# Patient Record
Sex: Female | Born: 1970 | Race: Black or African American | Hispanic: No | Marital: Married | Smoking: Former smoker
Health system: Southern US, Community
[De-identification: ages and names within clinical notes are randomized; demographics above are authoritative.]

## PROBLEM LIST (undated history)

## (undated) DIAGNOSIS — E785 Hyperlipidemia, unspecified: Secondary | ICD-10-CM

## (undated) DIAGNOSIS — I10 Essential (primary) hypertension: Secondary | ICD-10-CM

## (undated) DIAGNOSIS — T7840XA Allergy, unspecified, initial encounter: Secondary | ICD-10-CM

## (undated) DIAGNOSIS — E1140 Type 2 diabetes mellitus with diabetic neuropathy, unspecified: Secondary | ICD-10-CM

## (undated) DIAGNOSIS — N183 Chronic kidney disease, stage 3 unspecified: Secondary | ICD-10-CM

## (undated) DIAGNOSIS — R519 Headache, unspecified: Secondary | ICD-10-CM

## (undated) DIAGNOSIS — J189 Pneumonia, unspecified organism: Secondary | ICD-10-CM

## (undated) DIAGNOSIS — R51 Headache: Secondary | ICD-10-CM

## (undated) DIAGNOSIS — J45909 Unspecified asthma, uncomplicated: Secondary | ICD-10-CM

## (undated) DIAGNOSIS — M1990 Unspecified osteoarthritis, unspecified site: Secondary | ICD-10-CM

## (undated) DIAGNOSIS — F32A Depression, unspecified: Secondary | ICD-10-CM

## (undated) DIAGNOSIS — N289 Disorder of kidney and ureter, unspecified: Secondary | ICD-10-CM

## (undated) DIAGNOSIS — E119 Type 2 diabetes mellitus without complications: Secondary | ICD-10-CM

## (undated) DIAGNOSIS — G510 Bell's palsy: Secondary | ICD-10-CM

## (undated) DIAGNOSIS — F419 Anxiety disorder, unspecified: Secondary | ICD-10-CM

## (undated) DIAGNOSIS — F329 Major depressive disorder, single episode, unspecified: Secondary | ICD-10-CM

## (undated) HISTORY — PX: ECTOPIC PREGNANCY SURGERY: SHX613

## (undated) HISTORY — PX: TUBAL LIGATION: SHX77

## (undated) HISTORY — DX: Depression, unspecified: F32.A

## (undated) HISTORY — DX: Disorder of kidney and ureter, unspecified: N28.9

## (undated) HISTORY — DX: Major depressive disorder, single episode, unspecified: F32.9

## (undated) HISTORY — DX: Unspecified osteoarthritis, unspecified site: M19.90

## (undated) HISTORY — DX: Allergy, unspecified, initial encounter: T78.40XA

## (undated) HISTORY — DX: Unspecified asthma, uncomplicated: J45.909

## (undated) HISTORY — PX: OVARIAN CYST REMOVAL: SHX89

## (undated) HISTORY — DX: Hyperlipidemia, unspecified: E78.5

## (undated) HISTORY — DX: Bell's palsy: G51.0

## (undated) HISTORY — DX: Anxiety disorder, unspecified: F41.9

---

## 2010-01-03 DIAGNOSIS — G510 Bell's palsy: Secondary | ICD-10-CM

## 2010-01-03 HISTORY — DX: Bell's palsy: G51.0

## 2012-01-09 ENCOUNTER — Emergency Department (HOSPITAL_COMMUNITY)
Admission: EM | Admit: 2012-01-09 | Discharge: 2012-01-09 | Disposition: A | Discharge status: Home / Self Care | Payer: 59 | Attending: Emergency Medicine | Admitting: Emergency Medicine

## 2012-01-09 ENCOUNTER — Encounter (HOSPITAL_COMMUNITY): Payer: Self-pay | Admitting: Cardiology

## 2012-01-09 ENCOUNTER — Emergency Department (HOSPITAL_COMMUNITY): Payer: 59

## 2012-01-09 DIAGNOSIS — R0789 Other chest pain: Secondary | ICD-10-CM | POA: Insufficient documentation

## 2012-01-09 DIAGNOSIS — Z79899 Other long term (current) drug therapy: Secondary | ICD-10-CM | POA: Insufficient documentation

## 2012-01-09 DIAGNOSIS — K5909 Other constipation: Secondary | ICD-10-CM | POA: Insufficient documentation

## 2012-01-09 DIAGNOSIS — F17200 Nicotine dependence, unspecified, uncomplicated: Secondary | ICD-10-CM | POA: Insufficient documentation

## 2012-01-09 DIAGNOSIS — I10 Essential (primary) hypertension: Secondary | ICD-10-CM | POA: Insufficient documentation

## 2012-01-09 DIAGNOSIS — Z3202 Encounter for pregnancy test, result negative: Secondary | ICD-10-CM | POA: Insufficient documentation

## 2012-01-09 DIAGNOSIS — R110 Nausea: Secondary | ICD-10-CM | POA: Insufficient documentation

## 2012-01-09 DIAGNOSIS — E119 Type 2 diabetes mellitus without complications: Secondary | ICD-10-CM | POA: Insufficient documentation

## 2012-01-09 DIAGNOSIS — R109 Unspecified abdominal pain: Secondary | ICD-10-CM

## 2012-01-09 HISTORY — DX: Type 2 diabetes mellitus without complications: E11.9

## 2012-01-09 HISTORY — DX: Essential (primary) hypertension: I10

## 2012-01-09 LAB — URINALYSIS, ROUTINE W REFLEX MICROSCOPIC
Bilirubin Urine: NEGATIVE
Ketones, ur: NEGATIVE mg/dL
Nitrite: NEGATIVE
Protein, ur: NEGATIVE mg/dL
Urobilinogen, UA: 0.2 mg/dL (ref 0.0–1.0)

## 2012-01-09 LAB — CBC
MCH: 28.1 pg (ref 26.0–34.0)
MCV: 83.4 fL (ref 78.0–100.0)
Platelets: 332 10*3/uL (ref 150–400)
RBC: 4.41 MIL/uL (ref 3.87–5.11)

## 2012-01-09 LAB — HEPATIC FUNCTION PANEL
ALT: 10 U/L (ref 0–35)
Alkaline Phosphatase: 72 U/L (ref 39–117)
Bilirubin, Direct: <0.1 mg/dL (ref 0.0–0.3)

## 2012-01-09 LAB — BASIC METABOLIC PANEL
CO2: 27 mEq/L (ref 19–32)
Calcium: 9.8 mg/dL (ref 8.4–10.5)
Creatinine, Ser: 1.2 mg/dL — ABNORMAL HIGH (ref 0.50–1.10)
Glucose, Bld: 145 mg/dL — ABNORMAL HIGH (ref 70–99)

## 2012-01-09 LAB — LIPASE, BLOOD: Lipase: 36 U/L (ref 11–59)

## 2012-01-09 MED ORDER — OMEPRAZOLE 20 MG PO CPDR: 20.0000 mg | DELAYED_RELEASE_CAPSULE | Freq: Every day | ORAL | Status: DC

## 2012-01-09 MED ORDER — GI COCKTAIL ~~LOC~~
30.0000 mL | Freq: Once | ORAL | Status: AC
  Administered 2012-01-09: 30 mL via ORAL

## 2012-01-09 MED ORDER — SODIUM CHLORIDE 0.9 % IV BOLUS (SEPSIS)
1000.0000 mL | Freq: Once | INTRAVENOUS | Status: AC
  Administered 2012-01-09: 1000 mL via INTRAVENOUS

## 2012-01-09 MED ORDER — PANTOPRAZOLE SODIUM 40 MG IV SOLR
40.0000 mg | Freq: Once | INTRAVENOUS | Status: AC
  Administered 2012-01-09: 40 mg via INTRAVENOUS
  Filled 2012-01-09: qty 40

## 2012-01-09 NOTE — ED Provider Notes (Signed)
History     CSN: [id number]  Arrival date & time 01/09/12  1318   First MD Initiated Contact with Patient 01/09/12 1738      Chief Complaint  Patient presents with  . Abdominal Pain    (Consider location/radiation/quality/duration/timing/severity/associated sxs/prior treatment) HPI  Renee James is a 42 y.o. female complaining of burning sensation in lower left chest onset yesterday, happening intermittently, with nausea. Denies fever, patient has chronic constipation she took Dulcolax 2 days ago has not had a bowel movement since that time. She denies fever, emesis, shortness of breath, palpitations, change in bladder habits.   Past Medical History  Diagnosis Date  . Hypertension   . Diabetes mellitus without complication     History reviewed. No pertinent past surgical history.  History reviewed. No pertinent family history.  History  Substance Use Topics  . Smoking status: Current Every Day Smoker  . Smokeless tobacco: Not on file  . Alcohol Use: Yes    OB History    Grav Para Term Preterm Abortions TAB SAB Ect Mult Living                  Review of Systems  Constitutional: Negative for fever.  Respiratory: Negative for shortness of breath.   Cardiovascular: Negative for chest pain.  Gastrointestinal: Negative for nausea, vomiting, abdominal pain and diarrhea.  All other systems reviewed and are negative.    Allergies  Latex; Ace inhibitors; and Tylox  Home Medications   Current Outpatient Rx  Name  Route  Sig  Dispense  Refill  . LIPITOR PO   Oral   Take 1 tablet by mouth daily.         Marland Kitchen LAXATIVE PO   Oral   Take 4 capsules by mouth daily as needed. For stomach and intestine discomfort.         Marland Kitchen CITALOPRAM HYDROBROMIDE 10 MG PO TABS   Oral   Take 10 mg by mouth daily.         Marland Kitchen GLIMEPIRIDE 1 MG PO TABS   Oral   Take 1 mg by mouth daily.         Marland Kitchen HYDROCHLOROTHIAZIDE 25 MG PO TABS   Oral   Take 25 mg by mouth daily.         .  IBUPROFEN 800 MG PO TABS   Oral   Take 800 mg by mouth every 8 (eight) hours as needed. For headache         . LOSARTAN POTASSIUM 100 MG PO TABS   Oral   Take 100 mg by mouth daily.         Marland Kitchen METFORMIN HCL 500 MG PO TABS   Oral   Take 1,000 mg by mouth daily.           BP 106/50  Pulse 61  Temp 98.9 F (37.2 C) (Oral)  Resp 18  SpO2 99%  Physical Exam  Nursing note and vitals reviewed. Constitutional: She is oriented to person, place, and time. She appears well-developed and well-nourished. No distress.  HENT:  Head: Normocephalic and atraumatic.  Mouth/Throat: Oropharynx is clear and moist.  Eyes: Conjunctivae normal and EOM are normal. Pupils are equal, round, and reactive to light.  Neck: Normal range of motion.  Cardiovascular: Normal rate, regular rhythm and intact distal pulses.   Pulmonary/Chest: Effort normal and breath sounds normal. No stridor. No respiratory distress. She has no wheezes. She has no rales. She exhibits tenderness.  Mild tenderness to palpation of left lower anterior chest.  Abdominal: Soft. Bowel sounds are normal. She exhibits no distension and no mass. There is no tenderness. There is no rebound and no guarding.  Musculoskeletal: Normal range of motion.  Neurological: She is alert and oriented to person, place, and time.  Skin: Skin is warm.  Psychiatric: She has a normal mood and affect.    ED Course  Procedures (including critical care time)  Labs Reviewed  CBC - Abnormal; Notable for the following:    WBC 11.6 (*)     All other components within normal limits  BASIC METABOLIC PANEL - Abnormal; Notable for the following:    Glucose, Bld 145 (*)     Creatinine, Ser 1.20 (*)     GFR calc non Af Amer 55 (*)     GFR calc Af Amer 63 (*)     All other components within normal limits   Dg Abd Acute W/chest  01/09/2012  *RADIOLOGY REPORT*  Clinical Data: Mid chest pain, left upper quadrant discomfort.  ACUTE ABDOMEN SERIES  (ABDOMEN 2 VIEW & CHEST 1 VIEW)  Comparison:  None.  Findings:  There is no evidence of dilated bowel loops or free intraperitoneal air.  No radiopaque calculi or other significant radiographic abnormality is seen. Heart size and mediastinal contours are within normal limits.  Both lungs are clear.  IMPRESSION: Negative abdominal radiographs.  No acute cardiopulmonary disease.   Original Report Authenticated By: Davonna Belling, M.D.     Date: 01/09/2012  Rate: 65  Rhythm: normal sinus rhythm  QRS Axis: normal  Intervals: normal  ST/T Wave abnormalities: normal  Conduction Disutrbances:none  Narrative Interpretation:   Old EKG Reviewed: unchanged  1. Abdominal pain       MDM  Doubt ACS based on history of present illness and length of symptoms. EKG is nonischemic and troponin is negative. D-dimer is negative and blood work and urinalysis are also normal. Patient has complete resolution of her symptoms after Protonix and GI cocktail. VSS.    Pt verbalized understanding and agrees with care plan. Outpatient follow-up and return precautions given.    New Prescriptions   OMEPRAZOLE (PRILOSEC) 20 MG CAPSULE    Take 1 capsule (20 mg total) by mouth daily.         Wynetta Emery, PA-C 01/10/12 1214

## 2012-01-09 NOTE — ED Notes (Signed)
Pt reports pain under the left breast that started yesterday that is non-radiating. States she has been taking tums for the pain, without much relief. Denies any SOB, or n/v.

## 2012-01-11 NOTE — ED Provider Notes (Signed)
Medical screening examination/treatment/procedure(s) were performed by non-physician practitioner and as supervising physician I was immediately available for consultation/collaboration.   Laray Anger, DO 01/11/12 1051

## 2012-03-05 ENCOUNTER — Other Ambulatory Visit: Payer: Self-pay | Admitting: Family Medicine

## 2012-03-05 DIAGNOSIS — Z1231 Encounter for screening mammogram for malignant neoplasm of breast: Secondary | ICD-10-CM

## 2012-03-20 ENCOUNTER — Ambulatory Visit: Payer: 59

## 2012-03-22 ENCOUNTER — Ambulatory Visit
Admission: RE | Admit: 2012-03-22 | Discharge: 2012-03-22 | Disposition: A | Discharge status: Home / Self Care | Payer: 59 | Source: Ambulatory Visit | Attending: Family Medicine | Admitting: Family Medicine

## 2012-03-22 DIAGNOSIS — Z1231 Encounter for screening mammogram for malignant neoplasm of breast: Secondary | ICD-10-CM

## 2012-07-23 ENCOUNTER — Encounter (HOSPITAL_BASED_OUTPATIENT_CLINIC_OR_DEPARTMENT_OTHER): Payer: Self-pay | Admitting: Family Medicine

## 2012-07-23 ENCOUNTER — Emergency Department (HOSPITAL_BASED_OUTPATIENT_CLINIC_OR_DEPARTMENT_OTHER)
Admission: EM | Admit: 2012-07-23 | Discharge: 2012-07-23 | Disposition: A | Discharge status: Home / Self Care | Payer: 59 | Attending: Emergency Medicine | Admitting: Emergency Medicine

## 2012-07-23 DIAGNOSIS — Z91040 Latex allergy status: Secondary | ICD-10-CM | POA: Insufficient documentation

## 2012-07-23 DIAGNOSIS — Z79899 Other long term (current) drug therapy: Secondary | ICD-10-CM | POA: Insufficient documentation

## 2012-07-23 DIAGNOSIS — I10 Essential (primary) hypertension: Secondary | ICD-10-CM | POA: Insufficient documentation

## 2012-07-23 DIAGNOSIS — E119 Type 2 diabetes mellitus without complications: Secondary | ICD-10-CM | POA: Insufficient documentation

## 2012-07-23 DIAGNOSIS — F17200 Nicotine dependence, unspecified, uncomplicated: Secondary | ICD-10-CM | POA: Insufficient documentation

## 2012-07-23 DIAGNOSIS — B86 Scabies: Secondary | ICD-10-CM | POA: Insufficient documentation

## 2012-07-23 DIAGNOSIS — L509 Urticaria, unspecified: Secondary | ICD-10-CM | POA: Insufficient documentation

## 2012-07-23 MED ORDER — PREDNISONE 20 MG PO TABS: ORAL_TABLET | ORAL | Status: DC

## 2012-07-23 MED ORDER — PREDNISONE 50 MG PO TABS
60.0000 mg | ORAL_TABLET | Freq: Once | ORAL | Status: AC
  Administered 2012-07-23: 60 mg via ORAL
  Filled 2012-07-23: qty 1

## 2012-07-23 MED ORDER — PERMETHRIN 5 % EX CREA: TOPICAL_CREAM | CUTANEOUS | Status: DC

## 2012-07-23 MED ORDER — HYDROXYZINE HCL 25 MG PO TABS: 25.0000 mg | ORAL_TABLET | Freq: Four times a day (QID) | ORAL | Status: DC

## 2012-07-23 NOTE — ED Provider Notes (Signed)
History    This chart was scribed for a non-physician practitioner working with Rolan Bucco, MD by Jiles Prows, ED scribe. This patient was seen in room MH07/MH07 and the patient's care was started at 3:24 PM.  CSN: [id number] Arrival date & time 07/23/12  1502   Chief Complaint  Patient presents with  . Urticaria   Patient is a 42 y.o. female presenting with urticaria. The history is provided by the patient, medical records and the spouse. No language interpreter was used.  Urticaria This is a new problem. The current episode started 6 to 12 hours ago. The problem occurs constantly. The problem has been gradually worsening. Pertinent negatives include no chest pain, no abdominal pain, no headaches and no shortness of breath. The treatment provided no relief.   HPI Comments: Renee James is a 42 y.o. female who presents to the Emergency Department complaining of sudden, numerous, small insect bites to arms, hands, and ear lobes onset Thursday.  She reports hives appeared today over torso, feet, and arms today.  Pt reports benadryl has offered no relief.  Pt states that she recently travelled to [place], [place], and [place].  She claims the bumps appeared while she was in [place].  Pt denies headache, diaphoresis, fever, chills, nausea, vomiting, diarrhea, weakness, cough, SOB and any other pain.  Husband denies symptoms.  She claims allergy to latex and states she has had reactions in the past due to this irritant.  Past Medical History  Diagnosis Date  . Hypertension   . Diabetes mellitus without complication    History reviewed. No pertinent past surgical history. No family history on file. History  Substance Use Topics  . Smoking status: Current Every Day Smoker  . Smokeless tobacco: Not on file  . Alcohol Use: Yes   OB History   Grav Para Term Preterm Abortions TAB SAB Ect Mult Living                 Review of Systems  Constitutional: Negative for fever, chills, diaphoresis  and fatigue.  HENT: Negative for congestion, rhinorrhea and sneezing.   Eyes: Negative.   Respiratory: Negative for cough, chest tightness and shortness of breath.   Cardiovascular: Negative for chest pain and leg swelling.  Gastrointestinal: Negative for nausea, vomiting, abdominal pain, diarrhea and blood in stool.  Genitourinary: Negative for frequency, hematuria, flank pain and difficulty urinating.  Musculoskeletal: Negative for back pain and arthralgias.  Skin: Positive for rash.  Neurological: Negative for dizziness, speech difficulty, weakness, numbness and headaches.  All other systems reviewed and are negative.    Allergies  Latex; Ace inhibitors; and Tylox  Home Medications   Current Outpatient Rx  Name  Route  Sig  Dispense  Refill  . Atorvastatin Calcium (LIPITOR PO)   Oral   Take 1 tablet by mouth daily.         . Bisacodyl (LAXATIVE PO)   Oral   Take 4 capsules by mouth daily as needed. For stomach and intestine discomfort.         . citalopram (CELEXA) 10 MG tablet   Oral   Take 10 mg by mouth daily.         Marland Kitchen glimepiride (AMARYL) 1 MG tablet   Oral   Take 1 mg by mouth daily.         . hydrochlorothiazide (HYDRODIURIL) 25 MG tablet   Oral   Take 25 mg by mouth daily.         Marland Kitchen  hydrOXYzine (ATARAX/VISTARIL) 25 MG tablet   Oral   Take 1 tablet (25 mg total) by mouth every 6 (six) hours.   12 tablet   0   . ibuprofen (ADVIL,MOTRIN) 800 MG tablet   Oral   Take 800 mg by mouth every 8 (eight) hours as needed. For headache         . losartan (COZAAR) 100 MG tablet   Oral   Take 100 mg by mouth daily.         . metFORMIN (GLUCOPHAGE) 500 MG tablet   Oral   Take 1,000 mg by mouth daily.         Marland Kitchen omeprazole (PRILOSEC) 20 MG capsule   Oral   Take 1 capsule (20 mg total) by mouth daily.   20 capsule   0   . permethrin (ELIMITE) 5 % cream      Apply to affected area once   60 g   0   . predniSONE (DELTASONE) 20 MG tablet       3 tabs po day one, then 2 po daily x 4 days   11 tablet   0    BP 116/92  Pulse 94  Temp(Src) 98.8 F (37.1 C) (Oral)  Resp 16  Ht 5\' 3"  (1.6 m)  Wt 195 lb (88.451 kg)  BMI 34.55 kg/m2  SpO2 100%  LMP 07/09/2012 Physical Exam  Constitutional: She is oriented to person, place, and time. She appears well-developed and well-nourished.  HENT:  Head: Normocephalic and atraumatic.  Mouth/Throat: Oropharynx is clear and moist.  Eyes: Pupils are equal, round, and reactive to light.  Neck: Normal range of motion. Neck supple.  Cardiovascular: Normal rate, regular rhythm and normal heart sounds.   Pulmonary/Chest: Effort normal and breath sounds normal. No respiratory distress. She has no wheezes. She has no rales. She exhibits no tenderness.  Abdominal: Soft. Bowel sounds are normal. There is no tenderness. There is no rebound and no guarding.  Musculoskeletal: Normal range of motion. She exhibits no edema.  Lymphadenopathy:    She has no cervical adenopathy.  Neurological: She is alert and oriented to person, place, and time.  Skin: Skin is warm and dry. Rash noted.  Small papules to dorsum of hands, forearms.  +blanching hives to arms/trunck.  No petechiae or purpura, no vesicles.  Psychiatric: She has a normal mood and affect.    ED Course  Procedures (including critical care time) DIAGNOSTIC STUDIES: Oxygen Saturation is 100% on RA, normal by my interpretation.    COORDINATION OF CARE: 3:27 PM - Discussed ED treatment with pt at bedside including prednisone treatment, scabies medication, and Atarax and pt agrees.     Labs Reviewed - No data to display No results found. 1. Urticaria   2. Scabies     MDM  Pt with small papules that look concerning for scabies.   Will treat with elimite. Advised to wash clothes/bedsheets.  Also with urticaria.  No angioedema or respiratory difficulty.  No fevers or other systemic dz.  No new meds.  Will tx with prednisone, atarax.   Advised to monitor her blood sugars while on prednisone  I personally performed the services described in this documentation, which was scribed in my presence.  The recorded information has been reviewed and considered.    Rolan Bucco, MD 07/23/12 [phone]

## 2012-07-23 NOTE — ED Notes (Signed)
Pt c/o hives all over x 2 days.

## 2013-01-04 ENCOUNTER — Encounter (HOSPITAL_BASED_OUTPATIENT_CLINIC_OR_DEPARTMENT_OTHER): Payer: Self-pay | Admitting: Emergency Medicine

## 2013-01-04 DIAGNOSIS — I10 Essential (primary) hypertension: Secondary | ICD-10-CM | POA: Insufficient documentation

## 2013-01-04 DIAGNOSIS — L299 Pruritus, unspecified: Secondary | ICD-10-CM | POA: Insufficient documentation

## 2013-01-04 DIAGNOSIS — E119 Type 2 diabetes mellitus without complications: Secondary | ICD-10-CM | POA: Insufficient documentation

## 2013-01-04 DIAGNOSIS — Z79899 Other long term (current) drug therapy: Secondary | ICD-10-CM | POA: Insufficient documentation

## 2013-01-04 DIAGNOSIS — Z91040 Latex allergy status: Secondary | ICD-10-CM | POA: Insufficient documentation

## 2013-01-04 DIAGNOSIS — F17200 Nicotine dependence, unspecified, uncomplicated: Secondary | ICD-10-CM | POA: Insufficient documentation

## 2013-01-04 MED ORDER — DIPHENHYDRAMINE HCL 25 MG PO CAPS
ORAL_CAPSULE | ORAL | Status: AC
  Administered 2013-01-04: 50 mg via ORAL
  Filled 2013-01-04: qty 2

## 2013-01-04 MED ORDER — DIPHENHYDRAMINE HCL 25 MG PO CAPS
50.0000 mg | ORAL_CAPSULE | Freq: Once | ORAL | Status: AC
  Administered 2013-01-04: 50 mg via ORAL

## 2013-01-04 NOTE — ED Notes (Signed)
Hives since yesterday. Last dose of Benadryl was at noon. Denies difficulty breathing or sore throat.

## 2013-01-05 ENCOUNTER — Emergency Department (HOSPITAL_BASED_OUTPATIENT_CLINIC_OR_DEPARTMENT_OTHER)
Admission: EM | Admit: 2013-01-05 | Discharge: 2013-01-05 | Disposition: A | Discharge status: Home / Self Care | Payer: 59 | Attending: Emergency Medicine | Admitting: Emergency Medicine

## 2013-01-05 DIAGNOSIS — T7840XA Allergy, unspecified, initial encounter: Secondary | ICD-10-CM

## 2013-01-05 MED ORDER — PREDNISONE 20 MG PO TABS: ORAL_TABLET | ORAL | Status: DC

## 2013-01-05 MED ORDER — PREDNISONE 50 MG PO TABS
60.0000 mg | ORAL_TABLET | Freq: Once | ORAL | Status: AC
  Administered 2013-01-05: 60 mg via ORAL
  Filled 2013-01-05 (×2): qty 1

## 2013-01-05 MED ORDER — FAMOTIDINE 20 MG PO TABS
20.0000 mg | ORAL_TABLET | Freq: Once | ORAL | Status: AC
  Administered 2013-01-05: 20 mg via ORAL
  Filled 2013-01-05: qty 1

## 2013-01-05 NOTE — Discharge Instructions (Signed)
Allergies °Allergies may happen from anything your body is sensitive to. This may be food, medicines, pollens, chemicals, and nearly anything around you in everyday life that produces allergens. An allergen is anything that causes an allergy producing substance. Heredity is often a factor in causing these problems. This means you may have some of the same allergies as your parents. °Food allergies happen in all age groups. Food allergies are some of the most severe and life threatening. Some common food allergies are cow's milk, seafood, eggs, nuts, wheat, and soybeans. °SYMPTOMS  °· Swelling around the mouth. °· An itchy red rash or hives. °· Vomiting or diarrhea. °· Difficulty breathing. °SEVERE ALLERGIC REACTIONS ARE LIFE-THREATENING. °This reaction is called anaphylaxis. It can cause the mouth and throat to swell and cause difficulty with breathing and swallowing. In severe reactions only a trace amount of food (for example, peanut oil in a salad) may cause death within seconds. °Seasonal allergies occur in all age groups. These are seasonal because they usually occur during the same season every year. They may be a reaction to molds, grass pollens, or tree pollens. Other causes of problems are house dust mite allergens, pet dander, and mold spores. The symptoms often consist of nasal congestion, a runny itchy nose associated with sneezing, and tearing itchy eyes. There is often an associated itching of the mouth and ears. The problems happen when you come in contact with pollens and other allergens. Allergens are the particles in the air that the body reacts to with an allergic reaction. This causes you to release allergic antibodies. Through a chain of events, these eventually cause you to release histamine into the blood stream. Although it is meant to be protective to the body, it is this release that causes your discomfort. This is why you were given anti-histamines to feel better.  If you are unable to  pinpoint the offending allergen, it may be determined by skin or blood testing. Allergies cannot be cured but can be controlled with medicine. °Hay fever is a collection of all or some of the seasonal allergy problems. It may often be treated with simple over-the-counter medicine such as diphenhydramine. Take medicine as directed. Do not drink alcohol or drive while taking this medicine. Check with your caregiver or package insert for child dosages. °If these medicines are not effective, there are many new medicines your caregiver can prescribe. Stronger medicine such as nasal spray, eye drops, and corticosteroids may be used if the first things you try do not work well. Other treatments such as immunotherapy or desensitizing injections can be used if all else fails. Follow up with your caregiver if problems continue. These seasonal allergies are usually not life threatening. They are generally more of a nuisance that can often be handled using medicine. °HOME CARE INSTRUCTIONS  °· If unsure what causes a reaction, keep a diary of foods eaten and symptoms that follow. Avoid foods that cause reactions. °· If hives or rash are present: °· Take medicine as directed. °· You may use an over-the-counter antihistamine (diphenhydramine) for hives and itching as needed. °· Apply cold compresses (cloths) to the skin or take baths in cool water. Avoid hot baths or showers. Heat will make a rash and itching worse. °· If you are severely allergic: °· Following a treatment for a severe reaction, hospitalization is often required for closer follow-up. °· Wear a medic-alert bracelet or necklace stating the allergy. °· You and your family must learn how to give adrenaline or use   an anaphylaxis kit. °· If you have had a severe reaction, always carry your anaphylaxis kit or EpiPen® with you. Use this medicine as directed by your caregiver if a severe reaction is occurring. Failure to do so could have a fatal outcome. °SEEK MEDICAL  CARE IF: °· You suspect a food allergy. Symptoms generally happen within 30 minutes of eating a food. °· Your symptoms have not gone away within 2 days or are getting worse. °· You develop new symptoms. °· You want to retest yourself or your child with a food or drink you think causes an allergic reaction. Never do this if an anaphylactic reaction to that food or drink has happened before. Only do this under the care of a caregiver. °SEEK IMMEDIATE MEDICAL CARE IF:  °· You have difficulty breathing, are wheezing, or have a tight feeling in your chest or throat. °· You have a swollen mouth, or you have hives, swelling, or itching all over your body. °· You have had a severe reaction that has responded to your anaphylaxis kit or an EpiPen®. These reactions may return when the medicine has worn off. These reactions should be considered life threatening. °MAKE SURE YOU:  °· Understand these instructions. °· Will watch your condition. °· Will get help right away if you are not doing well or get worse. °Document Released: 03/15/2002 Document Revised: 04/16/2012 Document Reviewed: 08/20/2007 °[employer]® Patient Information ©2014 [employer]. ° °

## 2013-01-05 NOTE — ED Provider Notes (Signed)
CSN: [id number]     Arrival date & time 01/04/13  2241 History   First MD Initiated Contact with Patient 01/05/13 0118     Chief Complaint  Patient presents with  . Allergic Reaction   (Consider location/radiation/quality/duration/timing/severity/associated sxs/prior Treatment) HPI Patient has had pruritic rash yesterday and worsening today. This is located on her trunk and extremities. She has no intraoral or facial swelling. She denies any shortness of breath or wheezing. Patient has similar episode 6 months ago associated with any particular type of Gumbo. Patient states she recently had the same type of gumbo prior to the onset of her symptoms. She's taken Benadryl with little improvement. She denies any new detergents or soaps. Past Medical History  Diagnosis Date  . Hypertension   . Diabetes mellitus without complication    History reviewed. No pertinent past surgical history. No family history on file. History  Substance Use Topics  . Smoking status: Current Every Day Smoker  . Smokeless tobacco: Not on file  . Alcohol Use: Yes   OB History   Grav Para Term Preterm Abortions TAB SAB Ect Mult Living                 Review of Systems  Constitutional: Negative for fever and chills.  HENT: Negative for facial swelling, trouble swallowing and voice change.   Respiratory: Negative for shortness of breath, wheezing and stridor.   Cardiovascular: Negative for chest pain.  Gastrointestinal: Negative for nausea, vomiting, abdominal pain, diarrhea and constipation.  Musculoskeletal: Negative for back pain, myalgias, neck pain and neck stiffness.  Skin: Positive for rash. Negative for color change, pallor and wound.  Neurological: Negative for dizziness, syncope, weakness, numbness and headaches.  All other systems reviewed and are negative.    Allergies  Latex; Ace inhibitors; and Tylox  Home Medications   Current Outpatient Rx  Name  Route  Sig  Dispense  Refill  .  Atorvastatin Calcium (LIPITOR PO)   Oral   Take 1 tablet by mouth daily.         . Bisacodyl (LAXATIVE PO)   Oral   Take 4 capsules by mouth daily as needed. For stomach and intestine discomfort.         . citalopram (CELEXA) 10 MG tablet   Oral   Take 10 mg by mouth daily.         Marland Kitchen glimepiride (AMARYL) 1 MG tablet   Oral   Take 1 mg by mouth daily.         . hydrochlorothiazide (HYDRODIURIL) 25 MG tablet   Oral   Take 25 mg by mouth daily.         . hydrOXYzine (ATARAX/VISTARIL) 25 MG tablet   Oral   Take 1 tablet (25 mg total) by mouth every 6 (six) hours.   12 tablet   0   . ibuprofen (ADVIL,MOTRIN) 800 MG tablet   Oral   Take 800 mg by mouth every 8 (eight) hours as needed. For headache         . losartan (COZAAR) 100 MG tablet   Oral   Take 100 mg by mouth daily.         . metFORMIN (GLUCOPHAGE) 500 MG tablet   Oral   Take 1,000 mg by mouth daily.         Marland Kitchen omeprazole (PRILOSEC) 20 MG capsule   Oral   Take 1 capsule (20 mg total) by mouth daily.   20 capsule  0   . permethrin (ELIMITE) 5 % cream      Apply to affected area once   60 g   0   . predniSONE (DELTASONE) 20 MG tablet      3 tabs po day one, then 2 po daily x 4 days   11 tablet   0   . predniSONE (DELTASONE) 20 MG tablet      2 tabs po daily x 4 days   8 tablet   0    BP 130/85  Pulse 88  Temp(Src) 98.6 F (37 C) (Oral)  Resp 18  Ht 5\' 3"  (1.6 m)  Wt 187 lb (84.823 kg)  BMI 33.13 kg/m2  SpO2 99% Physical Exam  Nursing note and vitals reviewed. Constitutional: She is oriented to person, place, and time. She appears well-developed and well-nourished. No distress.  HENT:  Head: Normocephalic and atraumatic.  Mouth/Throat: Oropharynx is clear and moist.  No intraoral swelling.  Eyes: EOM are normal. Pupils are equal, round, and reactive to light.  Neck: Normal range of motion. Neck supple. No tracheal deviation present.  Cardiovascular: Normal rate and  regular rhythm.  Gallop: rash consistent with urticaria to bilateral upper extremities and upper back.   Pulmonary/Chest: Effort normal and breath sounds normal. No stridor. No respiratory distress. She has no wheezes. She has no rales.  Abdominal: Soft. Bowel sounds are normal. She exhibits no distension and no mass. There is no tenderness. There is no rebound and no guarding.  Musculoskeletal: Normal range of motion. She exhibits no edema and no tenderness.  Neurological: She is alert and oriented to person, place, and time.  Patient is alert and oriented x3 with clear, goal oriented speech. Patient has 5/5 motor in all extremities. Sensation is intact to light touch. Patient has a normal gait and walks without assistance.   Skin: Skin is warm and dry. Rash noted. No erythema.  Psychiatric: She has a normal mood and affect. Her behavior is normal.    ED Course  Procedures (including critical care time) Labs Review Labs Reviewed - No data to display Imaging Review No results found.  EKG Interpretation   None       MDM   1. Allergic reaction, initial encounter    Patient continues to be well-appearing in emergency department. We'll give a short course of steroids. She's been advised to avoid this particular type of food and follow with her primary Dr. Return precautions have been given.    Julianne Rice, MD 01/05/13 [phone]

## 2013-07-16 ENCOUNTER — Emergency Department (HOSPITAL_BASED_OUTPATIENT_CLINIC_OR_DEPARTMENT_OTHER)
Admission: EM | Admit: 2013-07-16 | Discharge: 2013-07-16 | Disposition: A | Discharge status: Home / Self Care | Payer: 59 | Attending: Emergency Medicine | Admitting: Emergency Medicine

## 2013-07-16 ENCOUNTER — Emergency Department (HOSPITAL_BASED_OUTPATIENT_CLINIC_OR_DEPARTMENT_OTHER): Payer: 59

## 2013-07-16 ENCOUNTER — Encounter (HOSPITAL_BASED_OUTPATIENT_CLINIC_OR_DEPARTMENT_OTHER): Payer: Self-pay | Admitting: Emergency Medicine

## 2013-07-16 DIAGNOSIS — Z91040 Latex allergy status: Secondary | ICD-10-CM | POA: Insufficient documentation

## 2013-07-16 DIAGNOSIS — IMO0002 Reserved for concepts with insufficient information to code with codable children: Secondary | ICD-10-CM | POA: Insufficient documentation

## 2013-07-16 DIAGNOSIS — J189 Pneumonia, unspecified organism: Secondary | ICD-10-CM

## 2013-07-16 DIAGNOSIS — I10 Essential (primary) hypertension: Secondary | ICD-10-CM | POA: Insufficient documentation

## 2013-07-16 DIAGNOSIS — F17200 Nicotine dependence, unspecified, uncomplicated: Secondary | ICD-10-CM | POA: Insufficient documentation

## 2013-07-16 DIAGNOSIS — J159 Unspecified bacterial pneumonia: Secondary | ICD-10-CM | POA: Insufficient documentation

## 2013-07-16 DIAGNOSIS — Z79899 Other long term (current) drug therapy: Secondary | ICD-10-CM | POA: Insufficient documentation

## 2013-07-16 DIAGNOSIS — E119 Type 2 diabetes mellitus without complications: Secondary | ICD-10-CM | POA: Insufficient documentation

## 2013-07-16 MED ORDER — AZITHROMYCIN 250 MG PO TABS: 250.0000 mg | ORAL_TABLET | Freq: Every day | ORAL | Status: DC

## 2013-07-16 MED ORDER — DEXTROSE 5 % IV SOLN: 1.0000 g | Freq: Once | INTRAVENOUS | Status: DC

## 2013-07-16 MED ORDER — ALBUTEROL SULFATE (2.5 MG/3ML) 0.083% IN NEBU
2.5000 mg | INHALATION_SOLUTION | Freq: Once | RESPIRATORY_TRACT | Status: AC
Start: 2013-07-16 — End: 2013-07-16
  Administered 2013-07-16: 2.5 mg via RESPIRATORY_TRACT
  Filled 2013-07-16: qty 3

## 2013-07-16 MED ORDER — CEFTRIAXONE SODIUM 1 G IJ SOLR
1.0000 g | Freq: Once | INTRAMUSCULAR | Status: AC
  Administered 2013-07-16: 1 g via INTRAMUSCULAR

## 2013-07-16 MED ORDER — ONDANSETRON 4 MG PO TBDP
ORAL_TABLET | ORAL | Status: AC
  Filled 2013-07-16: qty 1

## 2013-07-16 MED ORDER — IPRATROPIUM-ALBUTEROL 0.5-2.5 (3) MG/3ML IN SOLN
3.0000 mL | Freq: Once | RESPIRATORY_TRACT | Status: AC
Start: 2013-07-16 — End: 2013-07-16
  Administered 2013-07-16: 3 mL via RESPIRATORY_TRACT
  Filled 2013-07-16: qty 3

## 2013-07-16 MED ORDER — ALBUTEROL SULFATE HFA 108 (90 BASE) MCG/ACT IN AERS: 1.0000 | INHALATION_SPRAY | Freq: Four times a day (QID) | RESPIRATORY_TRACT | Status: DC | PRN

## 2013-07-16 MED ORDER — SODIUM CHLORIDE 0.9 % IV SOLN: Freq: Once | INTRAVENOUS | Status: DC

## 2013-07-16 MED ORDER — CEFTRIAXONE SODIUM 1 G IJ SOLR
INTRAMUSCULAR | Status: AC
  Filled 2013-07-16: qty 10

## 2013-07-16 MED ORDER — ONDANSETRON 4 MG PO TBDP
4.0000 mg | ORAL_TABLET | Freq: Once | ORAL | Status: AC
  Administered 2013-07-16: 4 mg via ORAL

## 2013-07-16 MED ORDER — AZITHROMYCIN 250 MG PO TABS
500.0000 mg | ORAL_TABLET | Freq: Once | ORAL | Status: AC
  Administered 2013-07-16: 500 mg via ORAL
  Filled 2013-07-16: qty 2

## 2013-07-16 NOTE — ED Notes (Signed)
Patient transported to X-ray 

## 2013-07-16 NOTE — ED Notes (Signed)
Patient states that she had a sore throat on Sunday and then progressively got worse with a cough. Sore all over.

## 2013-07-16 NOTE — ED Provider Notes (Signed)
CSN: [id number]     Arrival date & time 07/16/13  1906 History   First MD Initiated Contact with Patient 07/16/13 1912     Chief Complaint  Patient presents with  . Cough  . Sore Throat     (Consider location/radiation/quality/duration/timing/severity/associated sxs/prior Treatment) Patient is a 43 y.o. female presenting with pharyngitis. The history is provided by the patient. No language interpreter was used.  Sore Throat This is a new problem. The current episode started today. The problem occurs constantly. The problem has been unchanged. Associated symptoms include congestion, coughing and a sore throat. Nothing aggravates the symptoms. She has tried nothing for the symptoms. The treatment provided no relief.    Past Medical History  Diagnosis Date  . Hypertension   . Diabetes mellitus without complication    History reviewed. No pertinent past surgical history. History reviewed. No pertinent family history. History  Substance Use Topics  . Smoking status: Current Every Day Smoker  . Smokeless tobacco: Not on file  . Alcohol Use: Yes   OB History   Grav Para Term Preterm Abortions TAB SAB Ect Mult Living                 Review of Systems  HENT: Positive for congestion and sore throat.   Respiratory: Positive for cough.   All other systems reviewed and are negative.     Allergies  Latex; Ace inhibitors; and Tylox  Home Medications   Prior to Admission medications   Medication Sig Start Date End Date Taking? Authorizing Provider  Atorvastatin Calcium (LIPITOR PO) Take 1 tablet by mouth daily.   Yes Historical Provider, MD  citalopram (CELEXA) 10 MG tablet Take 10 mg by mouth daily.   Yes Historical Provider, MD  hydrochlorothiazide (HYDRODIURIL) 25 MG tablet Take 25 mg by mouth daily.   Yes Historical Provider, MD  ibuprofen (ADVIL,MOTRIN) 800 MG tablet Take 800 mg by mouth every 8 (eight) hours as needed. For headache   Yes Historical Provider, MD  losartan  (COZAAR) 100 MG tablet Take 100 mg by mouth daily.   Yes Historical Provider, MD  metFORMIN (GLUCOPHAGE) 500 MG tablet Take 1,000 mg by mouth daily.   Yes Historical Provider, MD  Bisacodyl (LAXATIVE PO) Take 4 capsules by mouth daily as needed. For stomach and intestine discomfort.    Historical Provider, MD  glimepiride (AMARYL) 1 MG tablet Take 1 mg by mouth daily.    Historical Provider, MD  hydrOXYzine (ATARAX/VISTARIL) 25 MG tablet Take 1 tablet (25 mg total) by mouth every 6 (six) hours. 07/23/12   Malvin Johns, MD  omeprazole (PRILOSEC) 20 MG capsule Take 1 capsule (20 mg total) by mouth daily. 01/09/12   Nicole Pisciotta, PA-C  permethrin (ELIMITE) 5 % cream Apply to affected area once 07/23/12   Malvin Johns, MD  predniSONE (DELTASONE) 20 MG tablet 3 tabs po day one, then 2 po daily x 4 days 07/23/12   Malvin Johns, MD  predniSONE (DELTASONE) 20 MG tablet 2 tabs po daily x 4 days 01/05/13   Julianne Rice, MD   BP 161/94  Pulse 78  Temp(Src) 100.1 F (37.8 C) (Oral)  Resp 20  Ht 5\' 3"  (1.6 m)  Wt 189 lb (85.73 kg)  BMI 33.49 kg/m2  SpO2 98%  LMP 07/16/2013 Physical Exam  Nursing note and vitals reviewed. Constitutional: She is oriented to person, place, and time. She appears well-developed and well-nourished.  HENT:  Head: Normocephalic.  Right Ear: External ear normal.  Mouth/Throat: Oropharynx is clear and moist.  Eyes: Conjunctivae and EOM are normal. Pupils are equal, round, and reactive to light.  Neck: Normal range of motion.  Cardiovascular: Normal rate.   Pulmonary/Chest: She has wheezes. She exhibits tenderness.  Abdominal: Soft.  Musculoskeletal: Normal range of motion.  Neurological: She is alert and oriented to person, place, and time. She has normal reflexes.  Skin: Skin is warm.  Psychiatric: She has a normal mood and affect.    ED Course  Procedures (including critical care time) Labs Review Labs Reviewed - No data to display  Imaging Review Dg Chest  2 View  07/16/2013   CLINICAL DATA:  Cough and fever  EXAM: CHEST  2 VIEW  COMPARISON:  01/09/2012  FINDINGS: Cardiac shadow is mildly enlarged. The lungs are well aerated bilaterally. Minimal atelectatic changes are noted in the right middle lobe along the minor fissure. No sizable effusion is seen.  IMPRESSION: Minimal right middle lobe atelectasis.   Electronically Signed   By: Inez Catalina M.D.   On: 07/16/2013 19:48     EKG Interpretation None      MDM   Final diagnoses:  Community acquired pneumonia    Pt given rocephin and zithromax.     [place], PA-C 07/16/13 2100

## 2013-07-16 NOTE — ED Provider Notes (Signed)
Medical screening examination/treatment/procedure(s) were performed by non-physician practitioner and as supervising physician I was immediately available for consultation/collaboration.   EKG Interpretation None        Merryl Hacker, MD 07/16/13 2354

## 2013-07-16 NOTE — Discharge Instructions (Signed)

## 2013-07-19 ENCOUNTER — Ambulatory Visit: Payer: 59 | Admitting: *Deleted

## 2013-07-23 ENCOUNTER — Emergency Department (HOSPITAL_BASED_OUTPATIENT_CLINIC_OR_DEPARTMENT_OTHER): Payer: 59

## 2013-07-23 ENCOUNTER — Encounter (HOSPITAL_BASED_OUTPATIENT_CLINIC_OR_DEPARTMENT_OTHER): Payer: Self-pay | Admitting: Emergency Medicine

## 2013-07-23 ENCOUNTER — Emergency Department (HOSPITAL_BASED_OUTPATIENT_CLINIC_OR_DEPARTMENT_OTHER)
Admission: EM | Admit: 2013-07-23 | Discharge: 2013-07-23 | Disposition: A | Discharge status: Home / Self Care | Payer: 59 | Attending: Emergency Medicine | Admitting: Emergency Medicine

## 2013-07-23 DIAGNOSIS — M25552 Pain in left hip: Secondary | ICD-10-CM

## 2013-07-23 DIAGNOSIS — Z91040 Latex allergy status: Secondary | ICD-10-CM | POA: Insufficient documentation

## 2013-07-23 DIAGNOSIS — Z8701 Personal history of pneumonia (recurrent): Secondary | ICD-10-CM | POA: Insufficient documentation

## 2013-07-23 DIAGNOSIS — IMO0002 Reserved for concepts with insufficient information to code with codable children: Secondary | ICD-10-CM | POA: Insufficient documentation

## 2013-07-23 DIAGNOSIS — E119 Type 2 diabetes mellitus without complications: Secondary | ICD-10-CM | POA: Insufficient documentation

## 2013-07-23 DIAGNOSIS — I10 Essential (primary) hypertension: Secondary | ICD-10-CM | POA: Insufficient documentation

## 2013-07-23 DIAGNOSIS — Z792 Long term (current) use of antibiotics: Secondary | ICD-10-CM | POA: Insufficient documentation

## 2013-07-23 DIAGNOSIS — M5410 Radiculopathy, site unspecified: Secondary | ICD-10-CM

## 2013-07-23 DIAGNOSIS — M25559 Pain in unspecified hip: Secondary | ICD-10-CM | POA: Insufficient documentation

## 2013-07-23 DIAGNOSIS — F17200 Nicotine dependence, unspecified, uncomplicated: Secondary | ICD-10-CM | POA: Insufficient documentation

## 2013-07-23 HISTORY — DX: Pneumonia, unspecified organism: J18.9

## 2013-07-23 MED ORDER — DIAZEPAM 5 MG PO TABS: 5.0000 mg | ORAL_TABLET | Freq: Two times a day (BID) | ORAL | Status: DC

## 2013-07-23 MED ORDER — DIAZEPAM 5 MG PO TABS
5.0000 mg | ORAL_TABLET | Freq: Once | ORAL | Status: AC
  Administered 2013-07-23: 5 mg via ORAL
  Filled 2013-07-23: qty 1

## 2013-07-23 MED ORDER — IBUPROFEN 800 MG PO TABS
800.0000 mg | ORAL_TABLET | Freq: Once | ORAL | Status: AC
  Administered 2013-07-23: 800 mg via ORAL
  Filled 2013-07-23: qty 1

## 2013-07-23 MED ORDER — IBUPROFEN 800 MG PO TABS: 800.0000 mg | ORAL_TABLET | Freq: Three times a day (TID) | ORAL | Status: DC

## 2013-07-23 MED ORDER — HYDROCODONE-ACETAMINOPHEN 5-325 MG PO TABS: 1.0000 | ORAL_TABLET | Freq: Four times a day (QID) | ORAL | Status: DC | PRN

## 2013-07-23 NOTE — ED Notes (Signed)
Pt. Reports L hip pain that goes into the L leg.  Pt. Reports she had an injection in the L hip last week for pneumonia.

## 2013-07-23 NOTE — Discharge Instructions (Signed)
Please follow up with your primary care physician in 1-2 days. If you do not have one please call the [employer] number listed above. Please take pain medication and/or muscle relaxants as prescribed and as needed for pain. Please do not drive on narcotic pain medication or on muscle relaxants. Please read all discharge instructions and return precautions.   Sciatica Sciatica is pain, weakness, numbness, or tingling along the path of the sciatic nerve. The nerve starts in the lower back and runs down the back of each leg. The nerve controls the muscles in the lower leg and in the back of the knee, while also providing sensation to the back of the thigh, lower leg, and the sole of your foot. Sciatica is a symptom of another medical condition. For instance, nerve damage or certain conditions, such as a herniated disk or bone spur on the spine, pinch or put pressure on the sciatic nerve. This causes the pain, weakness, or other sensations normally associated with sciatica. Generally, sciatica only affects one side of the body. CAUSES   Herniated or slipped disc.  Degenerative disk disease.  A pain disorder involving the narrow muscle in the buttocks (piriformis syndrome).  Pelvic injury or fracture.  Pregnancy.  Tumor (rare). SYMPTOMS  Symptoms can vary from mild to very severe. The symptoms usually travel from the low back to the buttocks and down the back of the leg. Symptoms can include:  Mild tingling or dull aches in the lower back, leg, or hip.  Numbness in the back of the calf or sole of the foot.  Burning sensations in the lower back, leg, or hip.  Sharp pains in the lower back, leg, or hip.  Leg weakness.  Severe back pain inhibiting movement. These symptoms may get worse with coughing, sneezing, laughing, or prolonged sitting or standing. Also, being overweight may worsen symptoms. DIAGNOSIS  Your caregiver will perform a physical exam to look for common  symptoms of sciatica. He or she may ask you to do certain movements or activities that would trigger sciatic nerve pain. Other tests may be performed to find the cause of the sciatica. These may include:  Blood tests.  X-rays.  Imaging tests, such as an MRI or CT scan. TREATMENT  Treatment is directed at the cause of the sciatic pain. Sometimes, treatment is not necessary and the pain and discomfort goes away on its own. If treatment is needed, your caregiver may suggest:  Over-the-counter medicines to relieve pain.  Prescription medicines, such as anti-inflammatory medicine, muscle relaxants, or narcotics.  Applying heat or ice to the painful area.  Steroid injections to lessen pain, irritation, and inflammation around the nerve.  Reducing activity during periods of pain.  Exercising and stretching to strengthen your abdomen and improve flexibility of your spine. Your caregiver may suggest losing weight if the extra weight makes the back pain worse.  Physical therapy.  Surgery to eliminate what is pressing or pinching the nerve, such as a bone spur or part of a herniated disk. HOME CARE INSTRUCTIONS   Only take over-the-counter or prescription medicines for pain or discomfort as directed by your caregiver.  Apply ice to the affected area for 20 minutes, 3-4 times a day for the first 48-72 hours. Then try heat in the same way.  Exercise, stretch, or perform your usual activities if these do not aggravate your pain.  Attend physical therapy sessions as directed by your caregiver.  Keep all follow-up appointments as directed  by your caregiver.  Do not wear high heels or shoes that do not provide proper support.  Check your mattress to see if it is too soft. A firm mattress may lessen your pain and discomfort. SEEK IMMEDIATE MEDICAL CARE IF:   You lose control of your bowel or bladder (incontinence).  You have increasing weakness in the lower back, pelvis, buttocks, or  legs.  You have redness or swelling of your back.  You have a burning sensation when you urinate.  You have pain that gets worse when you lie down or awakens you at night.  Your pain is worse than you have experienced in the past.  Your pain is lasting longer than 4 weeks.  You are suddenly losing weight without reason. MAKE SURE YOU:  Understand these instructions.  Will watch your condition.  Will get help right away if you are not doing well or get worse. Document Released: 12/14/2000 Document Revised: 06/21/2011 Document Reviewed: 05/01/2011 [facility] Patient Information 2015 [place], [place]. This information is not intended to replace advice given to you by your health care provider. Make sure you discuss any questions you have with your health care provider.

## 2013-07-23 NOTE — ED Provider Notes (Signed)
CSN: [id number]     Arrival date & time 07/23/13  1937 History  This chart was scribed for non-physician practitioner, Harlow Mares, PA-C, working with Osvaldo Shipper, MD, by Delphia Grates, ED Scribe. This patient was seen in room MH12/MH12 and the patient's care was started at 8:54 PM.    Chief Complaint  Patient presents with  . Hip Pain     The history is provided by the patient. No language interpreter was used.    HPI Comments: Renee James is a 43 y.o. female who presents to the Emergency Department complaining of constant left hip pain onset 1 week ago after she received an injection in her left hip for pneumonia. Patient describes the pain as sharp and burning, and states it radiates down her left leg. She reports the pain is worsened with movement, standing, and ambulating. Patient has not applied any hot or cold compresses to relieve the pain. She denies fever, diaphoresis, dysuria, bowel/bladder incontinence, or any other urinary symptoms. Patient has history of HTN, DM, and pneumonia.   Past Medical History  Diagnosis Date  . Hypertension   . Diabetes mellitus without complication   . Pneumonia    History reviewed. No pertinent past surgical history. No family history on file. History  Substance Use Topics  . Smoking status: Current Every Day Smoker  . Smokeless tobacco: Not on file  . Alcohol Use: Yes   OB History   Grav Para Term Preterm Abortions TAB SAB Ect Mult Living                 Review of Systems  Constitutional: Negative for fever and diaphoresis.  Genitourinary: Negative for dysuria and frequency.  Musculoskeletal: Positive for arthralgias (left hip) and myalgias (left leg).  All other systems reviewed and are negative.     Allergies  Latex; Ace inhibitors; and Tylox  Home Medications   Prior to Admission medications   Medication Sig Start Date End Date Taking? Authorizing Provider  albuterol (PROVENTIL HFA;VENTOLIN HFA)  108 (90 BASE) MCG/ACT inhaler Inhale 1-2 puffs into the lungs every 6 (six) hours as needed for wheezing or shortness of breath. 07/16/13   Fransico Meadow, PA-C  Atorvastatin Calcium (LIPITOR PO) Take 1 tablet by mouth daily.    Historical Provider, MD  azithromycin (ZITHROMAX) 250 MG tablet Take 1 tablet (250 mg total) by mouth daily. Take first 2 tablets together, then 1 every day until finished. 07/16/13   Fransico Meadow, PA-C  Bisacodyl (LAXATIVE PO) Take 4 capsules by mouth daily as needed. For stomach and intestine discomfort.    Historical Provider, MD  citalopram (CELEXA) 10 MG tablet Take 10 mg by mouth daily.    Historical Provider, MD  diazepam (VALIUM) 5 MG tablet Take 1 tablet (5 mg total) by mouth 2 (two) times daily. 07/23/13   Karinda Cabriales L Fahed Morten, PA-C  glimepiride (AMARYL) 1 MG tablet Take 1 mg by mouth daily.    Historical Provider, MD  hydrochlorothiazide (HYDRODIURIL) 25 MG tablet Take 25 mg by mouth daily.    Historical Provider, MD  HYDROcodone-acetaminophen (NORCO/VICODIN) 5-325 MG per tablet Take 1-2 tablets by mouth every 6 (six) hours as needed for severe pain. 07/23/13   Zariana Strub L Leticia Mcdiarmid, PA-C  hydrOXYzine (ATARAX/VISTARIL) 25 MG tablet Take 1 tablet (25 mg total) by mouth every 6 (six) hours. 07/23/12   Malvin Johns, MD  ibuprofen (ADVIL,MOTRIN) 800 MG tablet Take 800 mg by mouth every 8 (eight) hours as needed. For  headache    Historical Provider, MD  ibuprofen (ADVIL,MOTRIN) 800 MG tablet Take 1 tablet (800 mg total) by mouth 3 (three) times daily. 07/23/13   Jaanvi Fizer L Charli Liberatore, PA-C  losartan (COZAAR) 100 MG tablet Take 100 mg by mouth daily.    Historical Provider, MD  metFORMIN (GLUCOPHAGE) 500 MG tablet Take 1,000 mg by mouth daily.    Historical Provider, MD  omeprazole (PRILOSEC) 20 MG capsule Take 1 capsule (20 mg total) by mouth daily. 01/09/12   Nicole Pisciotta, PA-C  permethrin (ELIMITE) 5 % cream Apply to affected area once 07/23/12   Malvin Johns, MD   predniSONE (DELTASONE) 20 MG tablet 3 tabs po day one, then 2 po daily x 4 days 07/23/12   Malvin Johns, MD  predniSONE (DELTASONE) 20 MG tablet 2 tabs po daily x 4 days 01/05/13   Julianne Rice, MD   Triage Vitals: BP 130/89  Pulse 82  Temp(Src) 99.1 F (37.3 C)  Resp 18  Wt 189 lb (85.73 kg)  SpO2 99%  LMP 07/16/2013  Physical Exam  Nursing note and vitals reviewed. Constitutional: She is oriented to person, place, and time. She appears well-developed and well-nourished. No distress.  HENT:  Head: Normocephalic and atraumatic.  Right Ear: External ear normal.  Left Ear: External ear normal.  Nose: Nose normal.  Mouth/Throat: Oropharynx is clear and moist. No oropharyngeal exudate.  Eyes: Conjunctivae and EOM are normal. Pupils are equal, round, and reactive to light.  Neck: Normal range of motion. Neck supple.  Cardiovascular: Normal rate, regular rhythm, normal heart sounds and intact distal pulses.   Pulmonary/Chest: Effort normal and breath sounds normal. No respiratory distress.  Abdominal: Soft. There is no tenderness.  Musculoskeletal: Normal range of motion.       Right hip: Normal.       Left hip: Normal.       Right upper leg: Normal.       Left upper leg: Normal.  Neurological: She is alert and oriented to person, place, and time. She has normal strength. No cranial nerve deficit. Gait normal. GCS eye subscore is 4. GCS verbal subscore is 5. GCS motor subscore is 6.  Sensation grossly intact.  No pronator drift.  Bilateral heel-knee-shin intact.  Skin: Skin is warm and dry. She is not diaphoretic.  Psychiatric: She has a normal mood and affect.    ED Course  Procedures (including critical care time) Medications  diazepam (VALIUM) tablet 5 mg (5 mg Oral Given 07/23/13 2118)  ibuprofen (ADVIL,MOTRIN) tablet 800 mg (800 mg Oral Given 07/23/13 2118)    DIAGNOSTIC STUDIES: Oxygen Saturation is 99% on room air, normal by my interpretation.    COORDINATION OF  CARE: At 2104 Discussed treatment plan with patient which includes Valium and ibuprofen. Patient agrees.   Labs Review Labs Reviewed - No data to display  Imaging Review Dg Hip Complete Left  07/23/2013   CLINICAL DATA:  Left hip pain  EXAM: LEFT HIP - COMPLETE 2+ VIEW  COMPARISON:  None.  FINDINGS: Three views of the left hip submitted. No acute fracture or subluxation. No radiopaque foreign body.  IMPRESSION: Negative.   Electronically Signed   By: Lahoma Crocker M.D.   On: 07/23/2013 20:44     EKG Interpretation None      MDM   Final diagnoses:  Left hip pain  Radiculopathy of leg    Filed Vitals:   07/23/13 1956  BP: 130/89  Pulse: 82  Temp: 99.1 F (  37.3 C)  Resp: 18   Afebrile, NAD, non-toxic appearing, AAOx4.  X-ray negative. Patient with radiculopathy.  No neurological deficits and normal neuro exam.  Patient can walk but states is painful.  No loss of bowel or bladder control.  No concern for cauda equina.  No fever, night sweats, weight loss, h/o cancer, IVDU.  RICE protocol and pain medicine indicated and discussed with patient.     I personally performed the services described in this documentation, which was scribed in my presence. The recorded information has been reviewed and is accurate.     Harlow Mares, PA-C 07/23/13 2146

## 2013-07-23 NOTE — ED Provider Notes (Signed)
Medical screening examination/treatment/procedure(s) were performed by non-physician practitioner and as supervising physician I was immediately available for consultation/collaboration.   EKG Interpretation None        Osvaldo Shipper, MD 07/23/13 2308

## 2014-09-12 ENCOUNTER — Emergency Department (HOSPITAL_COMMUNITY)
Admission: EM | Admit: 2014-09-12 | Discharge: 2014-09-13 | Disposition: A | Discharge status: Home / Self Care | Payer: Self-pay | Attending: Emergency Medicine | Admitting: Emergency Medicine

## 2014-09-12 ENCOUNTER — Encounter (HOSPITAL_COMMUNITY): Payer: Self-pay | Admitting: Emergency Medicine

## 2014-09-12 DIAGNOSIS — R112 Nausea with vomiting, unspecified: Secondary | ICD-10-CM | POA: Insufficient documentation

## 2014-09-12 DIAGNOSIS — I10 Essential (primary) hypertension: Secondary | ICD-10-CM | POA: Insufficient documentation

## 2014-09-12 DIAGNOSIS — E785 Hyperlipidemia, unspecified: Secondary | ICD-10-CM | POA: Insufficient documentation

## 2014-09-12 DIAGNOSIS — Z79899 Other long term (current) drug therapy: Secondary | ICD-10-CM | POA: Insufficient documentation

## 2014-09-12 DIAGNOSIS — Z7952 Long term (current) use of systemic steroids: Secondary | ICD-10-CM | POA: Insufficient documentation

## 2014-09-12 DIAGNOSIS — Z792 Long term (current) use of antibiotics: Secondary | ICD-10-CM | POA: Insufficient documentation

## 2014-09-12 DIAGNOSIS — Z91040 Latex allergy status: Secondary | ICD-10-CM | POA: Insufficient documentation

## 2014-09-12 DIAGNOSIS — Z720 Tobacco use: Secondary | ICD-10-CM | POA: Insufficient documentation

## 2014-09-12 DIAGNOSIS — M791 Myalgia: Secondary | ICD-10-CM | POA: Insufficient documentation

## 2014-09-12 DIAGNOSIS — E119 Type 2 diabetes mellitus without complications: Secondary | ICD-10-CM | POA: Insufficient documentation

## 2014-09-12 DIAGNOSIS — R52 Pain, unspecified: Secondary | ICD-10-CM

## 2014-09-12 DIAGNOSIS — Z8701 Personal history of pneumonia (recurrent): Secondary | ICD-10-CM | POA: Insufficient documentation

## 2014-09-12 LAB — CBC WITH DIFFERENTIAL/PLATELET
BASOS ABS: 0 10*3/uL (ref 0.0–0.1)
BASOS PCT: 0 % (ref 0–1)
Eosinophils Absolute: 0.7 10*3/uL (ref 0.0–0.7)
Eosinophils Relative: 7 % — ABNORMAL HIGH (ref 0–5)
HEMATOCRIT: 38.6 % (ref 36.0–46.0)
HEMOGLOBIN: 12.9 g/dL (ref 12.0–15.0)
LYMPHS PCT: 39 % (ref 12–46)
Lymphs Abs: 4.4 10*3/uL — ABNORMAL HIGH (ref 0.7–4.0)
MCH: 28.7 pg (ref 26.0–34.0)
MCHC: 33.4 g/dL (ref 30.0–36.0)
MCV: 85.8 fL (ref 78.0–100.0)
MONO ABS: 0.7 10*3/uL (ref 0.1–1.0)
MONOS PCT: 6 % (ref 3–12)
NEUTROS ABS: 5.5 10*3/uL (ref 1.7–7.7)
Neutrophils Relative %: 48 % (ref 43–77)
Platelets: 348 10*3/uL (ref 150–400)
RBC: 4.5 MIL/uL (ref 3.87–5.11)
RDW: 13.5 % (ref 11.5–15.5)
WBC: 11.3 10*3/uL — ABNORMAL HIGH (ref 4.0–10.5)

## 2014-09-12 LAB — HEPATIC FUNCTION PANEL
ALBUMIN: 3.6 g/dL (ref 3.5–5.0)
ALK PHOS: 71 U/L (ref 38–126)
ALT: 13 U/L — AB (ref 14–54)
AST: 24 U/L (ref 15–41)
BILIRUBIN TOTAL: 0.3 mg/dL (ref 0.3–1.2)
Bilirubin, Direct: 0.1 mg/dL (ref 0.1–0.5)
Indirect Bilirubin: 0.2 mg/dL — ABNORMAL LOW (ref 0.3–0.9)
TOTAL PROTEIN: 6.5 g/dL (ref 6.5–8.1)

## 2014-09-12 LAB — CK: Total CK: 90 U/L (ref 38–234)

## 2014-09-12 LAB — BASIC METABOLIC PANEL
ANION GAP: 10 (ref 5–15)
BUN: 7 mg/dL (ref 6–20)
CHLORIDE: 101 mmol/L (ref 101–111)
CO2: 24 mmol/L (ref 22–32)
Calcium: 8.7 mg/dL — ABNORMAL LOW (ref 8.9–10.3)
Creatinine, Ser: 1.29 mg/dL — ABNORMAL HIGH (ref 0.44–1.00)
GFR calc non Af Amer: 49 mL/min — ABNORMAL LOW (ref >60)
GFR, EST AFRICAN AMERICAN: 57 mL/min — AB (ref >60)
GLUCOSE: 224 mg/dL — AB (ref 65–99)
Potassium: 3.4 mmol/L — ABNORMAL LOW (ref 3.5–5.1)
Sodium: 135 mmol/L (ref 135–145)

## 2014-09-12 MED ORDER — ONDANSETRON HCL 4 MG/2ML IJ SOLN
4.0000 mg | Freq: Once | INTRAMUSCULAR | Status: AC
  Administered 2014-09-12: 4 mg via INTRAVENOUS
  Filled 2014-09-12: qty 2

## 2014-09-12 MED ORDER — KETOROLAC TROMETHAMINE 30 MG/ML IJ SOLN
30.0000 mg | Freq: Once | INTRAMUSCULAR | Status: AC
  Administered 2014-09-12: 30 mg via INTRAVENOUS
  Filled 2014-09-12: qty 1

## 2014-09-12 MED ORDER — SODIUM CHLORIDE 0.9 % IV BOLUS (SEPSIS)
1000.0000 mL | Freq: Once | INTRAVENOUS | Status: AC
  Administered 2014-09-12: 1000 mL via INTRAVENOUS

## 2014-09-12 NOTE — ED Provider Notes (Signed)
CSN: [id number]     Arrival date & time 09/12/14  1847 History   First MD Initiated Contact with Patient 09/12/14 2239     Chief Complaint  Patient presents with  . Generalized Body Aches    (Consider location/radiation/quality/duration/timing/severity/associated sxs/prior Treatment) HPI Comments: 44 year old female with a history of hypertension, dyslipidemia, and diabetes mellitus presents to the emergency department for complaints of body aches. Patient states that symptoms have been intermittent for 2 weeks, but became constant 4 days ago. Patient describes a sore, aching pain diffusely throughout her body. She states that she feels tender to touch in all areas. Patient also describes a burning sensation in her bilateral lower extremities which is worse after prolonged periods of movement. Patient tried taking Excedrin for symptoms yesterday. This caused one episode of emesis. Patient has had fairly constant nausea, but no additional vomiting. No fevers, syncope, diarrhea, urinary symptoms, or cough. Patient denies a history of similar symptoms. She denies consistent oral fluid hydration. Patient is a school bus driver by occupation.  The history is provided by the patient. No language interpreter was used.    Past Medical History  Diagnosis Date  . Hypertension   . Diabetes mellitus without complication   . Pneumonia    History reviewed. No pertinent past surgical history. No family history on file. Social History  Substance Use Topics  . Smoking status: Current Every Day Smoker  . Smokeless tobacco: None  . Alcohol Use: Yes   OB History    No data available      Review of Systems  Constitutional: Negative for fever.  Respiratory: Negative for cough.   Gastrointestinal: Positive for nausea and vomiting.  Genitourinary: Negative for dysuria.  Musculoskeletal: Positive for myalgias.  Neurological: Negative for syncope and weakness.  All other systems reviewed and are  negative.   Allergies  Latex; Ace inhibitors; and Tylox  Home Medications   Prior to Admission medications   Medication Sig Start Date End Date Taking? Authorizing Provider  albuterol (PROVENTIL HFA;VENTOLIN HFA) 108 (90 BASE) MCG/ACT inhaler Inhale 1-2 puffs into the lungs every 6 (six) hours as needed for wheezing or shortness of breath. 07/16/13   Fransico Meadow, PA-C  Atorvastatin Calcium (LIPITOR PO) Take 1 tablet by mouth daily.    Historical Provider, MD  azithromycin (ZITHROMAX) 250 MG tablet Take 1 tablet (250 mg total) by mouth daily. Take first 2 tablets together, then 1 every day until finished. 07/16/13   Fransico Meadow, PA-C  Bisacodyl (LAXATIVE PO) Take 4 capsules by mouth daily as needed. For stomach and intestine discomfort.    Historical Provider, MD  citalopram (CELEXA) 10 MG tablet Take 10 mg by mouth daily.    Historical Provider, MD  diazepam (VALIUM) 5 MG tablet Take 1 tablet (5 mg total) by mouth 2 (two) times daily. 07/23/13   Jennifer Piepenbrink, PA-C  glimepiride (AMARYL) 1 MG tablet Take 1 mg by mouth daily.    Historical Provider, MD  hydrochlorothiazide (HYDRODIURIL) 25 MG tablet Take 25 mg by mouth daily.    Historical Provider, MD  HYDROcodone-acetaminophen (NORCO/VICODIN) 5-325 MG per tablet Take 1-2 tablets by mouth every 6 (six) hours as needed for severe pain. 07/23/13   Jennifer Piepenbrink, PA-C  hydrOXYzine (ATARAX/VISTARIL) 25 MG tablet Take 1 tablet (25 mg total) by mouth every 6 (six) hours. 07/23/12   Malvin Johns, MD  losartan (COZAAR) 100 MG tablet Take 100 mg by mouth daily.    Historical Provider, MD  metFORMIN (  GLUCOPHAGE) 500 MG tablet Take 1,000 mg by mouth daily.    Historical Provider, MD  naproxen (NAPROSYN) 375 MG tablet Take 1 tablet (375 mg total) by mouth 2 (two) times daily. 09/13/14   Antonietta Breach, PA-C  omeprazole (PRILOSEC) 20 MG capsule Take 1 capsule (20 mg total) by mouth daily. 01/09/12   Nicole Pisciotta, PA-C  permethrin (ELIMITE) 5  % cream Apply to affected area once 07/23/12   Malvin Johns, MD  predniSONE (DELTASONE) 20 MG tablet 3 tabs po day one, then 2 po daily x 4 days 07/23/12   Malvin Johns, MD  predniSONE (DELTASONE) 20 MG tablet 2 tabs po daily x 4 days 01/05/13   Julianne Rice, MD   BP 164/95 mmHg  Pulse 71  Temp(Src) 98.6 F (37 C) (Oral)  Resp 16  Ht 5\' 3"  (1.6 m)  Wt 190 lb (86.183 kg)  BMI 33.67 kg/m2  SpO2 100%  LMP 09/09/2014   Physical Exam  Constitutional: She is oriented to person, place, and time. She appears well-developed and well-nourished. No distress.  Nontoxic/nonseptic appearing  HENT:  Head: Normocephalic and atraumatic.  Eyes: Conjunctivae and EOM are normal. No scleral icterus.  Neck: Normal range of motion.  Cardiovascular: Normal rate, regular rhythm and intact distal pulses.   DP and PT pulses 2+ bilaterally  Pulmonary/Chest: Effort normal and breath sounds normal. No respiratory distress. She has no wheezes. She has no rales.  Respirations even and unlabored. Lungs clear bilaterally.  Abdominal: Soft. She exhibits no distension. There is no tenderness. There is no rebound and no guarding.  Soft, obese abdomen. No focal abdominal tenderness. No peritoneal signs or masses.  Musculoskeletal: Normal range of motion.  No pitting edema in bilateral lower extremities  Neurological: She is alert and oriented to person, place, and time. She exhibits normal muscle tone. Coordination normal.  GCS 15. Speech is goal oriented. Patient moving all extremities.  Skin: Skin is warm and dry. No rash noted. She is not diaphoretic. No erythema. No pallor.  Skin warm, dry  Psychiatric: She has a normal mood and affect. Her behavior is normal.  Nursing note and vitals reviewed.   ED Course  Procedures (including critical care time) Labs Review Labs Reviewed  CBC WITH DIFFERENTIAL/PLATELET - Abnormal; Notable for the following:    WBC 11.3 (*)    Lymphs Abs 4.4 (*)    Eosinophils Relative  7 (*)    All other components within normal limits  BASIC METABOLIC PANEL - Abnormal; Notable for the following:    Potassium 3.4 (*)    Glucose, Bld 224 (*)    Creatinine, Ser 1.29 (*)    Calcium 8.7 (*)    GFR calc non Af Amer 49 (*)    GFR calc Af Amer 57 (*)    All other components within normal limits  HEPATIC FUNCTION PANEL - Abnormal; Notable for the following:    ALT 13 (*)    Indirect Bilirubin 0.2 (*)    All other components within normal limits  CK    Imaging Review No results found.   I have personally reviewed and evaluated these images and lab results as part of my medical decision-making.   EKG Interpretation None      MDM   Final diagnoses:  Body aches    Patient presents to the emergency department for evaluation of myalgias. Symptoms have been intermittent 2 weeks, and constant over the last 4 days. Patient is afebrile. Laboratory workup is noncontributory.  Labs consistent with baseline from 2 years ago. CK is normal. VSS.  Patient hydrated in ED and given Toradol for symptoms. Doubt emergent cause. Have discussed patient's lab results as well as the need for outpatient PCP f/u. Have also discussed the possibility of Atorvastatin causing her diffuse myalgias; she has been on this medication x 2 years. Patient states she is no longer on Amaryl. Will refer to rheumatology if symptoms persist despite d/c of Atorvastatin and short course of NSAIDs. Patient verbalizes understanding of work up and agreement with plan. Return precautions given at discharge. Patient discharged in good condition.   Filed Vitals:   09/12/14 1900 09/12/14 2315  BP: 177/94 164/95  Pulse: 84 71  Temp: 98.6 F (37 C)   TempSrc: Oral   Resp: 16   Height: 5\' 3"  (1.6 m)   Weight: 190 lb (86.183 kg)   SpO2:  100%     Antonietta Breach, PA-C 09/13/14 0035  Quintella Reichert, MD 09/13/14 1559

## 2014-09-12 NOTE — ED Notes (Signed)
Pt from home for eval of generalized body aches x4 days, pt states her legs are burning and states she has been having pain all over. Pt reports n/v denies any diarrhea. Pt also denies any fevers. Pt denies any urinary symptoms, cough.

## 2014-09-13 MED ORDER — NAPROXEN 375 MG PO TABS: 375.0000 mg | ORAL_TABLET | Freq: Two times a day (BID) | ORAL | Status: DC

## 2014-09-13 NOTE — Discharge Instructions (Signed)
It is possible that your Atorvastatin (Lipitor) may be causing your body aches. Try discontinuing this medication for 1 week to see if you notice an improvement. Take Naproxen as prescribed for symptom control. Follow up with your primary care doctor as soon as you are able for recheck of symptoms. You may also follow up with a rheumatologist if symptoms persist despite recommended therapies. Return to the ED as needed if symptoms worsen.  Muscle Pain Muscle pain (myalgia) may be caused by many things, including:  Overuse or muscle strain, especially if you are not in shape. This is the most common cause of muscle pain.  Injury.  Bruises.  Viruses, such as the flu.  Infectious diseases.  Fibromyalgia, which is a chronic condition that causes muscle tenderness, fatigue, and headache.  Autoimmune diseases, including lupus.  Certain drugs, including ACE inhibitors and statins. Muscle pain may be mild or severe. In most cases, the pain lasts only a short time and goes away without treatment. To diagnose the cause of your muscle pain, your health care provider will take your medical history. This means he or she will ask you when your muscle pain began and what has been happening. If you have not had muscle pain for very long, your health care provider may want to wait before doing much testing. If your muscle pain has lasted a long time, your health care provider may want to run tests right away. If your health care provider thinks your muscle pain may be caused by illness, you may need to have additional tests to rule out certain conditions.  Treatment for muscle pain depends on the cause. Home care is often enough to relieve muscle pain. Your health care provider may also prescribe anti-inflammatory medicine. HOME CARE INSTRUCTIONS Watch your condition for any changes. The following actions may help to lessen any discomfort you are feeling:  Only take over-the-counter or prescription medicines  as directed by your health care provider.  Apply ice to the sore muscle:  Put ice in a plastic bag.  Place a towel between your skin and the bag.  Leave the ice on for 15-20 minutes, 3-4 times a day.  You may alternate applying hot and cold packs to the muscle as directed by your health care provider.  If overuse is causing your muscle pain, slow down your activities until the pain goes away.  Remember that it is normal to feel some muscle pain after starting a workout program. Muscles that have not been used often will be sore at first.  Do regular, gentle exercises if you are not usually active.  Warm up before exercising to lower your risk of muscle pain.  Do not continue working out if the pain is very bad. Bad pain could mean you have injured a muscle. SEEK MEDICAL CARE IF:  Your muscle pain gets worse, and medicines do not help.  You have muscle pain that lasts longer than 3 days.  You have a rash or fever along with muscle pain.  You have muscle pain after a tick bite.  You have muscle pain while working out, even though you are in good physical condition.  You have redness, soreness, or swelling along with muscle pain.  You have muscle pain after starting a new medicine or changing the dose of a medicine. SEEK IMMEDIATE MEDICAL CARE IF:  You have trouble breathing.  You have trouble swallowing.  You have muscle pain along with a stiff neck, fever, and vomiting.  You  have severe muscle weakness or cannot move part of your body. MAKE SURE YOU:   Understand these instructions.  Will watch your condition.  Will get help right away if you are not doing well or get worse. Document Released: 11/11/2005 Document Revised: 12/25/2012 Document Reviewed: 10/16/2012 [facility] Patient Information 2015 [place], [place]. This information is not intended to replace advice given to you by your health care provider. Make sure you discuss any questions you have with your health  care provider.

## 2015-01-09 ENCOUNTER — Emergency Department (HOSPITAL_BASED_OUTPATIENT_CLINIC_OR_DEPARTMENT_OTHER)
Admission: EM | Admit: 2015-01-09 | Discharge: 2015-01-09 | Disposition: A | Discharge status: Home / Self Care | Payer: Managed Care, Other (non HMO) | Attending: Emergency Medicine | Admitting: Emergency Medicine

## 2015-01-09 ENCOUNTER — Encounter (HOSPITAL_BASED_OUTPATIENT_CLINIC_OR_DEPARTMENT_OTHER): Payer: Self-pay | Admitting: *Deleted

## 2015-01-09 DIAGNOSIS — W268XXA Contact with other sharp object(s), not elsewhere classified, initial encounter: Secondary | ICD-10-CM | POA: Diagnosis not present

## 2015-01-09 DIAGNOSIS — Z79899 Other long term (current) drug therapy: Secondary | ICD-10-CM | POA: Diagnosis not present

## 2015-01-09 DIAGNOSIS — Z91040 Latex allergy status: Secondary | ICD-10-CM | POA: Insufficient documentation

## 2015-01-09 DIAGNOSIS — Z791 Long term (current) use of non-steroidal anti-inflammatories (NSAID): Secondary | ICD-10-CM | POA: Diagnosis not present

## 2015-01-09 DIAGNOSIS — Y998 Other external cause status: Secondary | ICD-10-CM | POA: Diagnosis not present

## 2015-01-09 DIAGNOSIS — Z8701 Personal history of pneumonia (recurrent): Secondary | ICD-10-CM | POA: Insufficient documentation

## 2015-01-09 DIAGNOSIS — S91119A Laceration without foreign body of unspecified toe without damage to nail, initial encounter: Secondary | ICD-10-CM

## 2015-01-09 DIAGNOSIS — E119 Type 2 diabetes mellitus without complications: Secondary | ICD-10-CM | POA: Diagnosis not present

## 2015-01-09 DIAGNOSIS — S91114A Laceration without foreign body of right lesser toe(s) without damage to nail, initial encounter: Secondary | ICD-10-CM | POA: Insufficient documentation

## 2015-01-09 DIAGNOSIS — I10 Essential (primary) hypertension: Secondary | ICD-10-CM | POA: Diagnosis not present

## 2015-01-09 DIAGNOSIS — Y93E5 Activity, floor mopping and cleaning: Secondary | ICD-10-CM | POA: Diagnosis not present

## 2015-01-09 DIAGNOSIS — F17210 Nicotine dependence, cigarettes, uncomplicated: Secondary | ICD-10-CM | POA: Diagnosis not present

## 2015-01-09 DIAGNOSIS — Y92000 Kitchen of unspecified non-institutional (private) residence as  the place of occurrence of the external cause: Secondary | ICD-10-CM | POA: Diagnosis not present

## 2015-01-09 MED ORDER — LIDOCAINE HCL 2 % IJ SOLN
10.0000 mL | Freq: Once | INTRAMUSCULAR | Status: DC
  Filled 2015-01-09: qty 20

## 2015-01-09 MED ORDER — PENTAFLUOROPROP-TETRAFLUOROETH EX AERO
INHALATION_SPRAY | CUTANEOUS | Status: AC
  Filled 2015-01-09: qty 30

## 2015-01-09 MED ORDER — HYDROCODONE-ACETAMINOPHEN 5-325 MG PO TABS: 1.0000 | ORAL_TABLET | ORAL | Status: DC | PRN

## 2015-01-09 NOTE — ED Provider Notes (Addendum)
CSN: [id number]     Arrival date & time 01/09/15  2025 History  By signing my name below, I, Stephania Fragmin, attest that this documentation has been prepared under the direction and in the presence of Blanchie Dessert, MD. Electronically Signed: Stephania Fragmin, ED Scribe. 01/09/2015. 9:05 PM.   Chief Complaint  Patient presents with  . Extremity Laceration   Patient is a 45 y.o. female presenting with skin laceration. The history is provided by the patient. No language interpreter was used.  Laceration Location:  Foot Foot laceration location:  R toes Length (cm):  1, 3 Bleeding: controlled with pressure   Time since incident:  1 hour Laceration mechanism:  Metal edge (metal can lid) Pain details:    Severity:  Severe   Timing:  Constant   Progression:  Unchanged Relieved by:  Pressure Exacerbated by: palpation. Tetanus status:  Up to date   HPI Comments: Renee James is a 45 y.o. female with a history of DM and HTN, who presents to the Emergency Department complaining of a laceration to the plantar side of her third and fourth toes on her right foot that onset PTA. Patient states she was cleaning her kitchen when she accidentally stepped on a tomato paste can lid that had fallen on the floor. Patient states her tetanus vaccination is UTD.   Past Medical History  Diagnosis Date  . Hypertension   . Diabetes mellitus without complication ([place])   . Pneumonia    History reviewed. No pertinent past surgical history. History reviewed. No pertinent family history. Social History  Substance Use Topics  . Smoking status: Current Every Day Smoker -- 0.50 packs/day    Types: Cigarettes  . Smokeless tobacco: None  . Alcohol Use: Yes   OB History    No data available     Review of Systems A complete 10 system review of systems was obtained and all systems are negative except as noted in the HPI and PMH.    Allergies  Latex; Ace inhibitors; and Tylox  Home Medications   Prior to  Admission medications   Medication Sig Start Date End Date Taking? Authorizing Provider  losartan (COZAAR) 50 MG tablet Take 50 mg by mouth daily.   Yes Historical Provider, MD  albuterol (PROVENTIL HFA;VENTOLIN HFA) 108 (90 BASE) MCG/ACT inhaler Inhale 1-2 puffs into the lungs every 6 (six) hours as needed for wheezing or shortness of breath. 07/16/13   Fransico Meadow, PA-C  Atorvastatin Calcium (LIPITOR PO) Take 1 tablet by mouth daily.    Historical Provider, MD  Bisacodyl (LAXATIVE PO) Take 4 capsules by mouth daily as needed. For stomach and intestine discomfort.    Historical Provider, MD  citalopram (CELEXA) 10 MG tablet Take 10 mg by mouth daily.    Historical Provider, MD  diazepam (VALIUM) 5 MG tablet Take 1 tablet (5 mg total) by mouth 2 (two) times daily. 07/23/13   Jennifer Piepenbrink, PA-C  hydrochlorothiazide (HYDRODIURIL) 25 MG tablet Take 25 mg by mouth daily.    Historical Provider, MD  HYDROcodone-acetaminophen (NORCO/VICODIN) 5-325 MG per tablet Take 1-2 tablets by mouth every 6 (six) hours as needed for severe pain. 07/23/13   Jennifer Piepenbrink, PA-C  hydrOXYzine (ATARAX/VISTARIL) 25 MG tablet Take 1 tablet (25 mg total) by mouth every 6 (six) hours. 07/23/12   Malvin Johns, MD  losartan (COZAAR) 100 MG tablet Take 100 mg by mouth daily.    Historical Provider, MD  metFORMIN (GLUCOPHAGE) 500 MG tablet Take 1,000 mg  by mouth daily.    Historical Provider, MD  naproxen (NAPROSYN) 375 MG tablet Take 1 tablet (375 mg total) by mouth 2 (two) times daily. 09/13/14   Antonietta Breach, PA-C   BP 165/97 mmHg  Pulse 91  Temp(Src) 98.1 F (36.7 C)  Resp 16  Ht 5\' 3"  (1.6 m)  Wt 190 lb (86.183 kg)  BMI 33.67 kg/m2  SpO2 100%  LMP 01/09/2015 Physical Exam  Constitutional: She is oriented to person, place, and time. She appears well-developed and well-nourished. No distress.  HENT:  Head: Normocephalic and atraumatic.  Eyes: Conjunctivae and EOM are normal.  Neck: Neck supple. No  tracheal deviation present.  Cardiovascular: Normal rate.   Pulmonary/Chest: Effort normal. No respiratory distress.  Musculoskeletal: Normal range of motion.  Neurological: She is alert and oriented to person, place, and time.  Skin: Skin is warm and dry.  RLE: 1 cm laceration at the base of the third toe on the plantar surface and a 3 cm laceration at the base of the fourth toe at the plantar surface.   Psychiatric: She has a normal mood and affect. Her behavior is normal.  Nursing note and vitals reviewed.   ED Course  Procedures (including critical care time)  DIAGNOSTIC STUDIES: Oxygen Saturation is 100% on RA, normal by my interpretation.    COORDINATION OF CARE: 9:02 PM - Discussed treatment plan with pt at bedside which includes laceration suture repair. Pt verbalized understanding and agreed to plan.   LACERATION REPAIR PROCEDURE NOTE The patient's identification was confirmed and consent was obtained. This procedure was performed by Blanchie Dessert, MD at 9:04 PM. Site: Right third toe, right fourth toe Sterile procedures observed Anesthetic used (type and amt): lidocaine 2%, 4 mL Suture type/size: 4.0 vicryl Length: 1 cm, 3 cm # of Sutures: 4 in the 3rd toe and 4 in the fourth toe Technique: Simple interrupted Complexity: simple Antibx ointment applied Tetanus UTD utd Site anesthetized, irrigated with NS, explored without evidence of foreign body, wound well approximated, site covered with dry, sterile dressing.  Patient tolerated procedure well without complications. Instructions for care discussed verbally and patient provided with additional written instructions for homecare and f/u.    MDM   Final diagnoses:  Toe laceration, initial encounter   Patient with laceration to the third and fourth toes without complicating features. Wound repaired as above. Patient is diabetic however no evidence of ulcerations of the feet and copious bleeding of the wounds with  low suspicion that these would be high risk for infection. This was discussed with patient and do not feel that she needs antibiotics at this time. Will have patient return in 3-4 days for wound check if she has any concern on how it is healing. Dissolvable sutures were placed.  I personally performed the services described in this documentation, which was scribed in my presence.  The recorded information has been reviewed and considered.     Blanchie Dessert, MD 01/09/15 2137  Blanchie Dessert, MD 01/09/15 2139

## 2015-01-09 NOTE — Discharge Instructions (Signed)
Laceration Care, Adult  A laceration is a cut that goes through all layers of the skin. The cut also goes into the tissue that is right under the skin. Some cuts heal on their own. Others need to be closed with stitches (sutures), staples, skin adhesive strips, or wound glue. Taking care of your cut lowers your risk of infection and helps your cut to heal better.  HOW TO TAKE CARE OF YOUR CUT  For stitches or staples:  · Keep the wound clean and dry.  · If you were given a bandage (dressing), you should change it at least one time per day or as told by your doctor. You should also change it if it gets wet or dirty.  · Keep the wound completely dry for the first 24 hours or as told by your doctor. After that time, you may take a shower or a bath. However, make sure that the wound is not soaked in water until after the stitches or staples have been removed.  · Clean the wound one time each day or as told by your doctor:    Wash the wound with soap and water.    Rinse the wound with water until all of the soap comes off.    Pat the wound dry with a clean towel. Do not rub the wound.  · After you clean the wound, put a thin layer of antibiotic ointment on it as told by your doctor. This ointment:    Helps to prevent infection.    Keeps the bandage from sticking to the wound.  · Have your stitches or staples removed as told by your doctor.  If your doctor used skin adhesive strips:   · Keep the wound clean and dry.  · If you were given a bandage, you should change it at least one time per day or as told by your doctor. You should also change it if it gets dirty or wet.  · Do not get the skin adhesive strips wet. You can take a shower or a bath, but be careful to keep the wound dry.  · If the wound gets wet, pat it dry with a clean towel. Do not rub the wound.  · Skin adhesive strips fall off on their own. You can trim the strips as the wound heals. Do not remove any strips that are still stuck to the wound. They will  fall off after a while.  If your doctor used wound glue:  · Try to keep your wound dry, but you may briefly wet it in the shower or bath. Do not soak the wound in water, such as by swimming.  · After you take a shower or a bath, gently pat the wound dry with a clean towel. Do not rub the wound.  · Do not do any activities that will make you really sweaty until the skin glue has fallen off on its own.  · Do not apply liquid, cream, or ointment medicine to your wound while the skin glue is still on.  · If you were given a bandage, you should change it at least one time per day or as told by your doctor. You should also change it if it gets dirty or wet.  · If a bandage is placed over the wound, do not let the tape for the bandage touch the skin glue.  · Do not pick at the glue. The skin glue usually stays on for 5-10 days. Then, it   falls off of the skin.  General Instructions   · To help prevent scarring, make sure to cover your wound with sunscreen whenever you are outside after stitches are removed, after adhesive strips are removed, or when wound glue stays in place and the wound is healed. Make sure to wear a sunscreen of at least 30 SPF.  · Take over-the-counter and prescription medicines only as told by your doctor.  · If you were given antibiotic medicine or ointment, take or apply it as told by your doctor. Do not stop using the antibiotic even if your wound is getting better.  · Do not scratch or pick at the wound.  · Keep all follow-up visits as told by your doctor. This is important.  · Check your wound every day for signs of infection. Watch for:    Redness, swelling, or pain.    Fluid, blood, or pus.  · Raise (elevate) the injured area above the level of your heart while you are sitting or lying down, if possible.  GET HELP IF:  · You got a tetanus shot and you have any of these problems at the injection site:    Swelling.    Very bad pain.    Redness.    Bleeding.  · You have a fever.  · A wound that was  closed breaks open.  · You notice a bad smell coming from your wound or your bandage.  · You notice something coming out of the wound, such as wood or glass.  · Medicine does not help your pain.  · You have more redness, swelling, or pain at the site of your wound.  · You have fluid, blood, or pus coming from your wound.  · You notice a change in the color of your skin near your wound.  · You need to change the bandage often because fluid, blood, or pus is coming from the wound.  · You start to have a new rash.  · You start to have numbness around the wound.  GET HELP RIGHT AWAY IF:  · You have very bad swelling around the wound.  · Your pain suddenly gets worse and is very bad.  · You notice painful lumps near the wound or on skin that is anywhere on your body.  · You have a red streak going away from your wound.  · The wound is on your hand or foot and you cannot move a finger or toe like you usually can.  · The wound is on your hand or foot and you notice that your fingers or toes look pale or bluish.     This information is not intended to replace advice given to you by your health care provider. Make sure you discuss any questions you have with your health care provider.     Document Released: 06/08/2007 Document Revised: 05/06/2014 Document Reviewed: 12/16/2013  [employer]vier Interactive Patient Education ©2016 [employer]

## 2015-01-09 NOTE — ED Notes (Signed)
Pt c/o laceration to bottom of right foot by stepping on metal can lid

## 2015-02-06 ENCOUNTER — Encounter (HOSPITAL_BASED_OUTPATIENT_CLINIC_OR_DEPARTMENT_OTHER): Payer: Self-pay

## 2015-02-06 ENCOUNTER — Emergency Department (HOSPITAL_BASED_OUTPATIENT_CLINIC_OR_DEPARTMENT_OTHER)
Admission: EM | Admit: 2015-02-06 | Discharge: 2015-02-06 | Disposition: A | Discharge status: Home / Self Care | Payer: Managed Care, Other (non HMO) | Attending: Emergency Medicine | Admitting: Emergency Medicine

## 2015-02-06 DIAGNOSIS — M79671 Pain in right foot: Secondary | ICD-10-CM | POA: Insufficient documentation

## 2015-02-06 DIAGNOSIS — I10 Essential (primary) hypertension: Secondary | ICD-10-CM | POA: Insufficient documentation

## 2015-02-06 DIAGNOSIS — E119 Type 2 diabetes mellitus without complications: Secondary | ICD-10-CM | POA: Insufficient documentation

## 2015-02-06 DIAGNOSIS — Z79899 Other long term (current) drug therapy: Secondary | ICD-10-CM | POA: Diagnosis not present

## 2015-02-06 DIAGNOSIS — F17210 Nicotine dependence, cigarettes, uncomplicated: Secondary | ICD-10-CM | POA: Insufficient documentation

## 2015-02-06 DIAGNOSIS — Z91040 Latex allergy status: Secondary | ICD-10-CM | POA: Diagnosis not present

## 2015-02-06 DIAGNOSIS — Z7984 Long term (current) use of oral hypoglycemic drugs: Secondary | ICD-10-CM | POA: Insufficient documentation

## 2015-02-06 DIAGNOSIS — Z8701 Personal history of pneumonia (recurrent): Secondary | ICD-10-CM | POA: Insufficient documentation

## 2015-02-06 DIAGNOSIS — Z4802 Encounter for removal of sutures: Secondary | ICD-10-CM | POA: Insufficient documentation

## 2015-02-06 DIAGNOSIS — Z791 Long term (current) use of non-steroidal anti-inflammatories (NSAID): Secondary | ICD-10-CM | POA: Insufficient documentation

## 2015-02-06 MED ORDER — HYDROCODONE-ACETAMINOPHEN 5-325 MG PO TABS: 1.0000 | ORAL_TABLET | ORAL | Status: DC | PRN

## 2015-02-06 MED FILL — HYDROCODON-APAP 5-325: 5-325 | 2 days supply | Qty: 6 | Fill #0

## 2015-02-06 NOTE — Discharge Instructions (Signed)
1. Medications: usual home medications 2. Treatment: rest, drink plenty of fluids 3. Follow Up: please followup with your primary doctor for discussion of your diagnoses and further evaluation after today's visit; if you do not have a primary care doctor use the resource guide provided to find one; please return to the ER for increased pain, redness, swelling, new or worsening symptoms   Suture Removal, Care After Refer to this sheet in the next few weeks. These instructions provide you with information on caring for yourself after your procedure. Your health care provider may also give you more specific instructions. Your treatment has been planned according to current medical practices, but problems sometimes occur. Call your health care provider if you have any problems or questions after your procedure. WHAT TO EXPECT AFTER THE PROCEDURE After your stitches (sutures) are removed, it is typical to have the following:  Some discomfort and swelling in the wound area.  Slight redness in the area. HOME CARE INSTRUCTIONS   If you have skin adhesive strips over the wound area, do not take the strips off. They will fall off on their own in a few days. If the strips remain in place after 14 days, you may remove them.  Change any bandages (dressings) at least once a day or as directed by your health care provider. If the bandage sticks, soak it off with warm, soapy water.  Apply cream or ointment only as directed by your health care provider. If using cream or ointment, wash the area with soap and water 2 times a day to remove all the cream or ointment. Rinse off the soap and pat the area dry with a clean towel.  Keep the wound area dry and clean. If the bandage becomes wet or dirty, or if it develops a bad smell, change it as soon as possible.  Continue to protect the wound from injury.  Use sunscreen when out in the sun. New scars become sunburned easily. SEEK MEDICAL CARE IF:  You have  increasing redness, swelling, or pain in the wound.  You see pus coming from the wound.  You have a fever.  You notice a bad smell coming from the wound or dressing.  Your wound breaks open (edges not staying together).   This information is not intended to replace advice given to you by your health care provider. Make sure you discuss any questions you have with your health care provider.   Document Released: 09/14/2000 Document Revised: 10/10/2012 Document Reviewed: 08/01/2012 Elsevier Interactive Patient Education [employer].

## 2015-02-06 NOTE — ED Notes (Signed)
For suture removal to right 3rd, 4th toes

## 2015-02-06 NOTE — ED Notes (Signed)
Pt directed to pharmacy to pick up prescriptions. Pt given resource guide for f/u

## 2015-02-06 NOTE — ED Provider Notes (Signed)
CSN: [id number]     Arrival date & time 02/06/15  1045 History   First MD Initiated Contact with Patient 02/06/15 1118     Chief Complaint  Patient presents with  . Suture / Staple Removal    HPI   Renee James is a 45 y.o. female with a PMH of HTN, DM who presents to the ED for suture removal. She was evaluated in the emergency department on January 6, and states she had dissolvable sutures placed to her right third and fourth toes after stepping on the lid of a metal can. Her tetanus was noted to be up to date at that time. She denies difficulty with her sutures, though reports they have not fully dissolved, prompting her to come to the ED today. She denies redness, swelling, or discharge. She reports mild continued pain, worse with movement, relieved by her prescribed pain medication.   Past Medical History  Diagnosis Date  . Hypertension   . Diabetes mellitus without complication ([place])   . Pneumonia    History reviewed. No pertinent past surgical history. No family history on file. Social History  Substance Use Topics  . Smoking status: Current Every Day Smoker -- 0.50 packs/day    Types: Cigarettes  . Smokeless tobacco: None  . Alcohol Use: Yes   OB History    No data available      Review of Systems  Musculoskeletal: Positive for arthralgias.  Skin: Positive for wound. Negative for color change.  Neurological: Negative for weakness and numbness.      Allergies  Latex; Ace inhibitors; and Tylox  Home Medications   Prior to Admission medications   Medication Sig Start Date End Date Taking? Authorizing Provider  albuterol (PROVENTIL HFA;VENTOLIN HFA) 108 (90 BASE) MCG/ACT inhaler Inhale 1-2 puffs into the lungs every 6 (six) hours as needed for wheezing or shortness of breath. 07/16/13   Fransico Meadow, PA-C  Atorvastatin Calcium (LIPITOR PO) Take 1 tablet by mouth daily.    Historical Provider, MD  Bisacodyl (LAXATIVE PO) Take 4 capsules by mouth daily as needed. For  stomach and intestine discomfort.    Historical Provider, MD  citalopram (CELEXA) 10 MG tablet Take 10 mg by mouth daily.    Historical Provider, MD  diazepam (VALIUM) 5 MG tablet Take 1 tablet (5 mg total) by mouth 2 (two) times daily. 07/23/13   Jennifer Piepenbrink, PA-C  hydrochlorothiazide (HYDRODIURIL) 25 MG tablet Take 25 mg by mouth daily.    Historical Provider, MD  HYDROcodone-acetaminophen (NORCO/VICODIN) 5-325 MG tablet Take 1-2 tablets by mouth every 4 (four) hours as needed. 02/06/15   Marella Chimes, PA-C  hydrOXYzine (ATARAX/VISTARIL) 25 MG tablet Take 1 tablet (25 mg total) by mouth every 6 (six) hours. 07/23/12   Malvin Johns, MD  losartan (COZAAR) 100 MG tablet Take 100 mg by mouth daily.    Historical Provider, MD  losartan (COZAAR) 50 MG tablet Take 50 mg by mouth daily.    Historical Provider, MD  metFORMIN (GLUCOPHAGE) 500 MG tablet Take 1,000 mg by mouth daily.    Historical Provider, MD  naproxen (NAPROSYN) 375 MG tablet Take 1 tablet (375 mg total) by mouth 2 (two) times daily. 09/13/14   Antonietta Breach, PA-C    BP 165/94 mmHg  Pulse 71  Temp(Src) 98.9 F (37.2 C) (Oral)  Resp 18  SpO2 100%  LMP 01/09/2015 Physical Exam  Constitutional: She is oriented to person, place, and time. She appears well-developed and well-nourished. No distress.  HENT:  Head: Normocephalic and atraumatic.  Right Ear: External ear normal.  Left Ear: External ear normal.  Nose: Nose normal.  Eyes: Conjunctivae and EOM are normal. Right eye exhibits no discharge. Left eye exhibits no discharge. No scleral icterus.  Neck: Normal range of motion. Neck supple.  Cardiovascular: Normal rate and regular rhythm.   Pulmonary/Chest: Effort normal and breath sounds normal. No respiratory distress.  Musculoskeletal: Normal range of motion. She exhibits no edema or tenderness.  Full ROM of right toes.  Neurological: She is alert and oriented to person, place, and time.  Skin: Skin is warm and dry.  She is not diaphoretic.  Plantar aspect of right third and fourth toes appear clean, dry, and intact. No erythema, edema, or discharge. No evidence of infection.  Psychiatric: She has a normal mood and affect. Her behavior is normal.  Nursing note and vitals reviewed.   ED Course  Procedures (including critical care time)  Labs Review Labs Reviewed - No data to display  Imaging Review No results found.     EKG Interpretation None      MDM   Final diagnoses:  Visit for suture removal    45 year old female presents for suture removal. She was evaluated in the ED on January 6, and had 4 sutures placed in her right third toe and 4 sutures placed in her right fourth toe at that time. Sutures removed from 3rd and 4th right toes (8 sutures total), which the patient tolerated well. No evidence of infection. Patient is nontoxic and well-appearing, feel she is stable for discharge at this time. Return precautions discussed. Patient to follow up with PCP. Patient verbalizes her understanding and is in agreement with plan.  BP 165/94 mmHg  Pulse 71  Temp(Src) 98.9 F (37.2 C) (Oral)  Resp 18  SpO2 100%  LMP 01/09/2015   Marella Chimes, PA-C 02/06/15 [place], MD 02/06/15 1310

## 2015-02-06 NOTE — ED Notes (Signed)
Provider at bedside

## 2015-03-18 ENCOUNTER — Telehealth: Payer: Self-pay | Admitting: *Deleted

## 2015-03-18 NOTE — Telephone Encounter (Signed)
No answer, voicemail not set up. 

## 2015-03-19 ENCOUNTER — Ambulatory Visit (INDEPENDENT_AMBULATORY_CARE_PROVIDER_SITE_OTHER): Payer: Managed Care, Other (non HMO) | Admitting: Family Medicine

## 2015-03-19 ENCOUNTER — Encounter: Payer: Self-pay | Admitting: Family Medicine

## 2015-03-19 VITALS — BP 160/100 | HR 83 | Temp 98.6°F | Wt 182.2 lb

## 2015-03-19 DIAGNOSIS — E785 Hyperlipidemia, unspecified: Secondary | ICD-10-CM | POA: Diagnosis not present

## 2015-03-19 DIAGNOSIS — F411 Generalized anxiety disorder: Secondary | ICD-10-CM

## 2015-03-19 DIAGNOSIS — I10 Essential (primary) hypertension: Secondary | ICD-10-CM | POA: Diagnosis not present

## 2015-03-19 DIAGNOSIS — IMO0001 Reserved for inherently not codable concepts without codable children: Secondary | ICD-10-CM | POA: Insufficient documentation

## 2015-03-19 DIAGNOSIS — E669 Obesity, unspecified: Secondary | ICD-10-CM | POA: Diagnosis not present

## 2015-03-19 DIAGNOSIS — E119 Type 2 diabetes mellitus without complications: Secondary | ICD-10-CM

## 2015-03-19 DIAGNOSIS — E1165 Type 2 diabetes mellitus with hyperglycemia: Secondary | ICD-10-CM

## 2015-03-19 MED ORDER — LOSARTAN POTASSIUM 100 MG PO TABS: 100.0000 mg | ORAL_TABLET | Freq: Every day | ORAL | Status: DC

## 2015-03-19 MED ORDER — CITALOPRAM HYDROBROMIDE 10 MG PO TABS: 10.0000 mg | ORAL_TABLET | Freq: Every day | ORAL | Status: DC

## 2015-03-19 MED ORDER — CITALOPRAM HYDROBROMIDE 40 MG PO TABS: 40.0000 mg | ORAL_TABLET | Freq: Every day | ORAL | Status: DC

## 2015-03-19 MED FILL — LOSARTAN POTASSIUM 100 MG T: 100 | 30 days supply | Qty: 30 | Fill #0

## 2015-03-19 NOTE — Progress Notes (Addendum)
[facility] [address] [place], [place] [address] [phone] [id number] [phone]  Date:  03/19/2015   Name:  Renee James   DOB:  [birth year]   MRN:  [id number]  PCP:  Lamar Blinks, MD    Chief Complaint: Establish Care   History of Present Illness:  Renee James is a 45 y.o. very pleasant female patient who presents with the following:  History of HTN, DM, depression, hyperlipidemia, obesity. Here today to establish care- she had been a pt of [facility] but would like to come here as her PCP moved on.    She is on metfomrin, cozaar 50 mg, HCTZ and celexa 40.   Her most recent A1c was maybe a year ago She is fasting today for labs  She has lost some weight- she is working on this. Her highest weight was 212.  Her goal is 170  She notes some decrease in her reanal function over the last several years but she has not had to see nephrology Wt Readings from Last 3 Encounters:  03/19/15 182 lb 3.2 oz (82.645 kg)  01/09/15 190 lb (86.183 kg)  09/12/14 190 lb (86.183 kg)   We will get her records from her PCP- records release done She is a smoker She has noted her BP running high as of late.  She is taking her medications and has noted bp up to 180/100  There are no active problems to display for this patient.   Past Medical History  Diagnosis Date  . Hypertension   . Diabetes mellitus without complication ([place])   . Pneumonia   . Allergy   . Anxiety   . Arthritis   . Asthma   . Depression   . Hyperlipidemia     Past Surgical History  Procedure Laterality Date  . Ectopic pregnancy surgery    . Ovarian cyst removal      Social History  Substance Use Topics  . Smoking status: Current Every Day Smoker -- 0.50 packs/day    Types: Cigarettes  . Smokeless tobacco: Not on file  . Alcohol Use: Yes    Family History  Problem Relation Age of Onset  . Diabetes Mother   . Heart disease Mother   . Kidney failure Mother   .  Stroke Father   . Kidney failure Father     Allergies  Allergen Reactions  . Latex Hives, Shortness Of Breath and Itching  . Ace Inhibitors Cough  . Tylox [Oxycodone-Acetaminophen] Itching    Medication list has been reviewed and updated.  Current Outpatient Prescriptions on File Prior to Visit  Medication Sig Dispense Refill  . citalopram (CELEXA) 10 MG tablet Take 10 mg by mouth daily.    . hydrochlorothiazide (HYDRODIURIL) 25 MG tablet Take 25 mg by mouth daily.    Marland Kitchen losartan (COZAAR) 100 MG tablet Take 100 mg by mouth daily.    . metFORMIN (GLUCOPHAGE) 500 MG tablet Take 1,000 mg by mouth daily.     No current facility-administered medications on file prior to visit.    Review of Systems:  As per HPI- otherwise negative.   Physical Examination: Filed Vitals:   03/19/15 1423  BP: 160/100  Pulse: 83  Temp: 98.6 F (37 C)    Filed Vitals:   03/19/15 1423  Weight: 182 lb 3.2 oz (82.645 kg)   Body mass index is 32.28 kg/(m^2). Ideal Body Weight:    GEN: WDWN, NAD, Non-toxic, A & O  x 3, overweight, looks well HEENT: Atraumatic, Normocephalic. Neck supple. No masses, No LAD.  Bilateral TM wnl, oropharynx normal.  PEERL,EOMI.   Ears and Nose: No external deformity. CV: RRR, No M/G/R. No JVD. No thrill. No extra heart sounds. PULM: CTA B, no wheezes, crackles, rhonchi. No retractions. No resp. distress. No accessory muscle use. EXTR: No c/c/e NEURO Normal gait.  PSYCH: Normally interactive. Conversant. Not depressed or anxious appearing.  Calm demeanor.    Assessment and Plan: Hyperlipidemia - Plan: Lipid panel  Obesity, unspecified  Controlled type 2 diabetes mellitus without complication, without long-term current use of insulin (HCC) - Plan: Hemoglobin A1c, Comprehensive metabolic panel  Essential hypertension - Plan: losartan (COZAAR) 100 MG tablet  GAD (generalized anxiety disorder) - Plan: citalopram (CELEXA) 10 MG tablet   We will increase her  losartan from 50 to 100 mg as her BP is too high- she will keep an eye on her readings and will let me know if this does not bring her under control Otherwise plan our next visit based on her labs- estimate 4-6 months Continue metformin for her DM celexa is controlling her Depression She is working on weight loss   Signed Lamar Blinks, MD  Results for orders placed or performed in visit on 03/19/15  Hemoglobin A1c  Result Value Ref Range   Hgb A1c MFr Bld 8.0 (H) 4.6 - 6.5 %  Comprehensive metabolic panel  Result Value Ref Range   Sodium 142 135 - 145 mEq/L   Potassium 4.0 3.5 - 5.1 mEq/L   Chloride 104 96 - 112 mEq/L   CO2 31 19 - 32 mEq/L   Glucose, Bld 112 (H) 70 - 99 mg/dL   BUN 12 6 - 23 mg/dL   Creatinine, Ser 1.27 (H) 0.40 - 1.20 mg/dL   Total Bilirubin 0.4 0.2 - 1.2 mg/dL   Alkaline Phosphatase 66 39 - 117 U/L   AST 12 0 - 37 U/L   ALT 8 0 - 35 U/L   Total Protein 7.1 6.0 - 8.3 g/dL   Albumin 3.9 3.5 - 5.2 g/dL   Calcium 9.3 8.4 - 10.5 mg/dL   GFR 58.09 (L) >60.00 mL/min  Lipid panel  Result Value Ref Range   Cholesterol 197 0 - 200 mg/dL   Triglycerides 181.0 (H) 0.0 - 149.0 mg/dL   HDL 39.40 >39.00 mg/dL   VLDL 36.2 0.0 - 40.0 mg/dL   LDL Cholesterol 122 (H) 0 - 99 mg/dL   Total CHOL/HDL Ratio 5    NonHDL 158.07

## 2015-03-19 NOTE — Patient Instructions (Addendum)
It was nice to see you today- we will plan our recheck depending on your labs today We increased your losartan to 100 mg- this should help bring down your blood pressure Please continue to check your blood pressures and let me know if you are not getting under 140/90  Please think about quitting smoking!

## 2015-03-19 NOTE — Progress Notes (Signed)
Pre visit review using our clinic review tool, if applicable. No additional management support is needed unless otherwise documented below in the visit note. 

## 2015-03-20 ENCOUNTER — Encounter: Payer: Self-pay | Admitting: Family Medicine

## 2015-03-20 LAB — HEMOGLOBIN A1C: Hgb A1c MFr Bld: 8 % — ABNORMAL HIGH (ref 4.6–6.5)

## 2015-03-20 LAB — COMPREHENSIVE METABOLIC PANEL
ALBUMIN: 3.9 g/dL (ref 3.5–5.2)
ALT: 8 U/L (ref 0–35)
AST: 12 U/L (ref 0–37)
Alkaline Phosphatase: 66 U/L (ref 39–117)
BUN: 12 mg/dL (ref 6–23)
CALCIUM: 9.3 mg/dL (ref 8.4–10.5)
CO2: 31 mEq/L (ref 19–32)
Chloride: 104 mEq/L (ref 96–112)
Creatinine, Ser: 1.27 mg/dL — ABNORMAL HIGH (ref 0.40–1.20)
GFR: 58.09 mL/min — ABNORMAL LOW (ref >60.00)
Glucose, Bld: 112 mg/dL — ABNORMAL HIGH (ref 70–99)
POTASSIUM: 4 meq/L (ref 3.5–5.1)
SODIUM: 142 meq/L (ref 135–145)
Total Bilirubin: 0.4 mg/dL (ref 0.2–1.2)
Total Protein: 7.1 g/dL (ref 6.0–8.3)

## 2015-03-20 LAB — LIPID PANEL
CHOLESTEROL: 197 mg/dL (ref 0–200)
HDL: 39.4 mg/dL (ref >39.00)
LDL CALC: 122 mg/dL — AB (ref 0–99)
NonHDL: 158.07
TRIGLYCERIDES: 181 mg/dL — AB (ref 0.0–149.0)
Total CHOL/HDL Ratio: 5
VLDL: 36.2 mg/dL (ref 0.0–40.0)

## 2015-04-01 ENCOUNTER — Telehealth: Payer: Self-pay | Admitting: Family Medicine

## 2015-04-01 DIAGNOSIS — E1165 Type 2 diabetes mellitus with hyperglycemia: Principal | ICD-10-CM

## 2015-04-01 DIAGNOSIS — IMO0001 Reserved for inherently not codable concepts without codable children: Secondary | ICD-10-CM

## 2015-04-01 MED ORDER — DULAGLUTIDE 0.75 MG/0.5ML ~~LOC~~ SOAJ: 0.7500 mg | SUBCUTANEOUS | Status: DC

## 2015-04-01 NOTE — Telephone Encounter (Signed)
Left detailed message explaining unable to email lab results, but would gladly mail results or place at front desk for pick up. Advised patient to call back and let us know what we needed to do.

## 2015-04-01 NOTE — Telephone Encounter (Signed)
Pt called because she did not receive her recent labs that I sent to her. Discussed her A1c and other labs.  She would like to try truliicty after discussion- she will check out the website and make sure she thinks that she can do this medicaion.  I will send this in for her and will resend a copy of her labs to her.    Wt Readings from Last 3 Encounters:  03/19/15 182 lb 3.2 oz (82.645 kg)  01/09/15 190 lb (86.183 kg)  09/12/14 190 lb (86.183 kg)     Results for orders placed or performed in visit on 03/19/15  Hemoglobin A1c  Result Value Ref Range   Hgb A1c MFr Bld 8.0 (H) 4.6 - 6.5 %  Comprehensive metabolic panel  Result Value Ref Range   Sodium 142 135 - 145 mEq/L   Potassium 4.0 3.5 - 5.1 mEq/L   Chloride 104 96 - 112 mEq/L   CO2 31 19 - 32 mEq/L   Glucose, Bld 112 (H) 70 - 99 mg/dL   BUN 12 6 - 23 mg/dL   Creatinine, Ser 1.27 (H) 0.40 - 1.20 mg/dL   Total Bilirubin 0.4 0.2 - 1.2 mg/dL   Alkaline Phosphatase 66 39 - 117 U/L   AST 12 0 - 37 U/L   ALT 8 0 - 35 U/L   Total Protein 7.1 6.0 - 8.3 g/dL   Albumin 3.9 3.5 - 5.2 g/dL   Calcium 9.3 8.4 - 10.5 mg/dL   GFR 58.09 (L) >60.00 mL/min  Lipid panel  Result Value Ref Range   Cholesterol 197 0 - 200 mg/dL   Triglycerides 181.0 (H) 0.0 - 149.0 mg/dL   HDL 39.40 >39.00 mg/dL   VLDL 36.2 0.0 - 40.0 mg/dL   LDL Cholesterol 122 (H) 0 - 99 mg/dL   Total CHOL/HDL Ratio 5    NonHDL 158.07

## 2015-04-01 NOTE — Telephone Encounter (Signed)
Caller name: Nollie Relation to pt: self  Call back number: [phone] Pharmacy:  Reason for call:  [email or web address] pt called stating is wanting to have her last  lab result and her next appt to her email (appt is on July 09, 2015 at 1:00pm) sent to email. Please advise.

## 2015-04-28 ENCOUNTER — Encounter (HOSPITAL_COMMUNITY)
Admission: EM | Disposition: A | Discharge status: Home / Self Care | Payer: Self-pay | Source: Home / Self Care | Attending: Emergency Medicine

## 2015-04-28 ENCOUNTER — Inpatient Hospital Stay (HOSPITAL_COMMUNITY): Payer: Managed Care, Other (non HMO) | Admitting: Anesthesiology

## 2015-04-28 ENCOUNTER — Ambulatory Visit (HOSPITAL_BASED_OUTPATIENT_CLINIC_OR_DEPARTMENT_OTHER)
Admission: EM | Admit: 2015-04-28 | Discharge: 2015-04-28 | Disposition: A | Discharge status: Home / Self Care | Payer: Managed Care, Other (non HMO) | Attending: Family Medicine | Admitting: Family Medicine

## 2015-04-28 ENCOUNTER — Encounter (HOSPITAL_BASED_OUTPATIENT_CLINIC_OR_DEPARTMENT_OTHER): Payer: Self-pay | Admitting: Emergency Medicine

## 2015-04-28 ENCOUNTER — Emergency Department (HOSPITAL_BASED_OUTPATIENT_CLINIC_OR_DEPARTMENT_OTHER): Payer: Managed Care, Other (non HMO)

## 2015-04-28 DIAGNOSIS — R102 Pelvic and perineal pain: Secondary | ICD-10-CM | POA: Diagnosis not present

## 2015-04-28 DIAGNOSIS — J45909 Unspecified asthma, uncomplicated: Secondary | ICD-10-CM | POA: Diagnosis not present

## 2015-04-28 DIAGNOSIS — F17210 Nicotine dependence, cigarettes, uncomplicated: Secondary | ICD-10-CM | POA: Insufficient documentation

## 2015-04-28 DIAGNOSIS — R52 Pain, unspecified: Secondary | ICD-10-CM | POA: Insufficient documentation

## 2015-04-28 DIAGNOSIS — I10 Essential (primary) hypertension: Secondary | ICD-10-CM

## 2015-04-28 DIAGNOSIS — Z7984 Long term (current) use of oral hypoglycemic drugs: Secondary | ICD-10-CM | POA: Diagnosis not present

## 2015-04-28 DIAGNOSIS — E119 Type 2 diabetes mellitus without complications: Secondary | ICD-10-CM | POA: Insufficient documentation

## 2015-04-28 DIAGNOSIS — N83519 Torsion of ovary and ovarian pedicle, unspecified side: Secondary | ICD-10-CM | POA: Diagnosis present

## 2015-04-28 HISTORY — PX: LAPAROSCOPY: SHX197

## 2015-04-28 LAB — COMPREHENSIVE METABOLIC PANEL
ALT: 12 U/L — AB (ref 14–54)
ANION GAP: 12 (ref 5–15)
AST: 21 U/L (ref 15–41)
Albumin: 3.9 g/dL (ref 3.5–5.0)
Alkaline Phosphatase: 61 U/L (ref 38–126)
BUN: 12 mg/dL (ref 6–20)
CHLORIDE: 107 mmol/L (ref 101–111)
CO2: 19 mmol/L — AB (ref 22–32)
Calcium: 9 mg/dL (ref 8.9–10.3)
Creatinine, Ser: 1.18 mg/dL — ABNORMAL HIGH (ref 0.44–1.00)
GFR calc non Af Amer: 54 mL/min — ABNORMAL LOW (ref >60)
Glucose, Bld: 99 mg/dL (ref 65–99)
Potassium: 3.4 mmol/L — ABNORMAL LOW (ref 3.5–5.1)
SODIUM: 138 mmol/L (ref 135–145)
Total Bilirubin: 0.5 mg/dL (ref 0.3–1.2)
Total Protein: 7.1 g/dL (ref 6.5–8.1)

## 2015-04-28 LAB — CBC WITH DIFFERENTIAL/PLATELET
BASOS PCT: 0 %
Basophils Absolute: 0 10*3/uL (ref 0.0–0.1)
EOS ABS: 0.6 10*3/uL (ref 0.0–0.7)
Eosinophils Relative: 7 %
HCT: 37.5 % (ref 36.0–46.0)
Hemoglobin: 13.1 g/dL (ref 12.0–15.0)
Lymphocytes Relative: 42 %
Lymphs Abs: 3.5 10*3/uL (ref 0.7–4.0)
MCH: 28.6 pg (ref 26.0–34.0)
MCHC: 34.9 g/dL (ref 30.0–36.0)
MCV: 81.9 fL (ref 78.0–100.0)
MONO ABS: 0.3 10*3/uL (ref 0.1–1.0)
Monocytes Relative: 3 %
NEUTROS PCT: 48 %
Neutro Abs: 3.9 10*3/uL (ref 1.7–7.7)
PLATELETS: 303 10*3/uL (ref 150–400)
RBC: 4.58 MIL/uL (ref 3.87–5.11)
RDW: 13.3 % (ref 11.5–15.5)
WBC: 8.3 10*3/uL (ref 4.0–10.5)

## 2015-04-28 LAB — URINALYSIS, ROUTINE W REFLEX MICROSCOPIC
Bilirubin Urine: NEGATIVE
Glucose, UA: NEGATIVE mg/dL
Ketones, ur: NEGATIVE mg/dL
LEUKOCYTES UA: NEGATIVE
NITRITE: NEGATIVE
PH: 7.5 (ref 5.0–8.0)
Protein, ur: NEGATIVE mg/dL
SPECIFIC GRAVITY, URINE: 1.003 — AB (ref 1.005–1.030)

## 2015-04-28 LAB — URINE MICROSCOPIC-ADD ON

## 2015-04-28 LAB — GLUCOSE, CAPILLARY
GLUCOSE-CAPILLARY: 120 mg/dL — AB (ref 65–99)
Glucose-Capillary: 91 mg/dL (ref 65–99)

## 2015-04-28 LAB — PREGNANCY, URINE: Preg Test, Ur: NEGATIVE

## 2015-04-28 SURGERY — LAPAROSCOPY, DIAGNOSTIC
Anesthesia: General | Site: Abdomen

## 2015-04-28 MED ORDER — SUGAMMADEX SODIUM 500 MG/5ML IV SOLN
INTRAVENOUS | Status: DC | PRN
  Administered 2015-04-28: 340 mg via INTRAVENOUS

## 2015-04-28 MED ORDER — FENTANYL CITRATE (PF) 250 MCG/5ML IJ SOLN
INTRAMUSCULAR | Status: AC
  Filled 2015-04-28: qty 5

## 2015-04-28 MED ORDER — MORPHINE SULFATE (PF) 4 MG/ML IV SOLN
4.0000 mg | Freq: Once | INTRAVENOUS | Status: AC
  Administered 2015-04-28: 4 mg via INTRAVENOUS
  Filled 2015-04-28: qty 1

## 2015-04-28 MED ORDER — LIDOCAINE HCL (CARDIAC) 20 MG/ML IV SOLN
INTRAVENOUS | Status: DC | PRN
  Administered 2015-04-28: 100 mg via INTRAVENOUS

## 2015-04-28 MED ORDER — MIDAZOLAM HCL 2 MG/2ML IJ SOLN
INTRAMUSCULAR | Status: AC
  Filled 2015-04-28: qty 2

## 2015-04-28 MED ORDER — MIDAZOLAM HCL 2 MG/2ML IJ SOLN
INTRAMUSCULAR | Status: DC | PRN
  Administered 2015-04-28: 1 mg via INTRAVENOUS

## 2015-04-28 MED ORDER — BUPIVACAINE HCL (PF) 0.25 % IJ SOLN
INTRAMUSCULAR | Status: AC
  Filled 2015-04-28: qty 30

## 2015-04-28 MED ORDER — BUPIVACAINE HCL (PF) 0.25 % IJ SOLN
INTRAMUSCULAR | Status: DC | PRN
  Administered 2015-04-28: 5 mL

## 2015-04-28 MED ORDER — FAMOTIDINE IN NACL 20-0.9 MG/50ML-% IV SOLN
20.0000 mg | Freq: Once | INTRAVENOUS | Status: AC
  Administered 2015-04-28: 20 mg via INTRAVENOUS
  Filled 2015-04-28: qty 50

## 2015-04-28 MED ORDER — LABETALOL HCL 5 MG/ML IV SOLN
10.0000 mg | INTRAVENOUS | Status: AC | PRN
  Administered 2015-04-28: 5 mg via INTRAVENOUS
  Administered 2015-04-28: 10 mg via INTRAVENOUS
  Administered 2015-04-28: 5 mg via INTRAVENOUS
  Filled 2015-04-28: qty 4

## 2015-04-28 MED ORDER — HYDROCODONE-IBUPROFEN 5-200 MG PO TABS: 1.0000 | ORAL_TABLET | Freq: Three times a day (TID) | ORAL | Status: DC | PRN

## 2015-04-28 MED ORDER — PROPOFOL 10 MG/ML IV BOLUS
INTRAVENOUS | Status: AC
Start: 2015-04-28 — End: 2015-04-28
  Filled 2015-04-28: qty 20

## 2015-04-28 MED ORDER — ONDANSETRON HCL 4 MG/2ML IJ SOLN: 4.0000 mg | Freq: Once | INTRAMUSCULAR | Status: DC | PRN

## 2015-04-28 MED ORDER — KETOROLAC TROMETHAMINE 30 MG/ML IJ SOLN: 30.0000 mg | Freq: Once | INTRAMUSCULAR | Status: DC

## 2015-04-28 MED ORDER — CITRIC ACID-SODIUM CITRATE 334-500 MG/5ML PO SOLN
30.0000 mL | Freq: Once | ORAL | Status: AC
  Administered 2015-04-28: 30 mL via ORAL
  Filled 2015-04-28: qty 15

## 2015-04-28 MED ORDER — NEOSTIGMINE METHYLSULFATE 10 MG/10ML IV SOLN
INTRAVENOUS | Status: AC
  Filled 2015-04-28: qty 1

## 2015-04-28 MED ORDER — LIDOCAINE HCL (CARDIAC) 20 MG/ML IV SOLN
INTRAVENOUS | Status: AC
  Filled 2015-04-28: qty 5

## 2015-04-28 MED ORDER — LABETALOL HCL 5 MG/ML IV SOLN
INTRAVENOUS | Status: AC
  Administered 2015-04-28: 5 mg via INTRAVENOUS
  Filled 2015-04-28: qty 4

## 2015-04-28 MED ORDER — PROPOFOL 10 MG/ML IV BOLUS
INTRAVENOUS | Status: DC | PRN
  Administered 2015-04-28: 170 mg via INTRAVENOUS

## 2015-04-28 MED ORDER — ROCURONIUM BROMIDE 100 MG/10ML IV SOLN
INTRAVENOUS | Status: AC
  Filled 2015-04-28: qty 1

## 2015-04-28 MED ORDER — OXYCODONE HCL 5 MG PO TABS
5.0000 mg | ORAL_TABLET | Freq: Once | ORAL | Status: AC
  Administered 2015-04-28: 5 mg via ORAL

## 2015-04-28 MED ORDER — OXYCODONE HCL 5 MG PO TABS
ORAL_TABLET | ORAL | Status: AC
  Filled 2015-04-28: qty 1

## 2015-04-28 MED ORDER — SUGAMMADEX SODIUM 500 MG/5ML IV SOLN
INTRAVENOUS | Status: AC
  Filled 2015-04-28: qty 5

## 2015-04-28 MED ORDER — FENTANYL CITRATE (PF) 250 MCG/5ML IJ SOLN
INTRAMUSCULAR | Status: DC | PRN
  Administered 2015-04-28 (×2): 50 ug via INTRAVENOUS
  Administered 2015-04-28: 100 ug via INTRAVENOUS
  Administered 2015-04-28: 50 ug via INTRAVENOUS

## 2015-04-28 MED ORDER — FENTANYL CITRATE (PF) 100 MCG/2ML IJ SOLN: 25.0000 ug | INTRAMUSCULAR | Status: DC | PRN

## 2015-04-28 MED ORDER — ONDANSETRON HCL 4 MG/2ML IJ SOLN
INTRAMUSCULAR | Status: AC
  Filled 2015-04-28: qty 2

## 2015-04-28 MED ORDER — GLYCOPYRROLATE 0.2 MG/ML IJ SOLN
INTRAMUSCULAR | Status: DC | PRN
  Administered 2015-04-28: 0.1 mg via INTRAVENOUS

## 2015-04-28 MED ORDER — ESMOLOL HCL 100 MG/10ML IV SOLN
INTRAVENOUS | Status: DC | PRN
  Administered 2015-04-28: 30 mg via INTRAVENOUS
  Administered 2015-04-28: 20 mg via INTRAVENOUS

## 2015-04-28 MED ORDER — HYDROMORPHONE HCL 1 MG/ML IJ SOLN
1.0000 mg | Freq: Once | INTRAMUSCULAR | Status: AC
  Administered 2015-04-28: 1 mg via INTRAVENOUS
  Filled 2015-04-28: qty 1

## 2015-04-28 MED ORDER — LABETALOL HCL 5 MG/ML IV SOLN
5.0000 mg | INTRAVENOUS | Status: DC | PRN
  Administered 2015-04-28 (×4): 5 mg via INTRAVENOUS

## 2015-04-28 MED ORDER — ONDANSETRON HCL 4 MG/2ML IJ SOLN
4.0000 mg | Freq: Once | INTRAMUSCULAR | Status: AC
  Administered 2015-04-28: 4 mg via INTRAVENOUS
  Filled 2015-04-28: qty 2

## 2015-04-28 MED ORDER — ROCURONIUM BROMIDE 100 MG/10ML IV SOLN
INTRAVENOUS | Status: DC | PRN
  Administered 2015-04-28: 35 mg via INTRAVENOUS

## 2015-04-28 MED ORDER — DEXAMETHASONE SODIUM PHOSPHATE 4 MG/ML IJ SOLN
INTRAMUSCULAR | Status: DC | PRN
  Administered 2015-04-28: 4 mg via INTRAVENOUS

## 2015-04-28 MED ORDER — MEPERIDINE HCL 25 MG/ML IJ SOLN: 6.2500 mg | INTRAMUSCULAR | Status: DC | PRN

## 2015-04-28 MED ORDER — SCOPOLAMINE 1 MG/3DAYS TD PT72
MEDICATED_PATCH | TRANSDERMAL | Status: AC
  Filled 2015-04-28: qty 1

## 2015-04-28 MED ORDER — LABETALOL HCL 5 MG/ML IV SOLN
INTRAVENOUS | Status: DC | PRN
  Administered 2015-04-28 (×2): 5 mg via INTRAVENOUS

## 2015-04-28 MED ORDER — ESMOLOL HCL 100 MG/10ML IV SOLN
INTRAVENOUS | Status: AC
  Filled 2015-04-28: qty 10

## 2015-04-28 MED ORDER — IOPAMIDOL (ISOVUE-300) INJECTION 61%
100.0000 mL | Freq: Once | INTRAVENOUS | Status: AC | PRN
  Administered 2015-04-28: 100 mL via INTRAVENOUS

## 2015-04-28 MED ORDER — DEXAMETHASONE SODIUM PHOSPHATE 10 MG/ML IJ SOLN
INTRAMUSCULAR | Status: AC
  Filled 2015-04-28: qty 1

## 2015-04-28 MED ORDER — SCOPOLAMINE 1 MG/3DAYS TD PT72
MEDICATED_PATCH | TRANSDERMAL | Status: DC | PRN
  Administered 2015-04-28: 1 via TRANSDERMAL

## 2015-04-28 MED ORDER — OXYCODONE HCL 5 MG PO CAPS: 5.0000 mg | ORAL_CAPSULE | ORAL | Status: DC | PRN

## 2015-04-28 MED ORDER — ONDANSETRON HCL 4 MG/2ML IJ SOLN
INTRAMUSCULAR | Status: DC | PRN
  Administered 2015-04-28: 4 mg via INTRAVENOUS

## 2015-04-28 MED ORDER — LACTATED RINGERS IV SOLN
INTRAVENOUS | Status: DC | PRN
  Administered 2015-04-28 (×2): via INTRAVENOUS

## 2015-04-28 SURGICAL SUPPLY — 27 items
CABLE HIGH FREQUENCY MONO STRZ (ELECTRODE) IMPLANT
CLOTH BEACON ORANGE TIMEOUT ST (SAFETY) ×3 IMPLANT
DRSG COVADERM PLUS 2X2 (GAUZE/BANDAGES/DRESSINGS) ×6 IMPLANT
DRSG OPSITE POSTOP 3X4 (GAUZE/BANDAGES/DRESSINGS) ×3 IMPLANT
DURAPREP 26ML APPLICATOR (WOUND CARE) ×3 IMPLANT
GLOVE BIOGEL PI IND STRL 7.0 (GLOVE) ×2 IMPLANT
GLOVE BIOGEL PI INDICATOR 7.0 (GLOVE) ×4
GLOVE SURG SS PI 7.0 STRL IVOR (GLOVE) ×15 IMPLANT
GOWN STRL REUS W/TWL LRG LVL3 (GOWN DISPOSABLE) ×9 IMPLANT
LIQUID BAND (GAUZE/BANDAGES/DRESSINGS) ×3 IMPLANT
NS IRRIG 1000ML POUR BTL (IV SOLUTION) ×3 IMPLANT
PACK LAPAROSCOPY BASIN (CUSTOM PROCEDURE TRAY) ×3 IMPLANT
PAD TRENDELENBURG POSITION (MISCELLANEOUS) ×3 IMPLANT
SCALPEL HARMONIC ACE (MISCELLANEOUS) ×3 IMPLANT
SET IRRIG TUBING LAPAROSCOPIC (IRRIGATION / IRRIGATOR) IMPLANT
SLEEVE XCEL OPT CAN 5 100 (ENDOMECHANICALS) ×3 IMPLANT
SUT VIC AB 3-0 X1 27 (SUTURE) ×3 IMPLANT
SUT VICRYL 0 UR6 27IN ABS (SUTURE) ×6 IMPLANT
SUT VICRYL 4-0 PS2 18IN ABS (SUTURE) ×3 IMPLANT
TOWEL OR 17X24 6PK STRL BLUE (TOWEL DISPOSABLE) ×6 IMPLANT
TRAY FOLEY BAG SILVER LF 16FR (SET/KITS/TRAYS/PACK) ×3 IMPLANT
TROCAR BALLN 12MMX100 BLUNT (TROCAR) IMPLANT
TROCAR OPTI TIP 5M 100M (ENDOMECHANICALS) ×6 IMPLANT
TROCAR XCEL DIL TIP R 11M (ENDOMECHANICALS) IMPLANT
TROCAR XCEL NON-BLD 5MMX100MML (ENDOMECHANICALS) ×3 IMPLANT
WARMER LAPAROSCOPE (MISCELLANEOUS) ×3 IMPLANT
WATER STERILE IRR 1000ML POUR (IV SOLUTION) ×3 IMPLANT

## 2015-04-28 NOTE — Anesthesia Preprocedure Evaluation (Addendum)
Anesthesia Evaluation  Patient identified by MRN, date of birth, ID band Patient awake    Reviewed: Allergy & Precautions, H&P , NPO status , Patient's Chart, lab work & pertinent test results, reviewed documented beta blocker date and time   Airway Mallampati: I  TM Distance: >3 FB Neck ROM: full    Dental no notable dental hx. (+) Teeth Intact   Pulmonary Current Smoker,    Pulmonary exam normal        Cardiovascular hypertension, Normal cardiovascular exam     Neuro/Psych negative neurological ROS     GI/Hepatic negative GI ROS, Neg liver ROS,   Endo/Other  diabetes, Well Controlled, Type 2, Oral Hypoglycemic Agents  Renal/GU negative Renal ROS     Musculoskeletal   Abdominal (+) + obese,   Peds  Hematology negative hematology ROS (+)   Anesthesia Other Findings   Reproductive/Obstetrics negative OB ROS                           Anesthesia Physical Anesthesia Plan  ASA: II  Anesthesia Plan: General   Post-op Pain Management:    Induction: Intravenous  Airway Management Planned: Oral ETT  Additional Equipment:   Intra-op Plan:   Post-operative Plan: Extubation in OR  Informed Consent: I have reviewed the patients History and Physical, chart, labs and discussed the procedure including the risks, benefits and alternatives for the proposed anesthesia with the patient or authorized representative who has indicated his/her understanding and acceptance.   Dental Advisory Given  Plan Discussed with: CRNA and Surgeon  Anesthesia Plan Comments:       Anesthesia Quick Evaluation

## 2015-04-28 NOTE — Discharge Instructions (Signed)
Diagnostic Laparoscopy A diagnostic laparoscopy is a procedure to diagnose diseases in the abdomen. During the procedure, a thin, lighted, pencil-sized instrument called a laparoscope is inserted into the abdomen through an incision. The laparoscope allows your health care provider to look at the organs inside your body. LET [facility] CARE PROVIDER KNOW ABOUT:  Any allergies you have.  All medicines you are taking, including vitamins, herbs, eye drops, creams, and over-the-counter medicines.  Previous problems you or members of your family have had with the use of anesthetics.  Any blood disorders you have.  Previous surgeries you have had.  Medical conditions you have. RISKS AND COMPLICATIONS  Generally, this is a safe procedure. However, problems can occur, which may include:  Infection.  Bleeding.  Damage to other organs.  Allergic reaction to the anesthetics used during the procedure. BEFORE THE PROCEDURE  Do not eat or drink anything after midnight on the night before the procedure or as directed by your health care provider.  Ask your health care provider about:  Changing or stopping your regular medicines.  Taking medicines such as aspirin and ibuprofen. These medicines can thin your blood. Do not take these medicines before your procedure if your health care provider instructs you not to.  Plan to have someone take you home after the procedure. PROCEDURE  You may be given a medicine to help you relax (sedative).  You will be given a medicine to make you sleep (general anesthetic).  Your abdomen will be inflated with a gas. This will make your organs easier to see.  Small incisions will be made in your abdomen.  A laparoscope and other small instruments will be inserted into the abdomen through the incisions.  A tissue sample may be removed from an organ in the abdomen for examination.  The instruments will be removed from the abdomen.  The gas will be  released.  The incisions will be closed with stitches (sutures). AFTER THE PROCEDURE  Your blood pressure, heart rate, breathing rate, and blood oxygen level will be monitored often until the medicines you were given have worn off.   This information is not intended to replace advice given to you by your health care provider. Make sure you discuss any questions you have with your health care provider.   Document Released: 03/28/2000 Document Revised: 09/10/2014 Document Reviewed: 08/02/2013 Elsevier Interactive Patient Education 2016 [place] Anesthesia Home Care Instructions  Activity: Get plenty of rest for the remainder of the day. A responsible adult should stay with you for 24 hours following the procedure.  For the next 24 hours, DO NOT: -Drive a car -Paediatric nurse -Drink alcoholic beverages -Take any medication unless instructed by your physician -Make any legal decisions or sign important papers.  Meals: Start with liquid foods such as gelatin or soup. Progress to regular foods as tolerated. Avoid greasy, spicy, heavy foods. If nausea and/or vomiting occur, drink only clear liquids until the nausea and/or vomiting subsides. Call your physician if vomiting continues.  Special Instructions/Symptoms: Your throat may feel dry or sore from the anesthesia or the breathing tube placed in your throat during surgery. If this causes discomfort, gargle with warm salt water. The discomfort should disappear within 24 hours.  If you had a scopolamine patch placed behind your ear for the management of post- operative nausea and/or vomiting:  1. The medication in the patch is effective for 72 hours, after which it should be removed.  Wrap patch in a  tissue and discard in the trash. Wash hands thoroughly with soap and water. 2. You may remove the patch earlier than 72 hours if you experience unpleasant side effects which may include dry mouth, dizziness or visual  disturbances. 3. Avoid touching the patch. Wash your hands with soap and water after contact with the patch.

## 2015-04-28 NOTE — ED Notes (Deleted)
Patient to treatment room vi

## 2015-04-28 NOTE — ED Notes (Signed)
abd with nausea   Onset  0800

## 2015-04-28 NOTE — Anesthesia Postprocedure Evaluation (Signed)
Anesthesia Post Note  Patient: Renee James  Procedure(s) Performed: Procedure(s) (LRB): LAPAROSCOPY DIAGNOSTIC (N/A)  Patient location during evaluation: PACU Anesthesia Type: General Level of consciousness: awake and alert Pain management: pain level controlled Vital Signs Assessment: post-procedure vital signs reviewed and stable Respiratory status: spontaneous breathing, nonlabored ventilation and respiratory function stable Cardiovascular status: blood pressure returned to baseline and stable Postop Assessment: no signs of nausea or vomiting Anesthetic complications: no     Last Vitals:  Filed Vitals:   04/28/15 2015 04/28/15 2030  BP: 149/88 138/83  Pulse: 74 83  Temp:  36.7 C  Resp: 16 18    Last Pain:  Filed Vitals:   04/28/15 2054  PainSc: 7    Pain Goal:                 Tiajuana Amass

## 2015-04-28 NOTE — ED Notes (Signed)
MD at bedside. 

## 2015-04-28 NOTE — ED Notes (Signed)
Spoke with Marcello Moores at [facility] for consult to OBGYN per VORB from Dr Maryan Rued

## 2015-04-28 NOTE — Anesthesia Procedure Notes (Signed)
Procedure Name: Intubation Date/Time: 04/28/2015 5:11 PM Performed by: Flossie Dibble Pre-anesthesia Checklist: Patient identified, Patient being monitored, Timeout performed, Emergency Drugs available and Suction available Patient Re-evaluated:Patient Re-evaluated prior to inductionOxygen Delivery Method: Circle system utilized Preoxygenation: Pre-oxygenation with 100% oxygen Intubation Type: IV induction Ventilation: Mask ventilation without difficulty Laryngoscope Size: Miller and 2 Grade View: Grade I Tube type: Oral Tube size: 7.0 mm Number of attempts: 1 Airway Equipment and Method: Stylet Placement Confirmation: ETT inserted through vocal cords under direct vision,  positive ETCO2 and breath sounds checked- equal and bilateral Secured at: 24 cm Tube secured with: Tape Dental Injury: Teeth and Oropharynx as per pre-operative assessment

## 2015-04-28 NOTE — Transfer of Care (Signed)
Immediate Anesthesia Transfer of Care Note  Patient: Renee James  Procedure(s) Performed: Procedure(s): LAPAROSCOPY DIAGNOSTIC (N/A)  Patient Location: PACU  Anesthesia Type:General  Level of Consciousness: awake, alert  and oriented  Airway & Oxygen Therapy: Patient Spontanous Breathing and Patient connected to face mask oxygen  Post-op Assessment: Report given to RN and Post -op Vital signs reviewed and stable  Post vital signs: Reviewed and stable  Last Vitals:  Filed Vitals:   04/28/15 1615 04/28/15 1641  BP: 188/103 164/98  Pulse: 59 64  Temp:    Resp:      Complications: No apparent anesthesia complications

## 2015-04-28 NOTE — MAU Note (Signed)
Patient presents from [facility] for torsion surgery

## 2015-04-28 NOTE — Op Note (Addendum)
Preoperative diagnosis: Pelvic pain, Probable Ovarian Torsion - right  Postoperative diagnosis: Pelvic pain, no evidence of Ovarian Torsion  Procedure: Diagnostic laparoscopy  Surgeon: Darron Doom, MD  Anesthesia: Daphene Calamity, MD  Findings: Uterus has adhesions to the right pelvic side wall and right ovary has adhesions to uterus, but is normal in size and appearance, no evidence of torsion. The left side reveals adhesions of sigmoid to the posterior uterus on the left and a normal left tube and ovary. The appendix was not seen with confidence. Normal appearing liver edge. No obvious endometriosis. There is a large adhesion of omentum to the anterior abdominal wall along the midline and from the umbilicus down.  Estimated blood loss: Minimal  Complications: None known  Specimens: None  Reason for procedure: 45 y.o. [id number] with h/o 2 prior pelvic surgeries for ectopic and ovarian cyst via Pfannenstiel who presents with acute abdominal pain and is transferred from [facility] after finding of ovarian torsion via ultrasound. CT found normal appearance. Pain is accompanying her menses.  Procedure: Patient was taken to the operating room was placed in dorsal lithotomy in [place]. She was prepped and draped in the usual sterile fashion. A timeout was performed. The patient had SCDs in place. Foley catheter is used to drain bladder. Speculum was placed inside the vagina. The cervix was visualized and grasped anteriorly with a single-tooth tenaculum. A Hulka tenaculum was placed through the cervix for uterine manipulation. The single tooth tenaculum and speculum were removed from the vagina.  Attention was then turned to the abdomen. Five cc of 0.25% Marcaine was injected at the umbilicus. Two Allis clamps were used to tent up the skin of the umbilicus a vertical one half centimeter incision was made here. The fascia was incised with the knife  And the peritoneum was entered sharply with  this incision. Two edges of the fascia were tagged with a 0 Vicryl suture on a UR 6 needle. A Hassan trocar was placed through this incision and a pneumoperitoneum was created. The patient was then placed in Trendelenburg. Adhesions were noted anteriorly and only one side of the abdomen was visible at a time. Coming around the adhesions, the pelvis was inspected in a systematic fashion. The findings are described in detail above. The upper abdomen was inspected the liver edge gallbladder and stomach appeared normal. Given the chronicity of the adhesions and the normal appearance of the ovaries bilaterally, previously normal appendix noted previously the procedure was terminated. It was felt more harm than good would be done by attempting to take down all the adhesions.  All instrument, needle  and lap counts were correct x 2. The patient was awakened to recovery in stable condition.  Calil Amor SMD 04/28/2015 5:53 PM

## 2015-04-28 NOTE — ED Provider Notes (Addendum)
CSN: [id number]     Arrival date & time 04/28/15  [id number] History   First MD Initiated Contact with Patient 04/28/15 0935     No chief complaint on file.    (Consider location/radiation/quality/duration/timing/severity/associated sxs/prior Treatment) Patient is a 45 y.o. female presenting with abdominal pain. The history is provided by the patient.  Abdominal Pain Pain location:  RLQ Pain quality: sharp, shooting, squeezing and stabbing   Pain radiates to:  Anus Pain severity:  Severe Onset quality:  Sudden Duration:  2 hours Timing:  Constant Progression:  Unchanged Chronicity:  New Context comment:  Pt states just finished her menses yesterday and had slight spotting and then heavier bleeding this morning but no pain until 8am when she developed sudden pain Relieved by:  None tried Worsened by:  Movement, palpation and position changes Ineffective treatments:  None tried Associated symptoms: nausea and vaginal bleeding   Associated symptoms: no chest pain, no constipation, no diarrhea, no dysuria, no fever, no hematuria, no shortness of breath and no vomiting   Risk factors: no alcohol abuse, no NSAID use and no recent hospitalization   Risk factors comment:  Hx of ectopic pregnancy years ago   Past Medical History  Diagnosis Date  . Hypertension   . Diabetes mellitus without complication ([place])   . Pneumonia   . Allergy   . Anxiety   . Arthritis   . Asthma   . Depression   . Hyperlipidemia    Past Surgical History  Procedure Laterality Date  . Ectopic pregnancy surgery    . Ovarian cyst removal     Family History  Problem Relation Age of Onset  . Diabetes Mother   . Heart disease Mother   . Kidney failure Mother   . Stroke Father   . Kidney failure Father    Social History  Substance Use Topics  . Smoking status: Current Every Day Smoker -- 0.50 packs/day    Types: Cigarettes  . Smokeless tobacco: Not on file  . Alcohol Use: Yes   OB History    No data  available     Review of Systems  Constitutional: Negative for fever.  Respiratory: Negative for shortness of breath.   Cardiovascular: Negative for chest pain.  Gastrointestinal: Positive for nausea and abdominal pain. Negative for vomiting, diarrhea and constipation.  Genitourinary: Positive for vaginal bleeding. Negative for dysuria and hematuria.  All other systems reviewed and are negative.     Allergies  Latex; Ace inhibitors; and Tylox  Home Medications   Prior to Admission medications   Medication Sig Start Date End Date Taking? Authorizing Provider  citalopram (CELEXA) 40 MG tablet Take 1 tablet (40 mg total) by mouth daily. 03/19/15   Gay Filler Copland, MD  Dulaglutide (TRULICITY) A999333 0000000 SOPN Inject 0.75 mg into the skin once a week. 04/01/15   Gay Filler Copland, MD  hydrochlorothiazide (HYDRODIURIL) 25 MG tablet Take 25 mg by mouth daily.    Historical Provider, MD  losartan (COZAAR) 100 MG tablet Take 1 tablet (100 mg total) by mouth daily. 03/19/15   Gay Filler Copland, MD  metFORMIN (GLUCOPHAGE) 500 MG tablet Take 1,000 mg by mouth 2 (two) times daily with a meal.     Historical Provider, MD   BP 163/98 mmHg  Pulse 72  Temp(Src) 98.4 F (36.9 C) (Oral)  Resp 18  Ht 5\' 3"  (1.6 m)  Wt 182 lb (82.555 kg)  BMI 32.25 kg/m2  SpO2 100% Physical Exam  Constitutional: She  is oriented to person, place, and time. She appears well-developed and well-nourished. She appears distressed.  Appears uncomfortable  HENT:  Head: Normocephalic and atraumatic.  Mouth/Throat: Oropharynx is clear and moist.  Eyes: Conjunctivae and EOM are normal. Pupils are equal, round, and reactive to light.  Neck: Normal range of motion. Neck supple.  Cardiovascular: Normal rate, regular rhythm and intact distal pulses.   No murmur heard. Pulmonary/Chest: Effort normal and breath sounds normal. No respiratory distress. She has no wheezes. She has no rales.  Abdominal: Soft. She exhibits no  distension. There is tenderness in the right lower quadrant. There is rebound and guarding. There is no CVA tenderness.    Genitourinary: Cervix exhibits no motion tenderness, no discharge and no friability. Right adnexum displays tenderness. Right adnexum displays no mass and no fullness. Left adnexum displays tenderness. Left adnexum displays no mass and no fullness. There is bleeding in the vagina. No tenderness in the vagina.  Mild tenderness with palpation of bilateral ovaries but no severe pain or masses  Musculoskeletal: Normal range of motion. She exhibits no edema or tenderness.  Neurological: She is alert and oriented to person, place, and time.  Skin: Skin is warm and dry. No rash noted. No erythema.  Psychiatric: She has a normal mood and affect. Her behavior is normal.  Nursing note and vitals reviewed.   ED Course  Procedures (including critical care time) Labs Review Labs Reviewed  COMPREHENSIVE METABOLIC PANEL - Abnormal; Notable for the following:    Potassium 3.4 (*)    CO2 19 (*)    Creatinine, Ser 1.18 (*)    ALT 12 (*)    GFR calc non Af Amer 54 (*)    All other components within normal limits  URINALYSIS, ROUTINE W REFLEX MICROSCOPIC (NOT AT [facility]) - Abnormal; Notable for the following:    APPearance CLOUDY (*)    Specific Gravity, Urine 1.003 (*)    Hgb urine dipstick LARGE (*)    All other components within normal limits  URINE MICROSCOPIC-ADD ON - Abnormal; Notable for the following:    Squamous Epithelial / LPF 0-5 (*)    Bacteria, UA FEW (*)    All other components within normal limits  CBC WITH DIFFERENTIAL/PLATELET  PREGNANCY, URINE    Imaging Review Ct Abdomen Pelvis W Contrast  04/28/2015  CLINICAL DATA:  Diffuse abdominal and pelvic pain with nausea beginning this morning. EXAM: CT ABDOMEN AND PELVIS WITH CONTRAST TECHNIQUE: Multidetector CT imaging of the abdomen and pelvis was performed using the standard protocol following bolus administration  of intravenous contrast. CONTRAST:  154mL ISOVUE-300 IOPAMIDOL (ISOVUE-300) INJECTION 61% COMPARISON:  None. FINDINGS: The visualized lung bases are clear. The liver, gallbladder, spleen, left adrenal gland, and kidneys are unremarkable. The pancreatic duct is upper limits of normal in size without pancreatic mass or other focal abnormality identified. There is right adrenal gland calcification which may reflect prior hemorrhage or infection. Oral contrast is present in multiple nondilated loops of small bowel. There is no evidence of bowel obstruction or inflammation. The appendix is identified in the right lower quadrant and is unremarkable. The bladder, uterus, and ovaries are unremarkable. The abdominal aorta is normal in caliber with mild atherosclerotic plaque. No free fluid or enlarged lymph nodes are identified. Moderately severe lower lumbar facet arthrosis is noted. IMPRESSION: 1. No acute abnormality identified in the abdomen or pelvis. 2. Right adrenal calcification, most often seen in the setting of prior infection or hemorrhage. Electronically Signed  By: Logan Bores M.D.   On: 04/28/2015 12:32   I have personally reviewed and evaluated these images and lab results as part of my medical decision-making.   EKG Interpretation None      MDM   Final diagnoses:  Pain  Ovarian torsion    Patient is a 45 year old female presenting today with sudden onset of abrupt abdominal pain that started at 8 AM. Her only other symptoms were heavier vaginal bleeding this morning. She finished her menses 2 days ago and had slight spotting yesterday and then this morning had heavier bleeding but no pain. The pain started spontaneously right before she started to go to work. It is severe in nature and radiates down into her rectum. She denies any urinary complaints or GI complaints. The pain is so intense it causes nausea. She has never had symptoms like this before except when she had an ectopic pregnancy  when she was 45 years old.  Significant tenderness in the right lower quadrant with some rebound and guarding. Given the abrupt nature of these symptoms concerning for ovarian torsion, ruptured ovarian cyst, kidney stone. Lower suspicion for appendicitis given the abrupt nature of it but could be a perforation. Patient given pain control. CBC, CMP, UA, UPT pending will need to do a pelvic exam and then will either get a CT or an ultrasound based on findings.  10:59 AM Pelvic exam without significant findings.  No unilateral severe ovarian tenderness or mass palpated.  Pain improved with meds.  2:19 PM CT negative for acute findings however given patient's abrupt onset of pain will do an ultrasound to rule out ovarian torsion.  2:28 PM [place] with concern for right ovarian torsion.  Will discuss with gyn and transfer pt to [facility]'s p[facility].  Blanchie Dessert, MD 04/28/15 1428  Blanchie Dessert, MD 04/28/15 1439

## 2015-04-28 NOTE — H&P (Addendum)
Renee James is an 45 y.o. [id number] female.   Chief Complaint: abdominal pain HPI: Patient with h/o HTN, DM had acute onset abdominal pain this am. Taken to [facility] and had rebound and CT which was negative. U/S showed ovarian torsion, with no blood flow to the right ovary. Per ED exam had rebound. She does report nausea, without vomiting. Given Dilaudid prior transfer  Past Medical History  Diagnosis Date  . Hypertension   . Diabetes mellitus without complication ([place])   . Pneumonia   . Allergy   . Anxiety   . Arthritis   . Asthma   . Depression   . Hyperlipidemia     Past Surgical History  Procedure Laterality Date  . Ectopic pregnancy surgery    . Ovarian cyst removal      Family History  Problem Relation Age of Onset  . Diabetes Mother   . Heart disease Mother   . Kidney failure Mother   . Stroke Father   . Kidney failure Father    Social History:  reports that she has been smoking Cigarettes.  She has been smoking about 0.50 packs per day. She does not have any smokeless tobacco history on file. She reports that she drinks alcohol. Her drug history is not on file.  Allergies:  Allergies  Allergen Reactions  . Latex Hives, Shortness Of Breath and Itching  . Ace Inhibitors Cough  . Tylox [Oxycodone-Acetaminophen] Itching    No current facility-administered medications on file prior to encounter.   Current Outpatient Prescriptions on File Prior to Encounter  Medication Sig Dispense Refill  . citalopram (CELEXA) 40 MG tablet Take 1 tablet (40 mg total) by mouth daily. 30 tablet 11  . Dulaglutide (TRULICITY) A999333 0000000 SOPN Inject 0.75 mg into the skin once a week. 4 pen 12  . hydrochlorothiazide (HYDRODIURIL) 25 MG tablet Take 25 mg by mouth daily.    Marland Kitchen losartan (COZAAR) 100 MG tablet Take 1 tablet (100 mg total) by mouth daily. 30 tablet 11  . metFORMIN (GLUCOPHAGE) 500 MG tablet Take 1,000 mg by mouth 2 (two) times daily with a meal.       Pertinent items are  noted in HPI.  Blood pressure 175/97, pulse 56, temperature 98.5 F (36.9 C), temperature source Oral, resp. rate 18, height 5\' 3"  (1.6 m), weight 182 lb (82.555 kg), last menstrual period 04/14/2015, SpO2 98 %. BP 175/97 mmHg  Pulse 56  Temp(Src) 98.5 F (36.9 C) (Oral)  Resp 18  Ht 5\' 3"  (1.6 m)  Wt 182 lb (82.555 kg)  BMI 32.25 kg/m2  SpO2 98%  LMP 04/14/2015 General appearance: alert, cooperative and appears stated age Neck: supple, symmetrical, trachea midline Lungs: normal effort Heart: regular rate and rhythm, S1, S2 normal, no murmur, click, rub or gallop Abdomen: soft, minimally tender post dilaudid Skin: Skin color, texture, turgor normal. No rashes or lesions Neurologic: Grossly normal   Lab Results  Component Value Date   WBC 8.3 04/28/2015   HGB 13.1 04/28/2015   HCT 37.5 04/28/2015   MCV 81.9 04/28/2015   PLT 303 04/28/2015   Lab Results  Component Value Date   PREGTESTUR NEGATIVE 04/28/2015   US Transvaginal Non-ob  04/28/2015  CLINICAL DATA:  Right lower quadrant pain with vaginal spotting. EXAM: TRANSABDOMINAL AND TRANSVAGINAL ULTRASOUND OF PELVIS DOPPLER ULTRASOUND OF OVARIES TECHNIQUE: Both transabdominal and transvaginal ultrasound examinations of the pelvis were performed. Transabdominal technique was performed for global imaging of the pelvis including uterus, ovaries, adnexal regions,  and pelvic cul-de-sac. It was necessary to proceed with endovaginal exam following the transabdominal exam to visualize the uterus, endometrium and bilateral ovaries. Color and duplex Doppler ultrasound was utilized to evaluate blood flow to the ovaries. COMPARISON:  None. FINDINGS: Uterus Measurements: 9.9 x 5 x 5.8 cm. There is a 1.8 x 1.4 x 1.7 cm uterine fibroid in the anterior left fundal uterus. Nabothian cysts are identified within the cervix. Endometrium Thickness: 5 mm.  No focal abnormality visualized. Right ovary Measurements: 3 x 1.8 x 1.9 cm. Normal appearance/no  adnexal mass. The ultrasound technologist reports inability to pick up color flow in the right ovary with no demonstrable Doppler waveform identified. Left ovary Measurements: 3.8 x 1.8 x 2.4 cm. There is a 2.2 cm complex cyst in the left ovary. Positive arterial and venous flow are identified within the left ovary with positive color flow. Pulsed Doppler evaluation of left ovary demonstrates normal low-resistance arterial and venous waveforms. Other findings No abnormal free fluid. IMPRESSION: Ultrasound technologist reports inability to pick up color flow in the right ovary with no demonstrable Doppler waveform identified, ovarian torsion is not excluded. Uterine fibroid. These results will be called to the ordering clinician or representative by the Radiologist Assistant, and communication documented in the PACS or zVision Dashboard. Electronically Signed   By: Abelardo Diesel M.D.   On: 04/28/2015 14:19   US Pelvis Complete  04/28/2015  CLINICAL DATA:  Right lower quadrant pain with vaginal spotting. EXAM: TRANSABDOMINAL AND TRANSVAGINAL ULTRASOUND OF PELVIS DOPPLER ULTRASOUND OF OVARIES TECHNIQUE: Both transabdominal and transvaginal ultrasound examinations of the pelvis were performed. Transabdominal technique was performed for global imaging of the pelvis including uterus, ovaries, adnexal regions, and pelvic cul-de-sac. It was necessary to proceed with endovaginal exam following the transabdominal exam to visualize the uterus, endometrium and bilateral ovaries. Color and duplex Doppler ultrasound was utilized to evaluate blood flow to the ovaries. COMPARISON:  None. FINDINGS: Uterus Measurements: 9.9 x 5 x 5.8 cm. There is a 1.8 x 1.4 x 1.7 cm uterine fibroid in the anterior left fundal uterus. Nabothian cysts are identified within the cervix. Endometrium Thickness: 5 mm.  No focal abnormality visualized. Right ovary Measurements: 3 x 1.8 x 1.9 cm. Normal appearance/no adnexal mass. The ultrasound  technologist reports inability to pick up color flow in the right ovary with no demonstrable Doppler waveform identified. Left ovary Measurements: 3.8 x 1.8 x 2.4 cm. There is a 2.2 cm complex cyst in the left ovary. Positive arterial and venous flow are identified within the left ovary with positive color flow. Pulsed Doppler evaluation of left ovary demonstrates normal low-resistance arterial and venous waveforms. Other findings No abnormal free fluid. IMPRESSION: Ultrasound technologist reports inability to pick up color flow in the right ovary with no demonstrable Doppler waveform identified, ovarian torsion is not excluded. Uterine fibroid. These results will be called to the ordering clinician or representative by the Radiologist Assistant, and communication documented in the PACS or zVision Dashboard. Electronically Signed   By: Abelardo Diesel M.D.   On: 04/28/2015 14:19   Ct Abdomen Pelvis W Contrast  04/28/2015  CLINICAL DATA:  Diffuse abdominal and pelvic pain with nausea beginning this morning. EXAM: CT ABDOMEN AND PELVIS WITH CONTRAST TECHNIQUE: Multidetector CT imaging of the abdomen and pelvis was performed using the standard protocol following bolus administration of intravenous contrast. CONTRAST:  147mL ISOVUE-300 IOPAMIDOL (ISOVUE-300) INJECTION 61% COMPARISON:  None. FINDINGS: The visualized lung bases are clear. The liver, gallbladder, spleen, left  adrenal gland, and kidneys are unremarkable. The pancreatic duct is upper limits of normal in size without pancreatic mass or other focal abnormality identified. There is right adrenal gland calcification which may reflect prior hemorrhage or infection. Oral contrast is present in multiple nondilated loops of small bowel. There is no evidence of bowel obstruction or inflammation. The appendix is identified in the right lower quadrant and is unremarkable. The bladder, uterus, and ovaries are unremarkable. The abdominal aorta is normal in caliber with  mild atherosclerotic plaque. No free fluid or enlarged lymph nodes are identified. Moderately severe lower lumbar facet arthrosis is noted. IMPRESSION: 1. No acute abnormality identified in the abdomen or pelvis. 2. Right adrenal calcification, most often seen in the setting of prior infection or hemorrhage. Electronically Signed   By: Logan Bores M.D.   On: 04/28/2015 12:32   [place] Art/ven Flow Abd Pelv Doppler  04/28/2015  CLINICAL DATA:  Right lower quadrant pain with vaginal spotting. EXAM: TRANSABDOMINAL AND TRANSVAGINAL ULTRASOUND OF PELVIS DOPPLER ULTRASOUND OF OVARIES TECHNIQUE: Both transabdominal and transvaginal ultrasound examinations of the pelvis were performed. Transabdominal technique was performed for global imaging of the pelvis including uterus, ovaries, adnexal regions, and pelvic cul-de-sac. It was necessary to proceed with endovaginal exam following the transabdominal exam to visualize the uterus, endometrium and bilateral ovaries. Color and duplex Doppler ultrasound was utilized to evaluate blood flow to the ovaries. COMPARISON:  None. FINDINGS: Uterus Measurements: 9.9 x 5 x 5.8 cm. There is a 1.8 x 1.4 x 1.7 cm uterine fibroid in the anterior left fundal uterus. Nabothian cysts are identified within the cervix. Endometrium Thickness: 5 mm.  No focal abnormality visualized. Right ovary Measurements: 3 x 1.8 x 1.9 cm. Normal appearance/no adnexal mass. The ultrasound technologist reports inability to pick up color flow in the right ovary with no demonstrable Doppler waveform identified. Left ovary Measurements: 3.8 x 1.8 x 2.4 cm. There is a 2.2 cm complex cyst in the left ovary. Positive arterial and venous flow are identified within the left ovary with positive color flow. Pulsed Doppler evaluation of left ovary demonstrates normal low-resistance arterial and venous waveforms. Other findings No abnormal free fluid. IMPRESSION: Ultrasound technologist reports inability to pick up color flow  in the right ovary with no demonstrable Doppler waveform identified, ovarian torsion is not excluded. Uterine fibroid. These results will be called to the ordering clinician or representative by the Radiologist Assistant, and communication documented in the PACS or zVision Dashboard. Electronically Signed   By: Abelardo Diesel M.D.   On: 04/28/2015 14:19     Assessment/Plan Active Problems:   Ovarian torsion  To the OR for evaluation.   Risks include but are not limited to bleeding, infection, injury to surrounding structures, including bowel, bladder and ureters, blood clots, and death.  Likelihood of success is high.    Tamyrah Burbage S 04/28/2015, 4:11 PM

## 2015-04-29 ENCOUNTER — Encounter (HOSPITAL_COMMUNITY): Payer: Self-pay | Admitting: Family Medicine

## 2015-04-30 ENCOUNTER — Telehealth: Payer: Self-pay | Admitting: Family Medicine

## 2015-04-30 NOTE — Telephone Encounter (Signed)
Caller name: Siani Relationship to patient: self Can be reached: [phone]   Reason for call:  Admitted to [facility] on Tuesday for ER surgery. Pt states that she does not want to take the Trulicity shots anymore. Pt requesting call from provider.

## 2015-04-30 NOTE — Telephone Encounter (Signed)
Called her back- she wondered if the trulicity caused her recent ovarian torsion as "abd pain" is listed as a SE.  Reassured her that the trulicity did not cause this, but she can stop taking it if she likes.  She also feels like since she started trulicity her celexa makes her stomach hurt.  In any case, she may stop trulicity if desired and see me for DM check in 2 months

## 2015-05-04 ENCOUNTER — Inpatient Hospital Stay (HOSPITAL_COMMUNITY)
Admission: AD | Admit: 2015-05-04 | Discharge: 2015-05-04 | Disposition: A | Discharge status: Home / Self Care | Payer: Managed Care, Other (non HMO) | Source: Ambulatory Visit | Attending: Family Medicine | Admitting: Family Medicine

## 2015-05-04 ENCOUNTER — Encounter (HOSPITAL_COMMUNITY): Payer: Self-pay

## 2015-05-04 ENCOUNTER — Encounter: Payer: Self-pay | Admitting: Obstetrics & Gynecology

## 2015-05-04 ENCOUNTER — Telehealth: Payer: Self-pay

## 2015-05-04 DIAGNOSIS — E119 Type 2 diabetes mellitus without complications: Secondary | ICD-10-CM | POA: Diagnosis not present

## 2015-05-04 DIAGNOSIS — X58XXXA Exposure to other specified factors, initial encounter: Secondary | ICD-10-CM | POA: Insufficient documentation

## 2015-05-04 DIAGNOSIS — K5909 Other constipation: Secondary | ICD-10-CM

## 2015-05-04 DIAGNOSIS — T8189XA Other complications of procedures, not elsewhere classified, initial encounter: Secondary | ICD-10-CM | POA: Diagnosis not present

## 2015-05-04 DIAGNOSIS — F17210 Nicotine dependence, cigarettes, uncomplicated: Secondary | ICD-10-CM | POA: Insufficient documentation

## 2015-05-04 DIAGNOSIS — I10 Essential (primary) hypertension: Secondary | ICD-10-CM | POA: Insufficient documentation

## 2015-05-04 LAB — URINALYSIS, ROUTINE W REFLEX MICROSCOPIC
BILIRUBIN URINE: NEGATIVE
GLUCOSE, UA: NEGATIVE mg/dL
KETONES UR: NEGATIVE mg/dL
Nitrite: NEGATIVE
PROTEIN: NEGATIVE mg/dL
Specific Gravity, Urine: 1.015 (ref 1.005–1.030)
pH: 5.5 (ref 5.0–8.0)

## 2015-05-04 LAB — URINE MICROSCOPIC-ADD ON

## 2015-05-04 MED ORDER — BISACODYL 5 MG PO TBEC
10.0000 mg | DELAYED_RELEASE_TABLET | Freq: Every day | ORAL | Status: DC | PRN
  Administered 2015-05-04: 10 mg via ORAL
  Filled 2015-05-04 (×2): qty 2

## 2015-05-04 MED ORDER — OXYCODONE-ACETAMINOPHEN 5-325 MG PO TABS: 1.0000 | ORAL_TABLET | Freq: Once | ORAL | Status: DC

## 2015-05-04 MED ORDER — OXYCODONE-ACETAMINOPHEN 5-325 MG PO TABS
1.0000 | ORAL_TABLET | Freq: Once | ORAL | Status: AC
  Administered 2015-05-04: 1 via ORAL
  Filled 2015-05-04: qty 1

## 2015-05-04 NOTE — MAU Note (Addendum)
Surgery 6 days ago, laparoscopy by Dr. Kennon Rounds. States "my ovary wasn't getting any blood flow." States she is having a lot of pain ("pulling and tugging")  and hurts to walk. States she is"breathing hard." feels nauseated.

## 2015-05-04 NOTE — Discharge Instructions (Signed)

## 2015-05-04 NOTE — MAU Provider Note (Signed)
History   S/P laproscopin surg on 25th with noted adhesions. Pt states gas pains are bad and has not had bowel movement but is passing some gas.  CSN: [id number]  Arrival date & time 05/04/15  1742   First Provider Initiated Contact with Patient 05/04/15 1952      Chief Complaint  Patient presents with  . Post-op Problem    HPI  Past Medical History  Diagnosis Date  . Hypertension   . Diabetes mellitus without complication ([place])   . Pneumonia   . Allergy   . Anxiety   . Arthritis   . Asthma   . Depression   . Hyperlipidemia     Past Surgical History  Procedure Laterality Date  . Ectopic pregnancy surgery    . Ovarian cyst removal    . Laparoscopy N/A 04/28/2015    Procedure: LAPAROSCOPY DIAGNOSTIC;  Surgeon: Donnamae Jude, MD;  Location: [facility];  Service: Gynecology;  Laterality: N/A;    Family History  Problem Relation Age of Onset  . Diabetes Mother   . Heart disease Mother   . Kidney failure Mother   . Stroke Father   . Kidney failure Father     Social History  Substance Use Topics  . Smoking status: Current Every Day Smoker -- 0.50 packs/day    Types: Cigarettes  . Smokeless tobacco: None  . Alcohol Use: Yes    OB History    Gravida Para Term Preterm AB TAB SAB Ectopic Multiple Living   5    2 1  1  3       Review of Systems  Constitutional: Negative.   HENT: Negative.   Eyes: Negative.   Respiratory: Negative.   Cardiovascular: Negative.   Gastrointestinal: Positive for nausea, abdominal pain and constipation.  Endocrine: Negative.   Genitourinary: Negative.   Musculoskeletal: Negative.   Skin: Negative.   Allergic/Immunologic: Negative.   Neurological: Negative.   Hematological: Negative.   Psychiatric/Behavioral: Negative.     Allergies  Latex; Ace inhibitors; Other; and Tylox  Home Medications  No current outpatient prescriptions on file.  BP 151/100 mmHg  Pulse 72  Temp(Src) 99 F (37.2 C) (Oral)  Resp 16  SpO2 100%  LMP  04/14/2015  Physical Exam  Constitutional: She is oriented to person, place, and time. She appears well-developed and well-nourished.  HENT:  Head: Normocephalic.  Eyes: Pupils are equal, round, and reactive to light.  Cardiovascular: Normal rate, regular rhythm, normal heart sounds and intact distal pulses.   Pulmonary/Chest: Effort normal and breath sounds normal.  Abdominal: Soft. Bowel sounds are normal.  Musculoskeletal: Normal range of motion.  Neurological: She is alert and oriented to person, place, and time. She has normal reflexes.  Skin: Skin is warm and dry.  Psychiatric: She has a normal mood and affect. Her behavior is normal. Judgment and thought content normal.    MAU Course  Procedures (including critical care time)  Labs Reviewed  URINALYSIS, [place] (NOT AT [facility]) - Abnormal; Notable for the following:    Hgb urine dipstick SMALL (*)    Leukocytes, UA TRACE (*)    All other components within normal limits  URINE MICROSCOPIC-ADD ON - Abnormal; Notable for the following:    Squamous Epithelial / LPF 6-30 (*)    Bacteria, UA MANY (*)    All other components within normal limits   No results found.   No diagnosis found.    MDM  Umbilical incision intact, no  redness,swelling, or draiage. Abd soft, bowel sounds x 4. VSS Dx: post op constipation.  P: LOC, pain management, d/c home

## 2015-05-06 ENCOUNTER — Other Ambulatory Visit: Payer: Self-pay | Admitting: Student

## 2015-05-06 ENCOUNTER — Other Ambulatory Visit: Payer: Self-pay | Admitting: Advanced Practice Midwife

## 2015-05-06 ENCOUNTER — Telehealth: Payer: Self-pay | Admitting: *Deleted

## 2015-05-06 DIAGNOSIS — G8918 Other acute postprocedural pain: Secondary | ICD-10-CM

## 2015-05-06 MED ORDER — OXYCODONE-ACETAMINOPHEN 5-325 MG PO TABS: 1.0000 | ORAL_TABLET | ORAL | Status: DC | PRN

## 2015-05-06 MED ORDER — OXYCODONE-ACETAMINOPHEN 5-325 MG PO TABS: 1.0000 | ORAL_TABLET | Freq: Four times a day (QID) | ORAL | Status: DC | PRN

## 2015-05-06 NOTE — Telephone Encounter (Signed)
Forwarded to Dr.Copland. JG//CMA  

## 2015-05-06 NOTE — Progress Notes (Signed)
Pt presents with original rx for percocet as written by Daiva Nakayama CNM. Per pharmacy, rx does not have appropriate dosing schedule. Will redo rx & shred initial copy.

## 2015-05-06 NOTE — Progress Notes (Signed)
Having trouble printing Rx for Percocet  Seabron Spates, CNM

## 2015-05-11 ENCOUNTER — Ambulatory Visit (INDEPENDENT_AMBULATORY_CARE_PROVIDER_SITE_OTHER): Payer: Managed Care, Other (non HMO) | Admitting: Obstetrics & Gynecology

## 2015-05-11 ENCOUNTER — Encounter: Payer: Self-pay | Admitting: Obstetrics & Gynecology

## 2015-05-11 VITALS — BP 143/93 | HR 84 | Wt 176.3 lb

## 2015-05-11 DIAGNOSIS — N83519 Torsion of ovary and ovarian pedicle, unspecified side: Secondary | ICD-10-CM

## 2015-05-11 DIAGNOSIS — Z09 Encounter for follow-up examination after completed treatment for conditions other than malignant neoplasm: Secondary | ICD-10-CM

## 2015-05-11 NOTE — Progress Notes (Signed)
Subjective:wants to return to work     Renee James is a 45 y.o. female who presents to the clinic 2 weeks status post laparoscopy for ovarian torsion for torsion. Eating a regular diet without difficulty. Bowel movements are normal. Pain is controlled without any medications. Mild soreness only  The following portions of the patient's history were reviewed and updated as appropriate: allergies, current medications, past family history, past medical history, past social history, past surgical history and problem list.  Review of Systems Pertinent items are noted in HPI.    Objective:    BP 143/93 mmHg  Pulse 84  Wt 176 lb 4.8 oz (79.969 kg)  LMP 04/14/2015 General:  alert, cooperative and no distress  Abdomen: soft, bowel sounds active, non-tender  Incision:   healing well, no drainage, no erythema, no hernia, no seroma, no swelling, no dehiscence, incision well approximated     Assessment:    Doing well postoperatively. Operative findings again reviewed. Pathology report discussed.    Plan:    1. Continue any current medications. 2. Wound care discussed. 3. Activity restrictions: none 4. Anticipated return to work: now.  Woodroe Mode, MD 05/11/2015

## 2015-05-13 ENCOUNTER — Encounter: Payer: Self-pay | Admitting: Family Medicine

## 2015-05-13 DIAGNOSIS — N289 Disorder of kidney and ureter, unspecified: Secondary | ICD-10-CM

## 2015-05-13 HISTORY — DX: Disorder of kidney and ureter, unspecified: N28.9

## 2015-07-09 ENCOUNTER — Ambulatory Visit: Payer: Managed Care, Other (non HMO) | Admitting: Family Medicine

## 2015-07-09 DIAGNOSIS — Z0289 Encounter for other administrative examinations: Secondary | ICD-10-CM

## 2015-07-10 ENCOUNTER — Encounter: Payer: Self-pay | Admitting: Family Medicine

## 2015-07-13 ENCOUNTER — Encounter: Payer: Self-pay | Admitting: Family Medicine

## 2015-07-13 ENCOUNTER — Ambulatory Visit (INDEPENDENT_AMBULATORY_CARE_PROVIDER_SITE_OTHER): Payer: Managed Care, Other (non HMO) | Admitting: Family Medicine

## 2015-07-13 VITALS — BP 165/98 | HR 66 | Temp 98.6°F | Ht 63.0 in | Wt 183.6 lb

## 2015-07-13 DIAGNOSIS — E1165 Type 2 diabetes mellitus with hyperglycemia: Secondary | ICD-10-CM

## 2015-07-13 DIAGNOSIS — I10 Essential (primary) hypertension: Secondary | ICD-10-CM | POA: Diagnosis not present

## 2015-07-13 DIAGNOSIS — M79643 Pain in unspecified hand: Secondary | ICD-10-CM

## 2015-07-13 DIAGNOSIS — E669 Obesity, unspecified: Secondary | ICD-10-CM

## 2015-07-13 DIAGNOSIS — IMO0001 Reserved for inherently not codable concepts without codable children: Secondary | ICD-10-CM

## 2015-07-13 DIAGNOSIS — G8929 Other chronic pain: Secondary | ICD-10-CM

## 2015-07-13 LAB — BASIC METABOLIC PANEL
BUN: 11 mg/dL (ref 6–23)
CHLORIDE: 106 meq/L (ref 96–112)
CO2: 27 meq/L (ref 19–32)
Calcium: 9 mg/dL (ref 8.4–10.5)
Creatinine, Ser: 1.21 mg/dL — ABNORMAL HIGH (ref 0.40–1.20)
GFR: 61.35 mL/min (ref >60.00)
GLUCOSE: 154 mg/dL — AB (ref 70–99)
POTASSIUM: 3.7 meq/L (ref 3.5–5.1)
SODIUM: 138 meq/L (ref 135–145)

## 2015-07-13 LAB — HEMOGLOBIN A1C: Hgb A1c MFr Bld: 7.5 % — ABNORMAL HIGH (ref 4.6–6.5)

## 2015-07-13 MED ORDER — TRAMADOL HCL 50 MG PO TABS: 50.0000 mg | ORAL_TABLET | Freq: Every evening | ORAL | Status: DC | PRN

## 2015-07-13 MED FILL — traMADol HCL 50 MG TABS: 50 | 30 days supply | Qty: 30 | Fill #0

## 2015-07-13 NOTE — Progress Notes (Addendum)
[facility] [address] [place], [place] [address] [id number] [phone]  Date:  07/13/2015   Name:  Renee James   DOB:  1970/08/24   MRN:  [id number]  PCP:  Lamar Blinks, MD    Chief Complaint: Follow-up   History of Present Illness:  Renee James is a 45 y.o. very pleasant female patient who presents with the following:  History of DM, HTN, obesity. Here today for a recheck.  Last seen 4 months ago to establish care.   She has been working on her weight and had started to lose some, but gained it back (see below)  At her last visit we increased her losartan from 50- 100 mg, A1c was 8% and I added trulicity for her.   She did have an ovaian torsion in late April, had a laparsocopy for repair and has done well. Her husband thought that her ovarian torsion was due to the trulicity and "he threw it away, but it was helping me."  She notes that her glucose was much better while she was on trulicyt and she was losing weight.  Has gained it back since stopped trulicity and she would like to start it again  She has noted her BP doing better at home- she did not take her medications yet today however.  She also states that she has a history of chronic pain in her hands and feet which I was not aware of at our last visit. States that she was treated by a provider in [place] in the past who had her on tramadol and a muscle relaxer -the name of which she cannot recall. She is not taking oxycodone any longer (following operation)  BP Readings from Last 3 Encounters:  07/13/15 173/93  05/11/15 143/93  05/04/15 145/86    Wt Readings from Last 3 Encounters:  07/13/15 183 lb 9.6 oz (83.28 kg)  05/11/15 176 lb 4.8 oz (79.969 kg)  04/28/15 182 lb (82.555 kg)     Patient Active Problem List   Diagnosis Date Noted  . Renal insufficiency 05/13/2015  . Ovarian torsion 04/28/2015  . Pain   . Hyperlipidemia 03/19/2015  . Obesity,  unspecified 03/19/2015  . Controlled type 2 diabetes mellitus without complication, without long-term current use of insulin ([place]) 03/19/2015  . Essential hypertension 03/19/2015  . GAD (generalized anxiety disorder) 03/19/2015    Past Medical History  Diagnosis Date  . Hypertension   . Diabetes mellitus without complication ([place])   . Pneumonia   . Allergy   . Anxiety   . Arthritis   . Asthma   . Depression   . Hyperlipidemia   . Renal insufficiency 05/13/2015  . Bell's palsy     Past Surgical History  Procedure Laterality Date  . Ectopic pregnancy surgery    . Ovarian cyst removal    . Laparoscopy N/A 04/28/2015    Procedure: LAPAROSCOPY DIAGNOSTIC;  Surgeon: Donnamae Jude, MD;  Location: [facility];  Service: Gynecology;  Laterality: N/A;  . Tubal ligation      Social History  Substance Use Topics  . Smoking status: Current Every Day Smoker -- 0.50 packs/day    Types: Cigarettes  . Smokeless tobacco: None  . Alcohol Use: Yes    Family History  Problem Relation Age of Onset  . Diabetes Mother   . Heart disease Mother   . Kidney failure Mother   . Stroke Father   . Kidney  failure Father   . Breast cancer      Allergies  Allergen Reactions  . Latex Hives, Shortness Of Breath and Itching  . Ace Inhibitors Cough  . 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  Current Outpatient Prescriptions on File James to Visit  Medication Sig Dispense Refill  . citalopram (CELEXA) 40 MG tablet Take 1 tablet (40 mg total) by mouth daily. 30 tablet 11  . Dulaglutide (TRULICITY) A999333 0000000 SOPN Inject 0.75 mg into the skin once a week. 4 pen 12  . hydrochlorothiazide (HYDRODIURIL) 25 MG tablet Take 25 mg by mouth daily.    Marland Kitchen losartan (COZAAR) 100 MG tablet Take 1 tablet (100 mg total) by mouth daily. 30 tablet 11  . metFORMIN (GLUCOPHAGE-XR) 500 MG 24 hr tablet Take 4 tablets by mouth  daily.    Marland Kitchen oxycodone (OXY-IR) 5 MG capsule Take 1 capsule (5 mg total) by mouth every 4 (four) hours as needed. 20 capsule 0  . oxyCODONE-acetaminophen (PERCOCET/ROXICET) 5-325 MG tablet Take 1-2 tablets by mouth every 6 (six) hours as needed. 30 tablet 0   No current facility-administered medications on file James to visit.    Review of Systems:  As per HPI- otherwise negative. LMP- current   Physical Examination: Filed Vitals:   07/13/15 1001  BP: 173/93  Pulse: 66  Temp: 98.6 F (37 C)   Filed Vitals:   07/13/15 1001  Height: 5\' 3"  (1.6 m)  Weight: 183 lb 9.6 oz (83.28 kg)   Body mass index is 32.53 kg/(m^2). Ideal Body Weight: Weight in (lb) to have BMI = 25: 140.8  GEN: WDWN, NAD, Non-toxic, A & O x 3, obese, looks well HEENT: Atraumatic, Normocephalic. Neck supple. No masses, No LAD. Ears and Nose: No external deformity. CV: RRR, No M/G/R. No JVD. No thrill. No extra heart sounds. PULM: CTA B, no wheezes, crackles, rhonchi. No retractions. No resp. distress. No accessory muscle use. ABD: S, NT, ND, +BS. No rebound. No HSM.  Benign belly EXTR: No c/c/e NEURO Normal gait.  PSYCH: Normally interactive. Conversant. Not depressed or anxious appearing.  Calm demeanor.    Assessment and Plan: Chronic hand pain, unspecified laterality - Plan: traMADol (ULTRAM) 50 MG tablet  Uncontrolled type 2 diabetes mellitus without complication, without long-term current use of insulin (H[facility]) - Plan: Hemoglobin [id number], Basic Metabolic Panel (BMET)  Obesity, unspecified  Essential hypertension - Plan: Basic Metabolic Panel (BMET)  Discussed with pt- per my knowledge there is no connection between trulicity and the ovarian torsion BP is high but she did not take her medication today.  She will get back on her medication and let me know how her BP looks at home I think you are fine to go back on Trulicity. As far as I know there is no connection between the ovarian torsion and the  trulicity  Please do check your blood pressure and home and get in touch with me with some of your readings.  I refilled your tramadol for hand pain- use this at bedtime as needed but remember this is a narcotic- use it only when absolutely necessary!  I'll be in touch with your A1c and we can plan out next visit- likely will do in 3 months   Signed Lamar Blinks, MD  Results for orders placed or performed in visit on 07/13/15  Hemoglobin A1c  Result Value Ref  Range   Hgb A1c MFr Bld 7.5 (H) 4.6 - 6.5 %  Basic Metabolic Panel (BMET)  Result Value Ref Range   Sodium 138 135 - 145 mEq/L   Potassium 3.7 3.5 - 5.1 mEq/L   Chloride 106 96 - 112 mEq/L   CO2 27 19 - 32 mEq/L   Glucose, Bld 154 (H) 70 - 99 mg/dL   BUN 11 6 - 23 mg/dL   Creatinine, Ser 1.21 (H) 0.40 - 1.20 mg/dL   Calcium 9.0 8.4 - 10.5 mg/dL   GFR 61.35 >60.00 mL/min   Letter to pt

## 2015-07-13 NOTE — Progress Notes (Signed)
Pre visit review using our clinic review tool, if applicable. No additional management support is needed unless otherwise documented below in the visit note. 

## 2015-07-13 NOTE — Patient Instructions (Addendum)
I think you are fine to go back on Trulicity. As far as I know there is no connection between the ovarian torsion and the trulicity  Please do check your blood pressure and home and get in touch with me with some of your readings.  I refilled your tramadol for hand pain- use this at bedtime as needed but remember this is a narcotic- use it only when absolutely necessary!  I'll be in touch with your A1c and we can plan out next visit- likely will do in 3 months

## 2015-07-14 ENCOUNTER — Encounter: Payer: Self-pay | Admitting: Family Medicine

## 2015-07-20 NOTE — Telephone Encounter (Signed)
ERROR

## 2015-08-13 ENCOUNTER — Emergency Department (HOSPITAL_BASED_OUTPATIENT_CLINIC_OR_DEPARTMENT_OTHER)
Admission: EM | Admit: 2015-08-13 | Discharge: 2015-08-13 | Disposition: A | Discharge status: Home / Self Care | Payer: Managed Care, Other (non HMO) | Attending: Emergency Medicine | Admitting: Emergency Medicine

## 2015-08-13 ENCOUNTER — Emergency Department (HOSPITAL_BASED_OUTPATIENT_CLINIC_OR_DEPARTMENT_OTHER): Payer: Managed Care, Other (non HMO)

## 2015-08-13 ENCOUNTER — Encounter (HOSPITAL_BASED_OUTPATIENT_CLINIC_OR_DEPARTMENT_OTHER): Payer: Self-pay | Admitting: *Deleted

## 2015-08-13 DIAGNOSIS — K5900 Constipation, unspecified: Secondary | ICD-10-CM

## 2015-08-13 DIAGNOSIS — I10 Essential (primary) hypertension: Secondary | ICD-10-CM | POA: Insufficient documentation

## 2015-08-13 DIAGNOSIS — F17210 Nicotine dependence, cigarettes, uncomplicated: Secondary | ICD-10-CM | POA: Diagnosis not present

## 2015-08-13 DIAGNOSIS — R3915 Urgency of urination: Secondary | ICD-10-CM | POA: Diagnosis not present

## 2015-08-13 DIAGNOSIS — Z7984 Long term (current) use of oral hypoglycemic drugs: Secondary | ICD-10-CM | POA: Insufficient documentation

## 2015-08-13 DIAGNOSIS — R1032 Left lower quadrant pain: Secondary | ICD-10-CM | POA: Diagnosis present

## 2015-08-13 DIAGNOSIS — R252 Cramp and spasm: Secondary | ICD-10-CM | POA: Diagnosis not present

## 2015-08-13 DIAGNOSIS — E119 Type 2 diabetes mellitus without complications: Secondary | ICD-10-CM | POA: Diagnosis not present

## 2015-08-13 DIAGNOSIS — J45909 Unspecified asthma, uncomplicated: Secondary | ICD-10-CM | POA: Diagnosis not present

## 2015-08-13 DIAGNOSIS — Z794 Long term (current) use of insulin: Secondary | ICD-10-CM | POA: Insufficient documentation

## 2015-08-13 DIAGNOSIS — R300 Dysuria: Secondary | ICD-10-CM | POA: Insufficient documentation

## 2015-08-13 DIAGNOSIS — R110 Nausea: Secondary | ICD-10-CM | POA: Diagnosis not present

## 2015-08-13 LAB — LIPASE, BLOOD: LIPASE: 44 U/L (ref 11–51)

## 2015-08-13 LAB — URINALYSIS, ROUTINE W REFLEX MICROSCOPIC
Bilirubin Urine: NEGATIVE
GLUCOSE, UA: NEGATIVE mg/dL
HGB URINE DIPSTICK: NEGATIVE
Ketones, ur: NEGATIVE mg/dL
LEUKOCYTES UA: NEGATIVE
Nitrite: NEGATIVE
PH: 6 (ref 5.0–8.0)
PROTEIN: NEGATIVE mg/dL
Specific Gravity, Urine: 1.014 (ref 1.005–1.030)

## 2015-08-13 LAB — COMPREHENSIVE METABOLIC PANEL
ALT: 9 U/L — ABNORMAL LOW (ref 14–54)
AST: 19 U/L (ref 15–41)
Albumin: 3.4 g/dL — ABNORMAL LOW (ref 3.5–5.0)
Alkaline Phosphatase: 54 U/L (ref 38–126)
Anion gap: 11 (ref 5–15)
BUN: 13 mg/dL (ref 6–20)
CO2: 24 mmol/L (ref 22–32)
Calcium: 8.9 mg/dL (ref 8.9–10.3)
Chloride: 101 mmol/L (ref 101–111)
Creatinine, Ser: 1.32 mg/dL — ABNORMAL HIGH (ref 0.44–1.00)
GFR calc Af Amer: 55 mL/min — ABNORMAL LOW (ref >60)
GFR, EST NON AFRICAN AMERICAN: 47 mL/min — AB (ref >60)
Glucose, Bld: 143 mg/dL — ABNORMAL HIGH (ref 65–99)
POTASSIUM: 2.9 mmol/L — AB (ref 3.5–5.1)
Sodium: 136 mmol/L (ref 135–145)
TOTAL PROTEIN: 6.5 g/dL (ref 6.5–8.1)
Total Bilirubin: 0.5 mg/dL (ref 0.3–1.2)

## 2015-08-13 LAB — CBC WITH DIFFERENTIAL/PLATELET
BASOS ABS: 0 10*3/uL (ref 0.0–0.1)
Basophils Relative: 0 %
Eosinophils Absolute: 0.3 10*3/uL (ref 0.0–0.7)
Eosinophils Relative: 3 %
HEMATOCRIT: 35.4 % — AB (ref 36.0–46.0)
Hemoglobin: 12.1 g/dL (ref 12.0–15.0)
LYMPHS ABS: 3.9 10*3/uL (ref 0.7–4.0)
LYMPHS PCT: 40 %
MCH: 28.3 pg (ref 26.0–34.0)
MCHC: 34.2 g/dL (ref 30.0–36.0)
MCV: 82.9 fL (ref 78.0–100.0)
MONO ABS: 0.6 10*3/uL (ref 0.1–1.0)
MONOS PCT: 7 %
NEUTROS ABS: 4.8 10*3/uL (ref 1.7–7.7)
Neutrophils Relative %: 50 %
Platelets: 346 10*3/uL (ref 150–400)
RBC: 4.27 MIL/uL (ref 3.87–5.11)
RDW: 13.1 % (ref 11.5–15.5)
WBC: 9.7 10*3/uL (ref 4.0–10.5)

## 2015-08-13 LAB — CK: Total CK: 81 U/L (ref 38–234)

## 2015-08-13 LAB — PREGNANCY, URINE: Preg Test, Ur: NEGATIVE

## 2015-08-13 MED ORDER — POLYETHYLENE GLYCOL 3350 17 G PO PACK: 17.0000 g | PACK | Freq: Two times a day (BID) | ORAL | 0 refills | Status: DC

## 2015-08-13 MED ORDER — POTASSIUM CHLORIDE ER 10 MEQ PO TBCR: 10.0000 meq | EXTENDED_RELEASE_TABLET | Freq: Every day | ORAL | 0 refills | Status: DC

## 2015-08-13 MED ORDER — POTASSIUM CHLORIDE CRYS ER 20 MEQ PO TBCR
40.0000 meq | EXTENDED_RELEASE_TABLET | Freq: Once | ORAL | Status: AC
  Administered 2015-08-13: 40 meq via ORAL
  Filled 2015-08-13: qty 2

## 2015-08-13 NOTE — Discharge Instructions (Signed)
Please call your primary care doctor tomorrow to schedule a follow up appointment. Please take Miramax twice daily for constipation for the next few days. You may decrease to once daily once symptoms improve. Please take the potassium tablets once daily for the next week and follow up with your primary care doctor to recheck your potassium levels.

## 2015-08-13 NOTE — ED Provider Notes (Signed)
[facility] DEPT [facility] Provider Note   CSN: [id number] Arrival date & time: 08/13/15  [id number]  First Provider Contact:  First MD Initiated Contact with Patient 08/13/15 1917        History   Chief Complaint Chief Complaint  Patient presents with  . Leg Pain    HPI Renee James is a 45 y.o. female.  Patient presents today with complaints of LLQ abdominal pain, dysuria, and diffuse myalgias. Patient says she was in her usual state of health until  2 days ago when she began to experience abdominal pain. She says she has been constipated and her last normal BM was almost 1 week ago. She tried a rectal suppository at home a couple nights ago however only produced two small, hardened stools. In addition she reports symptoms of dysuria, urgency, and L sided flank pain. She also describes diffuse muscle pains in her bilateral thighs, arms, and back with weakness and difficulty walking up stairs. She says she was recently restarted on dulaglutide injections by her PCP for her diabetes. Patients says she was previously on this medications but stopped a few months ago after an emergency surgery for ovarian torsion. She began the injections again 4 weeks ago, missed one week, and had her most recent injection on Monday. Husband is suspicious of this medication as all of her symptoms have started after her most recent injection.       Past Medical History:  Diagnosis Date  . Allergy   . Anxiety   . Arthritis   . Asthma   . Bell's palsy   . Depression   . Diabetes mellitus without complication ([place])   . Hyperlipidemia   . Hypertension   . Pneumonia   . Renal insufficiency 05/13/2015    Patient Active Problem List   Diagnosis Date Noted  . Renal insufficiency 05/13/2015  . Ovarian torsion 04/28/2015  . Pain   . Hyperlipidemia 03/19/2015  . Obesity, unspecified 03/19/2015  . Controlled type 2 diabetes mellitus without complication, without long-term current use of insulin ([place])  03/19/2015  . Essential hypertension 03/19/2015  . GAD (generalized anxiety disorder) 03/19/2015    Past Surgical History:  Procedure Laterality Date  . ECTOPIC PREGNANCY SURGERY    . LAPAROSCOPY N/A 04/28/2015   Procedure: LAPAROSCOPY DIAGNOSTIC;  Surgeon: Donnamae Jude, MD;  Location: [facility];  Service: Gynecology;  Laterality: N/A;  . OVARIAN CYST REMOVAL    . TUBAL LIGATION      OB History    Gravida Para Term Preterm AB Living   5       2 3    SAB TAB Ectopic Multiple Live Births     1 1           Home Medications    Prior to Admission medications   Medication Sig Start Date End Date Taking? Authorizing Provider  citalopram (CELEXA) 40 MG tablet Take 1 tablet (40 mg total) by mouth daily. 03/19/15   Gay Filler Copland, MD  Dulaglutide (TRULICITY) A999333 0000000 SOPN Inject 0.75 mg into the skin once a week. 04/01/15   Gay Filler Copland, MD  hydrochlorothiazide (HYDRODIURIL) 25 MG tablet Take 25 mg by mouth daily.    Historical Provider, MD  losartan (COZAAR) 100 MG tablet Take 1 tablet (100 mg total) by mouth daily. 03/19/15   Gay Filler Copland, MD  metFORMIN (GLUCOPHAGE-XR) 500 MG 24 hr tablet Take 4 tablets by mouth daily. 09/16/14   Historical Provider, MD  polyethylene glycol (MIRALAX) packet Take  17 g by mouth 2 (two) times daily. 08/13/15   Velna Ochs, MD  potassium chloride (K-DUR) 10 MEQ tablet Take 1 tablet (10 mEq total) by mouth daily. 08/13/15   Velna Ochs, MD  traMADol (ULTRAM) 50 MG tablet Take 1 tablet (50 mg total) by mouth at bedtime as needed. 07/13/15   Darreld Mclean, MD    Family History Family History  Problem Relation Age of Onset  . Diabetes Mother   . Heart disease Mother   . Kidney failure Mother   . Stroke Father   . Kidney failure Father   . Breast cancer      Social History Social History  Substance Use Topics  . Smoking status: Current Every Day Smoker    Packs/day: 0.50    Types: Cigarettes  . Smokeless tobacco: Never Used    . Alcohol use Yes     Allergies   Latex; Ace inhibitors; and Other   Review of Systems Review of Systems  Constitutional: Negative.  Negative for fever.  HENT: Negative.   Eyes: Negative.   Respiratory: Negative.   Cardiovascular: Negative.   Gastrointestinal: Positive for abdominal pain, constipation and nausea. Negative for vomiting.  Endocrine: Negative.   Genitourinary: Positive for difficulty urinating, dysuria, flank pain and urgency.  Musculoskeletal: Positive for myalgias.  Skin: Negative.   Allergic/Immunologic: Negative.   Neurological: Negative.   Hematological: Negative.   Psychiatric/Behavioral: Negative.      Physical Exam Updated Vital Signs BP (!) 156/108 (BP Location: Right Arm)   Pulse (!) 58   Temp 98.4 F (36.9 C) (Oral)   Resp 18   LMP 08/06/2015   SpO2 100%   Physical Exam  Constitutional: She is oriented to person, place, and time. She appears well-developed and well-nourished. No distress.  HENT:  Head: Normocephalic and atraumatic.  Eyes: Conjunctivae are normal.  Neck: Neck supple.  Cardiovascular: Normal rate and regular rhythm.   No murmur heard. Pulmonary/Chest: Effort normal and breath sounds normal. No respiratory distress.  Abdominal: Soft. There is no tenderness.  Mildly tender to palpation LLQ  Musculoskeletal: She exhibits no edema.  Muscles diffusely tender to palpation - bilateral thighs, arms, and back.   Neurological: She is alert and oriented to person, place, and time.  Skin: Skin is warm and dry.  Psychiatric: She has a normal mood and affect.  Nursing note and vitals reviewed.    ED Treatments / Results  Labs (all labs ordered are listed, but only abnormal results are displayed) Labs Reviewed  CBC WITH DIFFERENTIAL/PLATELET - Abnormal; Notable for the following:       Result Value   HCT 35.4 (*)    All other components within normal limits  COMPREHENSIVE METABOLIC PANEL - Abnormal; Notable for the following:     Potassium 2.9 (*)    Glucose, Bld 143 (*)    Creatinine, Ser 1.32 (*)    Albumin 3.4 (*)    ALT 9 (*)    GFR calc non Af Amer 47 (*)    GFR calc Af Amer 55 (*)    All other components within normal limits  LIPASE, BLOOD  URINALYSIS, ROUTINE W REFLEX MICROSCOPIC (NOT AT [facility])  CK  PREGNANCY, URINE    EKG  EKG Interpretation None       Radiology Dg Abdomen 1 View  Result Date: 08/13/2015 CLINICAL DATA:  45 year old female with abdominal pain for 2 days. EXAM: ABDOMEN - 1 VIEW COMPARISON:  01/09/2012 FINDINGS: The bowel gas pattern  is normal. No radio-opaque calculi or other significant radiographic abnormality are seen. IMPRESSION: Negative. Electronically Signed   By: Margarette Canada M.D.   On: 08/13/2015 20:29    Procedures Procedures (including critical care time)  Medications Ordered in ED Medications  potassium chloride SA (K-DUR,KLOR-CON) CR tablet 40 mEq (40 mEq Oral Given 08/13/15 2115)     Initial Impression / Assessment and Plan / ED Course  I have reviewed the triage vital signs and the nursing notes.  Pertinent labs & imaging results that were available during my care of the patient were reviewed by me and considered in my medical decision making (see chart for details).  Clinical Course  Value Comment By Time  DG Abdomen 1 View (Reviewed) Velna Ochs, MD 08/10 2007   Abdominal pain: Last normal BM 1 week ago. KUB without significant stool burden. Likely a side effect from her trulicity injections as this is a documented side effect in up to 10% of patients on this medicine. Encouraged patient to discuss alternative treatment options with her PCP.   Dysuria and flank pain: UA negative, no signs of infection at this time. Follow up with PCP.  Diffuse myalgias: CK within normal limits. Etiology unclear. Possibly another side effect from her trulicity injections. Encourage patient to discuss with PCP.   Hypokalemia: Given 40 mEq potassium chloride in the  ED and sent home with a prescription for 10 mEq daily for 1 week. Instructed to follow up with PCP next week to recheck her potassium levels.   Final Clinical Impressions(s) / ED Diagnoses   Final diagnoses:  Constipation, unspecified constipation type  Muscle cramps    New Prescriptions Discharge Medication List as of 08/13/2015  9:23 PM    START taking these medications   Details  polyethylene glycol (MIRALAX) packet Take 17 g by mouth 2 (two) times daily., Starting Thu 08/13/2015, Print    potassium chloride (K-DUR) 10 MEQ tablet Take 1 tablet (10 mEq total) by mouth daily., Starting Thu 08/13/2015, Print         Velna Ochs, MD 08/14/15 [id number]    Leonard Schwartz, MD 08/14/15 1623

## 2015-08-13 NOTE — ED Notes (Signed)
Patient given socks to go to the restroom. Patient ambulatory to the restroom

## 2015-08-13 NOTE — ED Notes (Signed)
Patient transported to X-ray 

## 2015-08-13 NOTE — ED Triage Notes (Signed)
Pain in her legs, left hip and abdominal pain. She feels constipated.

## 2015-09-10 ENCOUNTER — Emergency Department (HOSPITAL_BASED_OUTPATIENT_CLINIC_OR_DEPARTMENT_OTHER): Payer: Managed Care, Other (non HMO)

## 2015-09-10 ENCOUNTER — Emergency Department (HOSPITAL_BASED_OUTPATIENT_CLINIC_OR_DEPARTMENT_OTHER)
Admission: EM | Admit: 2015-09-10 | Discharge: 2015-09-10 | Disposition: A | Discharge status: Home / Self Care | Payer: Managed Care, Other (non HMO) | Attending: Emergency Medicine | Admitting: Emergency Medicine

## 2015-09-10 ENCOUNTER — Encounter (HOSPITAL_BASED_OUTPATIENT_CLINIC_OR_DEPARTMENT_OTHER): Payer: Self-pay | Admitting: *Deleted

## 2015-09-10 ENCOUNTER — Telehealth: Payer: Self-pay | Admitting: Family Medicine

## 2015-09-10 DIAGNOSIS — Z91040 Latex allergy status: Secondary | ICD-10-CM | POA: Insufficient documentation

## 2015-09-10 DIAGNOSIS — J209 Acute bronchitis, unspecified: Secondary | ICD-10-CM | POA: Diagnosis not present

## 2015-09-10 DIAGNOSIS — E119 Type 2 diabetes mellitus without complications: Secondary | ICD-10-CM | POA: Diagnosis not present

## 2015-09-10 DIAGNOSIS — Z7984 Long term (current) use of oral hypoglycemic drugs: Secondary | ICD-10-CM | POA: Diagnosis not present

## 2015-09-10 DIAGNOSIS — I10 Essential (primary) hypertension: Secondary | ICD-10-CM | POA: Diagnosis not present

## 2015-09-10 DIAGNOSIS — R05 Cough: Secondary | ICD-10-CM | POA: Diagnosis present

## 2015-09-10 DIAGNOSIS — F17210 Nicotine dependence, cigarettes, uncomplicated: Secondary | ICD-10-CM | POA: Insufficient documentation

## 2015-09-10 DIAGNOSIS — Z720 Tobacco use: Secondary | ICD-10-CM

## 2015-09-10 DIAGNOSIS — J45909 Unspecified asthma, uncomplicated: Secondary | ICD-10-CM | POA: Insufficient documentation

## 2015-09-10 MED ORDER — ALBUTEROL SULFATE HFA 108 (90 BASE) MCG/ACT IN AERS
2.0000 | INHALATION_SPRAY | RESPIRATORY_TRACT | Status: DC | PRN
  Filled 2015-09-10: qty 6.7

## 2015-09-10 MED ORDER — PREDNISONE 10 MG PO TABS: 20.0000 mg | ORAL_TABLET | Freq: Two times a day (BID) | ORAL | 0 refills | Status: DC

## 2015-09-10 MED ORDER — ALBUTEROL SULFATE (2.5 MG/3ML) 0.083% IN NEBU
5.0000 mg | INHALATION_SOLUTION | Freq: Once | RESPIRATORY_TRACT | Status: AC
  Administered 2015-09-10: 5 mg via RESPIRATORY_TRACT
  Filled 2015-09-10: qty 6

## 2015-09-10 MED ORDER — AZITHROMYCIN 250 MG PO TABS: 250.0000 mg | ORAL_TABLET | Freq: Every day | ORAL | 0 refills | Status: DC

## 2015-09-10 MED ORDER — ALBUTEROL SULFATE HFA 108 (90 BASE) MCG/ACT IN AERS
2.0000 | INHALATION_SPRAY | RESPIRATORY_TRACT | Status: DC | PRN
  Administered 2015-09-10: 2 via RESPIRATORY_TRACT

## 2015-09-10 MED ORDER — ALBUTEROL SULFATE HFA 108 (90 BASE) MCG/ACT IN AERS: 2.0000 | INHALATION_SPRAY | RESPIRATORY_TRACT | Status: DC | PRN

## 2015-09-10 MED ORDER — IPRATROPIUM BROMIDE 0.02 % IN SOLN
0.5000 mg | Freq: Once | RESPIRATORY_TRACT | Status: AC
  Administered 2015-09-10: 0.5 mg via RESPIRATORY_TRACT
  Filled 2015-09-10: qty 2.5

## 2015-09-10 MED FILL — predniSONE 10 MG TABS: 10 | 5 days supply | Qty: 20 | Fill #0

## 2015-09-10 MED FILL — AZITHROMYCIN 250 MG TABLET: 250 | 5 days supply | Qty: 6 | Fill #0

## 2015-09-10 NOTE — ED Provider Notes (Signed)
[facility] Provider Note   CSN: [id number] Arrival date & time: 09/10/15  [id number]     History   Chief Complaint Chief Complaint  Patient presents with  . Cough    HPI Renee James is a 45 y.o. female.  Patient is a 45 year old female with history of hypertension, type 2 diabetes who presents with complaints of productive cough, wheezing, and shortness of breath which has worsened over the past week. She denies any fevers or chills. She reports coughing up a "dark sputum".   The history is provided by the patient.  Cough  This is a new problem. Episode onset: 1 week ago. The problem occurs constantly. The problem has been gradually worsening. The cough is productive of sputum. There has been no fever. Associated symptoms include shortness of breath and wheezing. Pertinent negatives include no chest pain and no chills. She has tried nothing for the symptoms. She is a smoker.    Past Medical History:  Diagnosis Date  . Allergy   . Anxiety   . Arthritis   . Asthma   . Bell's palsy   . Depression   . Diabetes mellitus without complication ([place])   . Hyperlipidemia   . Hypertension   . Pneumonia   . Renal insufficiency 05/13/2015    Patient Active Problem List   Diagnosis Date Noted  . Renal insufficiency 05/13/2015  . Ovarian torsion 04/28/2015  . Pain   . Hyperlipidemia 03/19/2015  . Obesity, unspecified 03/19/2015  . Controlled type 2 diabetes mellitus without complication, without long-term current use of insulin ([place]) 03/19/2015  . Essential hypertension 03/19/2015  . GAD (generalized anxiety disorder) 03/19/2015    Past Surgical History:  Procedure Laterality Date  . ECTOPIC PREGNANCY SURGERY    . LAPAROSCOPY N/A 04/28/2015   Procedure: LAPAROSCOPY DIAGNOSTIC;  Surgeon: Donnamae Jude, MD;  Location: [facility];  Service: Gynecology;  Laterality: N/A;  . OVARIAN CYST REMOVAL    . TUBAL LIGATION      OB History    Gravida Para Term Preterm AB Living   5        2 3    SAB TAB Ectopic Multiple Live Births     1 1           Home Medications    Prior to Admission medications   Medication Sig Start Date End Date Taking? Authorizing Provider  citalopram (CELEXA) 40 MG tablet Take 1 tablet (40 mg total) by mouth daily. 03/19/15   Gay Filler Copland, MD  Dulaglutide (TRULICITY) A999333 0000000 SOPN Inject 0.75 mg into the skin once a week. 04/01/15   Gay Filler Copland, MD  hydrochlorothiazide (HYDRODIURIL) 25 MG tablet Take 25 mg by mouth daily.    Historical Provider, MD  losartan (COZAAR) 100 MG tablet Take 1 tablet (100 mg total) by mouth daily. 03/19/15   Gay Filler Copland, MD  metFORMIN (GLUCOPHAGE-XR) 500 MG 24 hr tablet Take 4 tablets by mouth daily. 09/16/14   Historical Provider, MD  polyethylene glycol (MIRALAX) packet Take 17 g by mouth 2 (two) times daily. 08/13/15   Velna Ochs, MD  potassium chloride (K-DUR) 10 MEQ tablet Take 1 tablet (10 mEq total) by mouth daily. 08/13/15   Velna Ochs, MD  traMADol (ULTRAM) 50 MG tablet Take 1 tablet (50 mg total) by mouth at bedtime as needed. 07/13/15   Darreld Mclean, MD    Family History Family History  Problem Relation Age of Onset  . Diabetes Mother   .  Heart disease Mother   . Kidney failure Mother   . Stroke Father   . Kidney failure Father   . Breast cancer      Social History Social History  Substance Use Topics  . Smoking status: Current Every Day Smoker    Packs/day: 0.50    Types: Cigarettes  . Smokeless tobacco: Never Used  . Alcohol use Yes     Allergies   Latex; Ace inhibitors; and Other   Review of Systems Review of Systems  Constitutional: Negative for chills.  Respiratory: Positive for cough, shortness of breath and wheezing.   Cardiovascular: Negative for chest pain.  All other systems reviewed and are negative.    Physical Exam Updated Vital Signs BP 155/89 (BP Location: Left Arm)   Pulse 90   Temp 98.9 F (37.2 C) (Oral)   Resp 20   Ht  5\' 3"  (1.6 m)   Wt 180 lb (81.6 kg)   LMP 08/06/2015   SpO2 99%   BMI 31.89 kg/m   Physical Exam  Constitutional: She is oriented to person, place, and time. She appears well-developed and well-nourished. No distress.  HENT:  Head: Normocephalic and atraumatic.  Neck: Normal range of motion. Neck supple.  Cardiovascular: Normal rate and regular rhythm.  Exam reveals no gallop and no friction rub.   No murmur heard. Pulmonary/Chest: Effort normal. No respiratory distress. She has wheezes. She has no rales.  There are bilateral wheezes present.  Abdominal: Soft. Bowel sounds are normal. She exhibits no distension. There is no tenderness.  Musculoskeletal: Normal range of motion.  Neurological: She is alert and oriented to person, place, and time.  Skin: Skin is warm and dry. She is not diaphoretic.  Nursing note and vitals reviewed.    ED Treatments / Results  Labs (all labs ordered are listed, but only abnormal results are displayed) Labs Reviewed - No data to display  EKG  EKG Interpretation None       Radiology No results found.  Procedures Procedures (including critical care time)  Medications Ordered in ED Medications  albuterol (PROVENTIL) (2.5 MG/3ML) 0.083% nebulizer solution 5 mg (5 mg Nebulization Given 09/10/15 0646)  ipratropium (ATROVENT) nebulizer solution 0.5 mg (0.5 mg Nebulization Given 09/10/15 0646)     Initial Impression / Assessment and Plan / ED Course  I have reviewed the triage vital signs and the nursing notes.  Pertinent labs & imaging results that were available during my care of the patient were reviewed by me and considered in my medical decision making (see chart for details).  Clinical Course    Patient feeling better and wheezing has improved after a nebulizer treatment. Her chest x-ray is clear, however I do suspect possibly some underlying COPD. She will be treated with Zithromax, prednisone, and an albuterol MDI. To return as  needed for any problems. She has also been advised to discuss smoking cessation techniques with her primary doctor.  Final Clinical Impressions(s) / ED Diagnoses   Final diagnoses:  None    New Prescriptions New Prescriptions   No medications on file     Veryl Speak, MD 09/10/15 [phone]

## 2015-09-10 NOTE — Telephone Encounter (Signed)
Called her back- had to [facility]. Looking over her ER notes it looks like she is already on good treatment for her illness and I would not add anything else at this time. Let me know if not feeling better soon

## 2015-09-10 NOTE — Discharge Instructions (Signed)
Zithromax and prednisone as prescribed.  Albuterol inhaler: 2 puffs every 4 hours as needed for wheezing.  Follow up with your primary Dr. in the next week to discuss smoking cessation techniques.  Return to the ER if your symptoms significant only worsen or change.

## 2015-09-10 NOTE — Telephone Encounter (Signed)
Pt called in. She says that she was seen in the emergency room and Dx for acute bronchitis. Pt says that she would like to know if she could be prescribed medication Promethazine with codeine w/o DC? She says that her mom had the same concern one and medication helped her.   Please advise further.     Thanks.    CB: [phone]

## 2015-09-10 NOTE — ED Triage Notes (Addendum)
Productive cough, greenish yellow  Onset Sunday states woke this am w chills and wheezing generalized body aches

## 2015-09-30 ENCOUNTER — Other Ambulatory Visit: Payer: Self-pay | Admitting: Emergency Medicine

## 2015-09-30 MED ORDER — HYDROCHLOROTHIAZIDE 25 MG PO TABS: 25.0000 mg | ORAL_TABLET | Freq: Every day | ORAL | 3 refills | Status: DC

## 2015-10-12 ENCOUNTER — Emergency Department (HOSPITAL_BASED_OUTPATIENT_CLINIC_OR_DEPARTMENT_OTHER)
Admission: EM | Admit: 2015-10-12 | Discharge: 2015-10-13 | Disposition: A | Discharge status: Home / Self Care | Payer: Managed Care, Other (non HMO) | Attending: Emergency Medicine | Admitting: Emergency Medicine

## 2015-10-12 ENCOUNTER — Encounter (HOSPITAL_BASED_OUTPATIENT_CLINIC_OR_DEPARTMENT_OTHER): Payer: Self-pay | Admitting: *Deleted

## 2015-10-12 DIAGNOSIS — Z79899 Other long term (current) drug therapy: Secondary | ICD-10-CM | POA: Insufficient documentation

## 2015-10-12 DIAGNOSIS — Z791 Long term (current) use of non-steroidal anti-inflammatories (NSAID): Secondary | ICD-10-CM | POA: Diagnosis not present

## 2015-10-12 DIAGNOSIS — J45909 Unspecified asthma, uncomplicated: Secondary | ICD-10-CM | POA: Diagnosis not present

## 2015-10-12 DIAGNOSIS — M549 Dorsalgia, unspecified: Secondary | ICD-10-CM | POA: Insufficient documentation

## 2015-10-12 DIAGNOSIS — M62838 Other muscle spasm: Secondary | ICD-10-CM | POA: Diagnosis not present

## 2015-10-12 DIAGNOSIS — G44209 Tension-type headache, unspecified, not intractable: Secondary | ICD-10-CM | POA: Diagnosis not present

## 2015-10-12 DIAGNOSIS — M542 Cervicalgia: Secondary | ICD-10-CM | POA: Insufficient documentation

## 2015-10-12 DIAGNOSIS — E119 Type 2 diabetes mellitus without complications: Secondary | ICD-10-CM | POA: Insufficient documentation

## 2015-10-12 DIAGNOSIS — F17210 Nicotine dependence, cigarettes, uncomplicated: Secondary | ICD-10-CM | POA: Diagnosis not present

## 2015-10-12 DIAGNOSIS — Z7984 Long term (current) use of oral hypoglycemic drugs: Secondary | ICD-10-CM | POA: Insufficient documentation

## 2015-10-12 DIAGNOSIS — R51 Headache: Secondary | ICD-10-CM | POA: Diagnosis present

## 2015-10-12 DIAGNOSIS — I10 Essential (primary) hypertension: Secondary | ICD-10-CM | POA: Diagnosis not present

## 2015-10-12 MED ORDER — HYDROCODONE-ACETAMINOPHEN 5-325 MG PO TABS
2.0000 | ORAL_TABLET | Freq: Once | ORAL | Status: AC
  Administered 2015-10-12: 2 via ORAL
  Filled 2015-10-12: qty 2

## 2015-10-12 MED ORDER — CYCLOBENZAPRINE HCL 10 MG PO TABS: 10.0000 mg | ORAL_TABLET | Freq: Two times a day (BID) | ORAL | 0 refills | Status: DC | PRN

## 2015-10-12 MED ORDER — HYDROCODONE-ACETAMINOPHEN 5-325 MG PO TABS: 1.0000 | ORAL_TABLET | Freq: Four times a day (QID) | ORAL | 0 refills | Status: DC | PRN

## 2015-10-12 MED ORDER — DIAZEPAM 5 MG/ML IJ SOLN
5.0000 mg | Freq: Once | INTRAMUSCULAR | Status: AC
  Administered 2015-10-12: 5 mg via INTRAMUSCULAR
  Filled 2015-10-12: qty 2

## 2015-10-12 NOTE — ED Notes (Signed)
Pt c/o headache, neck pain and back pain that she took tramadol for this morning.  She has not tried any tylenol or ibuprofen

## 2015-10-12 NOTE — ED Triage Notes (Signed)
Headache x 2 days. The right side of her neck hurts if she moves a certain way per pt. Lower back pain yesterday with radiation down her right leg today. She is ambulatory to triage. Drove herself here.

## 2015-10-12 NOTE — ED Provider Notes (Signed)
[facility] Provider Note   CSN: [id number] Arrival date & time: 10/12/15  2200   By signing my name below, I, Macon Large, attest that this documentation has been prepared under the direction and in the presence of Montine Circle, PA-C. Electronically Signed: Macon Large, ED Scribe. 10/12/15. 11:02 PM.  History   Chief Complaint Chief Complaint  Patient presents with  . Headache   The history is provided by the patient. No language interpreter was used.    HPI Comments: Renee James is a 45 y.o. female who presents to the Emergency Department complaining of moderate, constant, lower back pain onset last night. She denies recent trauma, injury or falls. Pt also complains of occipital HA and neck pain onset this week. She notes her pain is worsened with neck rotation. Pt notes taking Excedrin for headache with minimal relief. Pt also states she took Tramadol for back pain with no significant relief. 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 Pt denies fever, bowel or bladder incontinence.    Past Medical History:  Diagnosis Date  . Allergy   . Anxiety   . Arthritis   . Asthma   . Bell's palsy   . Depression   . Diabetes mellitus without complication ([place])   . Hyperlipidemia   . Hypertension   . Pneumonia   . Renal insufficiency 05/13/2015    Patient Active Problem List   Diagnosis Date Noted  . Renal insufficiency 05/13/2015  . Ovarian torsion 04/28/2015  . Pain   . Hyperlipidemia 03/19/2015  . Obesity, unspecified 03/19/2015  . Controlled type 2 diabetes mellitus without complication, without long-term current use of insulin ([place]) 03/19/2015  . Essential hypertension 03/19/2015  . GAD (generalized anxiety disorder) 03/19/2015    Past Surgical History:  Procedure Laterality Date  . ECTOPIC PREGNANCY SURGERY    . LAPAROSCOPY N/A 04/28/2015   Procedure: LAPAROSCOPY DIAGNOSTIC;  Surgeon: Donnamae Jude, MD;  Location: [facility];  Service: Gynecology;   Laterality: N/A;  . OVARIAN CYST REMOVAL    . TUBAL LIGATION      OB History    Gravida Para Term Preterm AB Living   5       2 3    SAB TAB Ectopic Multiple Live Births     1 1           Home Medications    Prior to Admission medications   Medication Sig Start Date End Date Taking? Authorizing Provider  citalopram (CELEXA) 40 MG tablet Take 1 tablet (40 mg total) by mouth daily. 03/19/15  Yes Gay Filler Copland, MD  hydrochlorothiazide (HYDRODIURIL) 25 MG tablet Take 1 tablet (25 mg total) by mouth daily. 09/30/15  Yes Gay Filler Copland, MD  losartan (COZAAR) 100 MG tablet Take 1 tablet (100 mg total) by mouth daily. 03/19/15  Yes Gay Filler Copland, MD  metFORMIN (GLUCOPHAGE-XR) 500 MG 24 hr tablet Take 4 tablets by mouth daily. 09/16/14  Yes Historical Provider, MD  polyethylene glycol (MIRALAX) packet Take 17 g by mouth 2 (two) times daily. 08/13/15  Yes Velna Ochs, MD  traMADol (ULTRAM) 50 MG tablet Take 1 tablet (50 mg total) by mouth at bedtime as needed. 07/13/15  Yes Gay Filler Copland, MD  azithromycin (ZITHROMAX) 250 MG tablet Take 1 tablet (250 mg total) by mouth daily. Take first 2 tablets together, then 1 every day until finished. 09/10/15   Veryl Speak, MD  Dulaglutide (TRULICITY) A999333 0000000 SOPN Inject 0.75 mg into the skin once a week.  04/01/15   Gay Filler Copland, MD  potassium chloride (K-DUR) 10 MEQ tablet Take 1 tablet (10 mEq total) by mouth daily. 08/13/15   Velna Ochs, MD  predniSONE (DELTASONE) 10 MG tablet Take 2 tablets (20 mg total) by mouth 2 (two) times daily. 09/10/15   Veryl Speak, MD    Family History Family History  Problem Relation Age of Onset  . Diabetes Mother   . Heart disease Mother   . Kidney failure Mother   . Stroke Father   . Kidney failure Father   . Breast cancer      Social History Social History  Substance Use Topics  . Smoking status: Current Every Day Smoker    Packs/day: 0.50    Types: Cigarettes  . Smokeless tobacco:  Never Used  . Alcohol use Yes     Allergies   Latex; Ace inhibitors; and Other   Review of Systems Review of Systems  Constitutional: Negative for fever.  Gastrointestinal:       -bowel or bladder incontinence  Musculoskeletal: Positive for back pain and neck pain.  Neurological: Positive for headaches.  All other systems reviewed and are negative.    Physical Exam Updated Vital Signs BP 159/91   Pulse 69   Temp 98.6 F (37 C) (Oral)   Resp 18   Ht 5\' 3"  (1.6 m)   Wt 180 lb (81.6 kg)   LMP 10/09/2015   SpO2 99%   BMI 31.89 kg/m   Physical Exam Physical Exam  Constitutional: Pt appears well-developed and well-nourished. No distress.  HENT:  Head: Normocephalic and atraumatic.  Mouth/Throat: Oropharynx is clear and moist. No oropharyngeal exudate.  Eyes: Conjunctivae are normal.  Neck: Normal range of motion. Neck supple.  No meningismus Cardiovascular: Normal rate, regular rhythm and intact distal pulses.   Pulmonary/Chest: Effort normal and breath sounds normal. No respiratory distress. Pt has no wheezes.  Abdominal: Pt exhibits no distension Musculoskeletal:  Right upper trapezius and right cervical paraspinal muscles tender to palpation, no bony CTLS spine tenderness, deformity, step-off, or crepitus Lymphadenopathy: Pt has no cervical adenopathy.  Neurological: Pt is alert and oriented Speech is clear and goal oriented, follows commands Normal 5/5 strength in upper and lower extremities bilaterally including dorsiflexion and plantar flexion, strong and equal grip strength Sensation intact Great toe extension intact Moves extremities without ataxia, coordination intact Normal gait Normal balance No Clonus Skin: Skin is warm and dry. No rash noted. Pt is not diaphoretic. No erythema.  Psychiatric: Pt has a normal mood and affect. Behavior is normal.  Nursing note and vitals reviewed.   ED Treatments / Results   DIAGNOSTIC STUDIES: Oxygen Saturation  is 99% on RA, normal by my interpretation.    COORDINATION OF CARE: 10:48 PM Discussed treatment plan with pt at bedside which includes pain medication and muscle relaxer and pt agreed to plan.  Labs (all labs ordered are listed, but only abnormal results are displayed) Labs Reviewed - No data to display  EKG  EKG Interpretation None       Radiology No results found.  Procedures Procedures (including critical care time)  Medications Ordered in ED Medications  diazepam (VALIUM) injection 5 mg (not administered)  HYDROcodone-acetaminophen (NORCO/VICODIN) 5-325 MG per tablet 2 tablet (not administered)     Initial Impression / Assessment and Plan / ED Course  I have reviewed the triage vital signs and the nursing notes.  Pertinent labs & imaging results that were available during my care of the  patient were reviewed by me and considered in my medical decision making (see chart for details).  Clinical Course   Patient with back pain.  Seems to be all muscle spasm.  No neurological deficits and normal neuro exam.  Patient is ambulatory.  No loss of bowel or bladder control.  Doubt cauda equina.  Denies fever,  doubt epidural abscess or other lesion. Recommend back exercises, stretching, RICE, and will treat with a short course of flexeril and norco.    Final Clinical Impressions(s) / ED Diagnoses   Final diagnoses:  Tension headache  Muscle spasm    New Prescriptions New Prescriptions   CYCLOBENZAPRINE (FLEXERIL) 10 MG TABLET    Take 1 tablet (10 mg total) by mouth 2 (two) times daily as needed for muscle spasms.   HYDROCODONE-ACETAMINOPHEN (NORCO/VICODIN) 5-325 MG TABLET    Take 1 tablet by mouth every 6 (six) hours as needed.   I personally performed the services described in this documentation, which was scribed in my presence. The recorded information has been reviewed and is accurate.      Montine Circle, PA-C 10/13/15 0001    Veryl Speak, MD 10/13/15  [phone]

## 2015-10-13 NOTE — ED Notes (Signed)
Pt verbalizes understanding of d/c instructions and denies any further needs at this time. 

## 2015-11-16 ENCOUNTER — Inpatient Hospital Stay: Payer: Managed Care, Other (non HMO) | Admitting: Family Medicine

## 2015-12-11 ENCOUNTER — Telehealth: Payer: Self-pay | Admitting: Family Medicine

## 2015-12-11 NOTE — Telephone Encounter (Signed)
Ok sure

## 2015-12-11 NOTE — Telephone Encounter (Signed)
Patient cancelled 11/16/2015 hospital follow up due to death in the family, patient would like $50 no show fee waived

## 2015-12-14 ENCOUNTER — Ambulatory Visit (INDEPENDENT_AMBULATORY_CARE_PROVIDER_SITE_OTHER): Payer: Managed Care, Other (non HMO) | Admitting: Family Medicine

## 2015-12-14 ENCOUNTER — Encounter: Payer: Self-pay | Admitting: Family Medicine

## 2015-12-14 VITALS — BP 145/82 | HR 65 | Temp 98.5°F | Ht 63.0 in | Wt 193.0 lb

## 2015-12-14 DIAGNOSIS — Z23 Encounter for immunization: Secondary | ICD-10-CM | POA: Diagnosis not present

## 2015-12-14 DIAGNOSIS — F411 Generalized anxiety disorder: Secondary | ICD-10-CM | POA: Diagnosis not present

## 2015-12-14 DIAGNOSIS — I10 Essential (primary) hypertension: Secondary | ICD-10-CM

## 2015-12-14 DIAGNOSIS — IMO0001 Reserved for inherently not codable concepts without codable children: Secondary | ICD-10-CM

## 2015-12-14 DIAGNOSIS — F330 Major depressive disorder, recurrent, mild: Secondary | ICD-10-CM

## 2015-12-14 DIAGNOSIS — G8929 Other chronic pain: Secondary | ICD-10-CM

## 2015-12-14 DIAGNOSIS — M79643 Pain in unspecified hand: Secondary | ICD-10-CM | POA: Diagnosis not present

## 2015-12-14 DIAGNOSIS — E1165 Type 2 diabetes mellitus with hyperglycemia: Secondary | ICD-10-CM | POA: Diagnosis not present

## 2015-12-14 LAB — COMPREHENSIVE METABOLIC PANEL
ALT: 11 U/L (ref 0–35)
AST: 13 U/L (ref 0–37)
Albumin: 3.6 g/dL (ref 3.5–5.2)
Alkaline Phosphatase: 62 U/L (ref 39–117)
BILIRUBIN TOTAL: 0.3 mg/dL (ref 0.2–1.2)
BUN: 11 mg/dL (ref 6–23)
CHLORIDE: 105 meq/L (ref 96–112)
CO2: 25 meq/L (ref 19–32)
Calcium: 9.2 mg/dL (ref 8.4–10.5)
Creatinine, Ser: 1.2 mg/dL (ref 0.40–1.20)
GFR: 61.83 mL/min (ref >60.00)
GLUCOSE: 111 mg/dL — AB (ref 70–99)
Potassium: 4.2 mEq/L (ref 3.5–5.1)
Sodium: 138 mEq/L (ref 135–145)
Total Protein: 6.6 g/dL (ref 6.0–8.3)

## 2015-12-14 LAB — HEMOGLOBIN A1C: Hgb A1c MFr Bld: 8.1 % — ABNORMAL HIGH (ref 4.6–6.5)

## 2015-12-14 MED ORDER — TRAMADOL HCL 50 MG PO TABS: 50.0000 mg | ORAL_TABLET | Freq: Every evening | ORAL | 1 refills | Status: DC | PRN

## 2015-12-14 MED ORDER — HYDROCHLOROTHIAZIDE 25 MG PO TABS: 25.0000 mg | ORAL_TABLET | Freq: Every day | ORAL | 3 refills | Status: DC

## 2015-12-14 MED ORDER — BUPROPION HCL ER (SR) 150 MG PO TB12: 150.0000 mg | ORAL_TABLET | Freq: Two times a day (BID) | ORAL | 5 refills | Status: DC

## 2015-12-14 MED FILL — BUPROPION SR 150 MG TABLET: 150 | 30 days supply | Qty: 60 | Fill #0

## 2015-12-14 MED FILL — HYDROCHLOROTHIAZIDE 25 MG T: 25 | 90 days supply | Qty: 90 | Fill #0 | Status: TO

## 2015-12-14 MED FILL — traMADol HCL 50 MG TABS: 50 | 30 days supply | Qty: 30 | Fill #1

## 2015-12-14 NOTE — Progress Notes (Signed)
[facility] [address], [place], [place] [address] [id number] [phone]  Date:  12/14/2015   Name:  Renee James   DOB:  02-Mar-1970   MRN:  [id number]  PCP:  Lamar Blinks, MD    Chief Complaint: Hospitalization Follow-up (Pt here for hosp f/u. Last ER visit 10/12/2015. Would like to dicuss Citalopram, pt doesn't feel that the medication is working as well anymore. Would like A1C checked today. )   History of Present Illness:  Renee James is a 45 y.o. very pleasant female patient who presents with the following:  She was in the ER 2 months ago with lower back pain. Here today to discuss her diabetes mellitus, chronic renal insuf, hyperlipidemia, HTN, depression She is on losartan and HCTZ- ran out of her HCTZ a couple of days ago which is probably why her BP is up slightly today  She has DM- last A1c in July was 7.5% BP Readings from Last 3 Encounters:  12/14/15 (!) 145/82  10/12/15 159/91  09/10/15 140/70   She needs her A1c checked today, last cholesterol was about 9 months ago  She has been on celexa for about 12 years.  She is on 40 mg. She has felt like it was not working as well as it had in the past for "a few months."   She notes difficulty falling asleep at night, but she is tired during the day also Her energy level is low.  appetite is "pretty good."   No SI She feels more irritable and cries easily at small things.   She notes a lot of stressors- her job is unpleasant, her SIL who was a close friend passed away last month from liver cancer.  Also 2 of her children are currently in prison.   She feels like her muscle spasms have been worse recently. "whenever I get upset my neck will stiffen up."    Her menses are somewhat irregular at this time - early menopause runs in her family, she wonders if she is going through this change.  S/p BTL  She uses tramadol some as well for her back pains.   She notes that  the muscles in her neck and back are "hard all the time." She has not treid any other treatment such PT or massage therapy as of yet Declines a flu shot, but is willing to get a pneumonia shot today  She is on metformin 1,000 BID  Discussed options for treating her depression.  Instead of taking her off celexa completely we decided to add wellbutrin and monitor her closely  Patient Active Problem List   Diagnosis Date Noted  . Renal insufficiency 05/13/2015  . Ovarian torsion 04/28/2015  . Pain   . Hyperlipidemia 03/19/2015  . Obesity, unspecified 03/19/2015  . Controlled type 2 diabetes mellitus without complication, without long-term current use of insulin ([place]) 03/19/2015  . Essential hypertension 03/19/2015  . GAD (generalized anxiety disorder) 03/19/2015    Past Medical History:  Diagnosis Date  . Allergy   . Anxiety   . Arthritis   . Asthma   . Bell's palsy   . Depression   . Diabetes mellitus without complication ([place])   . Hyperlipidemia   . Hypertension   . Pneumonia   . Renal insufficiency 05/13/2015    Past Surgical History:  Procedure Laterality Date  . ECTOPIC PREGNANCY SURGERY    . LAPAROSCOPY N/A 04/28/2015   Procedure: LAPAROSCOPY DIAGNOSTIC;  Surgeon: Donnamae Jude, MD;  Location: [facility];  Service: Gynecology;  Laterality: N/A;  . OVARIAN CYST REMOVAL    . TUBAL LIGATION      Social History  Substance Use Topics  . Smoking status: Current Every Day Smoker    Packs/day: 0.50    Types: Cigarettes  . Smokeless tobacco: Never Used  . Alcohol use Yes    Family History  Problem Relation Age of Onset  . Diabetes Mother   . Heart disease Mother   . Kidney failure Mother   . Stroke Father   . Kidney failure Father   . Breast cancer      Allergies  Allergen Reactions  . Latex Hives, Shortness Of Breath and Itching  . Ace Inhibitors Cough  . 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  Current Outpatient Prescriptions on File Prior to Visit  Medication Sig Dispense Refill  . citalopram (CELEXA) 40 MG tablet Take 1 tablet (40 mg total) by mouth daily. 30 tablet 11  . Dulaglutide (TRULICITY) A999333 0000000 SOPN Inject 0.75 mg into the skin once a week. 4 pen 12  . hydrochlorothiazide (HYDRODIURIL) 25 MG tablet Take 1 tablet (25 mg total) by mouth daily. 30 tablet 3  . HYDROcodone-acetaminophen (NORCO/VICODIN) 5-325 MG tablet Take 1 tablet by mouth every 6 (six) hours as needed. 7 tablet 0  . losartan (COZAAR) 100 MG tablet Take 1 tablet (100 mg total) by mouth daily. 30 tablet 11  . metFORMIN (GLUCOPHAGE-XR) 500 MG 24 hr tablet Take 4 tablets by mouth daily.    . polyethylene glycol (MIRALAX) packet Take 17 g by mouth 2 (two) times daily. 30 each 0  . potassium chloride (K-DUR) 10 MEQ tablet Take 1 tablet (10 mEq total) by mouth daily. 7 tablet 0  . predniSONE (DELTASONE) 10 MG tablet Take 2 tablets (20 mg total) by mouth 2 (two) times daily. 20 tablet 0  . traMADol (ULTRAM) 50 MG tablet Take 1 tablet (50 mg total) by mouth at bedtime as needed. 30 tablet 1   No current facility-administered medications on file prior to visit.     Review of Systems:  As per HPI- otherwise negative. No fever, chills, nausea, vomiting, CP , SOB or rash noted   Physical Examination: Blood pressure (!) 145/82, pulse 65, temperature 98.5 F (36.9 C), temperature source Oral, height 5\' 3"  (1.6 m), weight 193 lb (87.5 kg), last menstrual period 11/25/2015, SpO2 100 %.  Vitals:   12/14/15 1157  Weight: 193 lb (87.5 kg)  Height: 5\' 3"  (1.6 m)   Body mass index is 34.19 kg/m. Ideal Body Weight: Weight in (lb) to have BMI = 25: 140.8  GEN: WDWN, NAD, Non-toxic, A & O x 3, obese, otherwise looks well HEENT: Atraumatic, Normocephalic. Neck supple. No masses, No LAD. Bilateral TM wnl, oropharynx normal.  PEERL,EOMI.   Ears and Nose: No external deformity. CV: RRR,  No M/G/R. No JVD. No thrill. No extra heart sounds. PULM: CTA B, no wheezes, crackles, rhonchi. No retractions. No resp. distress. No accessory muscle use. EXTR: No c/c/e NEURO Normal gait.  PSYCH: Normally interactive. Conversant. Not depressed or anxious appearing.  Calm demeanor.   Assessment and Plan: Uncontrolled type 2 diabetes mellitus without complication, without long-term current use of insulin ([place]) - Plan: Comprehensive metabolic panel, Hemoglobin A1c  Chronic hand pain, unspecified laterality - Plan: traMADol (  ULTRAM) 50 MG tablet  Essential hypertension - Plan: hydrochlorothiazide (HYDRODIURIL) 25 MG tablet  GAD (generalized anxiety disorder)  Mild episode of recurrent major depressive disorder ([facility]) - Plan: buPROPion (WELLBUTRIN SR) 150 MG 12 hr tablet  Immunization due - Plan: Pneumococcal polysaccharide vaccine 23-valent greater than or equal to 2yo subcutaneous/IM  Here today to catch up on her chronic disease management. She also notes worsening of her depression and anxiety over the last few months.  We discussed options and decided to add wellbutrin to her celexa.  Will decrease celexa to 20 mg at least to start She will see me in 6 weeks to check on her progress if not doing ok in the meantime, SI, etc- she is to seek help right away.  She agrees with this plan  Will plan further follow- up pending labs.   Signed Lamar Blinks, MD  Results for orders placed or performed in visit on 12/14/15  Comprehensive metabolic panel  Result Value Ref Range   Sodium 138 135 - 145 mEq/L   Potassium 4.2 3.5 - 5.1 mEq/L   Chloride 105 96 - 112 mEq/L   CO2 25 19 - 32 mEq/L   Glucose, Bld 111 (H) 70 - 99 mg/dL   BUN 11 6 - 23 mg/dL   Creatinine, Ser 1.20 0.40 - 1.20 mg/dL   Total Bilirubin 0.3 0.2 - 1.2 mg/dL   Alkaline Phosphatase 62 39 - 117 U/L   AST 13 0 - 37 U/L   ALT 11 0 - 35 U/L   Total Protein 6.6 6.0 - 8.3 g/dL   Albumin 3.6 3.5 - 5.2 g/dL   Calcium 9.2 8.4  - 10.5 mg/dL   GFR 61.83 >60.00 mL/min  Hemoglobin A1c  Result Value Ref Range   Hgb A1c MFr Bld 8.1 (H) 4.6 - 6.5 %   Her A1c is now over 8%.  Note to pt on mychart- please work on diet, exercise.  wellbutrin may also help with some weight loss.  Repeat in 3 months- if still over 8 will need to change her med regimen  Wt Readings from Last 3 Encounters:  12/14/15 193 lb (87.5 kg)  10/12/15 180 lb (81.6 kg)  09/10/15 180 lb (81.6 kg)

## 2015-12-14 NOTE — Patient Instructions (Addendum)
We are going to add wellbutrin to your celexa for depression and anxiety. Start with taking this once a day in the morning, and increase to twice a day after 3 days For the time being decrease your celexa to 20 mg (1/2 tablet)- we may go back up the next time we see you. This will help prevent side effects We will also check your labs today, and I refilled your other medications.    Please come and see me in about 6 weeks to check on how you are doing!   You got your pneumonia shot today

## 2015-12-14 NOTE — Progress Notes (Signed)
Pre visit review using our clinic review tool, if applicable. No additional management support is needed unless otherwise documented below in the visit note. 

## 2016-01-18 ENCOUNTER — Other Ambulatory Visit: Payer: Self-pay | Admitting: Emergency Medicine

## 2016-01-18 ENCOUNTER — Telehealth: Payer: Self-pay | Admitting: Family Medicine

## 2016-01-18 MED ORDER — METFORMIN HCL ER 500 MG PO TB24: 2000.0000 mg | ORAL_TABLET | Freq: Every day | ORAL | 3 refills | Status: DC

## 2016-01-18 MED FILL — METFORMIN HCL ER 500 MG TAB: 500 | 30 days supply | Qty: 120 | Fill #0

## 2016-01-18 NOTE — Telephone Encounter (Signed)
Caller name: Relationship to patient: Self Can be reached: [phone]  Pharmacy:  [place], [place] - [address] [phone]) [phone] (Fax)     Reason for call: Patient request refill on ALL medications be sent to pharmacy. States she has not had her Metformin in 5 days

## 2016-01-18 NOTE — Telephone Encounter (Signed)
The only medication that did not have refills was the Metformin. Called pharmacy to make sure pt has refills on everything else and pharmacy did confirm this. Refill sent in for Metformin.

## 2016-01-18 NOTE — Telephone Encounter (Signed)
Caller name: Relationship to patient:Self Can be reached:                     Demographics    Renee James 46 year old female 09/04/1970 Comm Pref:   [facility] [address]  [name] [id number] [phone] (M)   Advanced Directives    10/12/15 09/10/15 08/13/15  Does Patient Have a Medical Advance Directive? No No No  Would patient like information on creating a medical advance directive? -- No - patient declined information --   Chief Complaint   Medication Refill   Problem List  Collapse by Default  Collapse by Default       Hyperlipidemia  Obesity, unspecified  Controlled type 2 diabetes mellitus without complication, without long-term current use of insulin (HCC)  Essential hypertension  GAD (generalized anxiety disorder)  Ovarian torsion  Pain  Renal insufficiency  Expand to view 8 more items    Mark as Reviewed Medications (Reviewed by CMA on 12/14/2015)     Health Maintenance    07/26/1978 FOOT EXAM    07/26/1983 HIV Screening    07/25/1989 PAP SMEAR    04/02/2016 INFLUENZA VACCINE    02/04/2016 OPHTHALMOLOGY EXAM    06/13/2016 HEMOGLOBIN A1C    12/13/2020 PNEUMOCOCCAL POLYSACCHARIDE VACCINE (2)    07/03/2025 TETANUS/TDAP    Reminders and Results   None      Care Team and Communications   No referring provider set    PCPs Type  Darreld Mclean, MD General  No other patient care team members    Recipients of Past [facility] Encounter - 10/12/2015    Darreld Mclean, MD 10/13/2015 In [place], MD 10/13/2015 In [facility] Encounter - 09/10/2015    Darreld Mclean, MD 09/10/2015 In [facility] Encounter - 08/13/2015    Darreld Mclean, MD 08/13/2015 In Basket  Letter (Out) - 07/14/2015    Delila Pereyra 07/14/2015 Mail  Office Visit - 05/11/2015    Darreld Mclean, MD 05/12/2015 In [facility] Encounter - 04/28/2015    Darreld Mclean, MD 04/28/2015 In Basket  Telephone - 04/01/2015    Delila Pereyra 04/01/2015 Mail  Letter (Out) - 03/20/2015    Delila Pereyra 03/20/2015 Mail  Hospital Encounter - 02/06/2015    Saralyn Pilar, MD 02/06/2015 Fax  Saralyn Pilar, MD 02/06/2015 [facility] Encounter - 01/09/2015    Saralyn Pilar, MD 01/09/2015 [facility] Encounter - 09/12/2014    [facility] 09/13/2014 Fax  Saralyn Pilar, MD 09/13/2014 [facility] Encounter - 07/23/2013    Saralyn Pilar, MD 07/23/2013 Fax  Saralyn Pilar, MD 07/23/2013 Fax  Appointment - 07/19/2013    Saralyn Pilar, MD 07/26/2013 [facility] Encounter - 07/16/2013    Saralyn Pilar, MD 07/16/2013 Fax  Saralyn Pilar, MD 07/16/2013 [facility] Encounter - 01/05/2013    Saralyn Pilar, MD 01/05/2013 Fax    Allergies     LatexHives!Shortness!Of!Breath!Itching^ [id number] RVAMP LAUNCH_OPTIONS;1 RVAMP " data-rvlinknum="2">LatexHives, Shortness Of Breath, Itching  Cough^ [id number] RVAMP LAUNCH_OPTIONS;1 RVAMP " data-rvlinknum="3">Ace InhibitorsCough  Swelling^ [id number] RVAMP LAUNCH_OPTIONS;1 RVAMP " data-rvlinknum="4">OtherSwelling    Medications    buPROPion (WELLBUTRIN SR) 150 MG 12 hr tablet    citalopram (CELEXA) 40 MG tablet    hydrochlorothiazide (HYDRODIURIL) 25 MG tablet    losartan (COZAAR)  100 MG tablet    metFORMIN (GLUCOPHAGE-XR) 500 MG 24 hr tablet    traMADol (ULTRAM) 50 MG tablet    Mark as Reviewed Reviewed by CMA on 12/14/2015   Preferred [place], [place] - [address] [phone] (Phone) [phone] (Fax)  [place], [place] - [address] [phone] (Phone) [phone] (Fax)   Immunizations/Injections   Pneumococcal Polysaccharide-2312/11/2015   Tdap7/01/2014    Significant History/Details   Smoking: Current Every Day Smoker, 0.5 ppd  Smokeless Tobacco: Never  Used  Alcohol: Yes  No open orders  Preferred Language: English   Specialty Comments Show AllReport  No comments regarding your specialty   Family Comments   None                                        Oxygen Therapy    12/14/15 07/13/15 03/19/15  SpO2 100 % 100 % 100 %                                                                                                                                                                                                                                                                                                                                                   My Last Outpatient Progress Note   There are no Outpatient notes of this type for this patient        Pharmacy:  [place], [place] - [address] [phone] (Phone) [phone] (Fax)     Reason for call: Request refill on ALL medications. States she has been out of Metformin for 5 days and her BS is over 300

## 2016-01-25 ENCOUNTER — Ambulatory Visit: Payer: Managed Care, Other (non HMO) | Admitting: Family Medicine

## 2016-01-26 ENCOUNTER — Encounter: Payer: Self-pay | Admitting: Family Medicine

## 2016-01-29 ENCOUNTER — Emergency Department (HOSPITAL_BASED_OUTPATIENT_CLINIC_OR_DEPARTMENT_OTHER)
Admission: EM | Admit: 2016-01-29 | Discharge: 2016-01-29 | Disposition: A | Discharge status: Home / Self Care | Payer: Managed Care, Other (non HMO) | Attending: Emergency Medicine | Admitting: Emergency Medicine

## 2016-01-29 ENCOUNTER — Encounter (HOSPITAL_BASED_OUTPATIENT_CLINIC_OR_DEPARTMENT_OTHER): Payer: Self-pay | Admitting: *Deleted

## 2016-01-29 DIAGNOSIS — E119 Type 2 diabetes mellitus without complications: Secondary | ICD-10-CM | POA: Diagnosis not present

## 2016-01-29 DIAGNOSIS — Z79899 Other long term (current) drug therapy: Secondary | ICD-10-CM | POA: Insufficient documentation

## 2016-01-29 DIAGNOSIS — N946 Dysmenorrhea, unspecified: Secondary | ICD-10-CM | POA: Insufficient documentation

## 2016-01-29 DIAGNOSIS — I10 Essential (primary) hypertension: Secondary | ICD-10-CM | POA: Insufficient documentation

## 2016-01-29 DIAGNOSIS — J45909 Unspecified asthma, uncomplicated: Secondary | ICD-10-CM | POA: Diagnosis not present

## 2016-01-29 DIAGNOSIS — R1032 Left lower quadrant pain: Secondary | ICD-10-CM

## 2016-01-29 DIAGNOSIS — F17210 Nicotine dependence, cigarettes, uncomplicated: Secondary | ICD-10-CM | POA: Insufficient documentation

## 2016-01-29 DIAGNOSIS — Z91040 Latex allergy status: Secondary | ICD-10-CM | POA: Diagnosis not present

## 2016-01-29 DIAGNOSIS — N760 Acute vaginitis: Secondary | ICD-10-CM | POA: Insufficient documentation

## 2016-01-29 DIAGNOSIS — Z7984 Long term (current) use of oral hypoglycemic drugs: Secondary | ICD-10-CM | POA: Diagnosis not present

## 2016-01-29 DIAGNOSIS — B9689 Other specified bacterial agents as the cause of diseases classified elsewhere: Secondary | ICD-10-CM

## 2016-01-29 LAB — URINALYSIS, ROUTINE W REFLEX MICROSCOPIC
Bilirubin Urine: NEGATIVE
Glucose, UA: 100 mg/dL — AB
Ketones, ur: NEGATIVE mg/dL
LEUKOCYTES UA: NEGATIVE
NITRITE: NEGATIVE
PH: 7 (ref 5.0–8.0)
Protein, ur: NEGATIVE mg/dL
SPECIFIC GRAVITY, URINE: 1.02 (ref 1.005–1.030)

## 2016-01-29 LAB — PREGNANCY, URINE: PREG TEST UR: NEGATIVE

## 2016-01-29 LAB — COMPREHENSIVE METABOLIC PANEL
ALBUMIN: 3.7 g/dL (ref 3.5–5.0)
ALK PHOS: 70 U/L (ref 38–126)
ALT: 11 U/L — AB (ref 14–54)
ANION GAP: 9 (ref 5–15)
AST: 19 U/L (ref 15–41)
BUN: 14 mg/dL (ref 6–20)
CALCIUM: 9.1 mg/dL (ref 8.9–10.3)
CHLORIDE: 103 mmol/L (ref 101–111)
CO2: 24 mmol/L (ref 22–32)
CREATININE: 1.25 mg/dL — AB (ref 0.44–1.00)
GFR calc non Af Amer: 50 mL/min — ABNORMAL LOW (ref >60)
GFR, EST AFRICAN AMERICAN: 58 mL/min — AB (ref >60)
GLUCOSE: 130 mg/dL — AB (ref 65–99)
Potassium: 3.7 mmol/L (ref 3.5–5.1)
SODIUM: 136 mmol/L (ref 135–145)
Total Bilirubin: 0.5 mg/dL (ref 0.3–1.2)
Total Protein: 7 g/dL (ref 6.5–8.1)

## 2016-01-29 LAB — URINALYSIS, MICROSCOPIC (REFLEX): WBC UA: NONE SEEN WBC/hpf (ref 0–5)

## 2016-01-29 LAB — WET PREP, GENITAL
SPERM: NONE SEEN
TRICH WET PREP: NONE SEEN
Yeast Wet Prep HPF POC: NONE SEEN

## 2016-01-29 LAB — CBC WITH DIFFERENTIAL/PLATELET
BASOS PCT: 0 %
Basophils Absolute: 0 10*3/uL (ref 0.0–0.1)
Eosinophils Absolute: 0.4 10*3/uL (ref 0.0–0.7)
Eosinophils Relative: 4 %
HEMATOCRIT: 36.7 % (ref 36.0–46.0)
HEMOGLOBIN: 12.4 g/dL (ref 12.0–15.0)
LYMPHS ABS: 2.5 10*3/uL (ref 0.7–4.0)
Lymphocytes Relative: 26 %
MCH: 28.1 pg (ref 26.0–34.0)
MCHC: 33.8 g/dL (ref 30.0–36.0)
MCV: 83 fL (ref 78.0–100.0)
MONOS PCT: 8 %
Monocytes Absolute: 0.8 10*3/uL (ref 0.1–1.0)
NEUTROS ABS: 5.7 10*3/uL (ref 1.7–7.7)
NEUTROS PCT: 62 %
Platelets: 349 10*3/uL (ref 150–400)
RBC: 4.42 MIL/uL (ref 3.87–5.11)
RDW: 13.4 % (ref 11.5–15.5)
WBC: 9.3 10*3/uL (ref 4.0–10.5)

## 2016-01-29 MED ORDER — SODIUM CHLORIDE 0.9 % IV BOLUS (SEPSIS)
1000.0000 mL | Freq: Once | INTRAVENOUS | Status: AC
  Administered 2016-01-29: 1000 mL via INTRAVENOUS

## 2016-01-29 MED ORDER — KETOROLAC TROMETHAMINE 30 MG/ML IJ SOLN
30.0000 mg | Freq: Once | INTRAMUSCULAR | Status: AC
Start: 2016-01-29 — End: 2016-01-29
  Administered 2016-01-29: 30 mg via INTRAVENOUS
  Filled 2016-01-29: qty 1

## 2016-01-29 MED ORDER — HYDROCODONE-ACETAMINOPHEN 5-325 MG PO TABS: 1.0000 | ORAL_TABLET | ORAL | 0 refills | Status: DC | PRN

## 2016-01-29 MED ORDER — IBUPROFEN 600 MG PO TABS: 600.0000 mg | ORAL_TABLET | Freq: Four times a day (QID) | ORAL | 0 refills | Status: DC | PRN

## 2016-01-29 MED ORDER — METRONIDAZOLE 500 MG PO TABS: 500.0000 mg | ORAL_TABLET | Freq: Two times a day (BID) | ORAL | 0 refills | Status: DC

## 2016-01-29 MED ORDER — METRONIDAZOLE 500 MG PO TABS
500.0000 mg | ORAL_TABLET | Freq: Once | ORAL | Status: AC
Start: 2016-01-29 — End: 2016-01-29
  Administered 2016-01-29: 500 mg via ORAL
  Filled 2016-01-29: qty 1

## 2016-01-29 MED ORDER — IBUPROFEN 800 MG PO TABS
800.0000 mg | ORAL_TABLET | Freq: Once | ORAL | Status: AC
  Administered 2016-01-29: 800 mg via ORAL
  Filled 2016-01-29: qty 1

## 2016-01-29 MED FILL — HYDROCODON-APAP 5-325: 5-325 | 2 days supply | Qty: 10 | Fill #0

## 2016-01-29 MED FILL — metroNIDAZOLE 500 MG TABS: 500 | 7 days supply | Qty: 14 | Fill #0

## 2016-01-29 NOTE — ED Provider Notes (Signed)
[facility] DEPT [facility] Provider Note   CSN: [id number] Arrival date & time: 01/29/16  1259     History   Chief Complaint Chief Complaint  Patient presents with  . Abdominal Pain    HPI Renee James is a 46 y.o. female.  Pt presents to the ED today with llq abdominal pain.  She has had irregular and painful periods for "awhile."  She has not been to the ED in years.  She said her periods are usually not this painful.  She did take 2 tylenol without relief of pain.  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  She does not have a right fallopian tube due to ectopic pregnancy.      Past Medical History:  Diagnosis Date  . Allergy   . Anxiety   . Arthritis   . Asthma   . Bell's palsy   . Depression   . Diabetes mellitus without complication ([place])   . Hyperlipidemia   . Hypertension   . Pneumonia   . Renal insufficiency 05/13/2015    Patient Active Problem List   Diagnosis Date Noted  . Renal insufficiency 05/13/2015  . Ovarian torsion 04/28/2015  . Pain   . Hyperlipidemia 03/19/2015  . Obesity, unspecified 03/19/2015  . Controlled type 2 diabetes mellitus without complication, without long-term current use of insulin ([place]) 03/19/2015  . Essential hypertension 03/19/2015  . GAD (generalized anxiety disorder) 03/19/2015    Past Surgical History:  Procedure Laterality Date  . ECTOPIC PREGNANCY SURGERY    . LAPAROSCOPY N/A 04/28/2015   Procedure: LAPAROSCOPY DIAGNOSTIC;  Surgeon: Donnamae Jude, MD;  Location: [facility];  Service: Gynecology;  Laterality: N/A;  . OVARIAN CYST REMOVAL    . TUBAL LIGATION      OB History    Gravida Para Term Preterm AB Living   5       2 3    SAB TAB Ectopic Multiple Live Births     1 1           Home Medications    Prior to Admission medications   Medication Sig Start Date End Date Taking? Authorizing Provider  buPROPion (WELLBUTRIN SR) 150 MG 12 hr tablet Take 1 tablet (150 mg total) by mouth 2 (two)  times daily. 12/14/15  Yes Gay Filler Copland, MD  citalopram (CELEXA) 40 MG tablet Take 1 tablet (40 mg total) by mouth daily. 03/19/15  Yes Gay Filler Copland, MD  hydrochlorothiazide (HYDRODIURIL) 25 MG tablet Take 1 tablet (25 mg total) by mouth daily. 12/14/15  Yes Gay Filler Copland, MD  losartan (COZAAR) 100 MG tablet Take 1 tablet (100 mg total) by mouth daily. 03/19/15  Yes Gay Filler Copland, MD  metFORMIN (GLUCOPHAGE-XR) 500 MG 24 hr tablet Take 4 tablets (2,000 mg total) by mouth daily. 01/18/16  Yes Gay Filler Copland, MD  traMADol (ULTRAM) 50 MG tablet Take 1 tablet (50 mg total) by mouth at bedtime as needed. Use as needed for neck and back pain 12/14/15  Yes Gay Filler Copland, MD  HYDROcodone-acetaminophen (NORCO/VICODIN) 5-325 MG tablet Take 1 tablet by mouth every 4 (four) hours as needed. 01/29/16   Isla Pence, MD  ibuprofen (ADVIL,MOTRIN) 600 MG tablet Take 1 tablet (600 mg total) by mouth every 6 (six) hours as needed. 01/29/16   Isla Pence, MD  metroNIDAZOLE (FLAGYL) 500 MG tablet Take 1 tablet (500 mg total) by mouth 2 (two) times daily. 01/29/16   Almyra Free  Gilford Raid, MD    Family History Family History  Problem Relation Age of Onset  . Diabetes Mother   . Heart disease Mother   . Kidney failure Mother   . Stroke Father   . Kidney failure Father   . Breast cancer      Social History Social History  Substance Use Topics  . Smoking status: Current Every Day Smoker    Packs/day: 0.50    Types: Cigarettes  . Smokeless tobacco: Never Used  . Alcohol use Yes     Allergies   Latex; Ace inhibitors; and Other   Review of Systems Review of Systems  Gastrointestinal: Positive for abdominal pain.  Genitourinary: Positive for vaginal bleeding.  All other systems reviewed and are negative.    Physical Exam Updated Vital Signs BP 155/91 (BP Location: Left Arm)   Pulse (!) 59   Temp 98.1 F (36.7 C) (Oral)   Resp 16   Ht 5\' 3"  (1.6 m)   Wt 193 lb (87.5 kg)   LMP  01/28/2016   SpO2 100%   BMI 34.19 kg/m   Physical Exam  Constitutional: She is oriented to person, place, and time. She appears well-developed and well-nourished.  HENT:  Head: Normocephalic and atraumatic.  Right Ear: External ear normal.  Left Ear: External ear normal.  Nose: Nose normal.  Mouth/Throat: Oropharynx is clear and moist.  Eyes: Conjunctivae and EOM are normal. Pupils are equal, round, and reactive to light.  Neck: Normal range of motion. Neck supple.  Cardiovascular: Normal rate, regular rhythm, normal heart sounds and intact distal pulses.   Pulmonary/Chest: Effort normal and breath sounds normal.  Abdominal: Soft. Bowel sounds are normal. There is tenderness in the left lower quadrant.  Genitourinary: Left adnexum displays tenderness. There is bleeding in the vagina.  Musculoskeletal: Normal range of motion.  Neurological: She is alert and oriented to person, place, and time.  Skin: Skin is warm.  Psychiatric: She has a normal mood and affect. Her behavior is normal. Judgment and thought content normal.  Nursing note and vitals reviewed.    ED Treatments / Results  Labs (all labs ordered are listed, but only abnormal results are displayed) Labs Reviewed  WET PREP, GENITAL - Abnormal; Notable for the following:       Result Value   Clue Cells Wet Prep HPF POC PRESENT (*)    WBC, Wet Prep HPF POC MANY (*)    All other components within normal limits  URINALYSIS, ROUTINE W REFLEX MICROSCOPIC - Abnormal; Notable for the following:    Glucose, UA 100 (*)    Hgb urine dipstick LARGE (*)    All other components within normal limits  COMPREHENSIVE METABOLIC PANEL - Abnormal; Notable for the following:    Glucose, Bld 130 (*)    Creatinine, Ser 1.25 (*)    ALT 11 (*)    GFR calc non Af Amer 50 (*)    GFR calc Af Amer 58 (*)    All other components within normal limits  URINALYSIS, MICROSCOPIC (REFLEX) - Abnormal; Notable for the following:    Bacteria, UA MANY  (*)    Squamous Epithelial / LPF 0-5 (*)    All other components within normal limits  PREGNANCY, URINE  CBC WITH DIFFERENTIAL/PLATELET  GC/CHLAMYDIA PROBE AMP ([place]) NOT AT [facility]    EKG  EKG Interpretation None       Radiology No results found.  Procedures Procedures (including critical care time)  Medications Ordered in  ED Medications  ibuprofen (ADVIL,MOTRIN) tablet 800 mg (800 mg Oral Given 01/29/16 1311)  sodium chloride 0.9 % bolus 1,000 mL (0 mLs Intravenous Stopped 01/29/16 1534)  ketorolac (TORADOL) 30 MG/ML injection 30 mg (30 mg Intravenous Given 01/29/16 1355)  metroNIDAZOLE (FLAGYL) tablet 500 mg (500 mg Oral Given 01/29/16 1534)     Initial Impression / Assessment and Plan / ED Course  I have reviewed the triage vital signs and the nursing notes.  Pertinent labs & imaging results that were available during my care of the patient were reviewed by me and considered in my medical decision making (see chart for details).    I looked at pt's chart on care everywhere. It was thought that pt had an ovarian torsion last year.  She had an expl lap which showed normal ovaries.  It was noted that she had a lot of adhesions.  These were not removed.    Pt is feeling better.  She knows to return if worse and to f/u with obgyn.   Final Clinical Impressions(s) / ED Diagnoses   Final diagnoses:  Left lower quadrant pain  Dysmenorrhea  Bacterial vaginosis    New Prescriptions New Prescriptions   HYDROCODONE-ACETAMINOPHEN (NORCO/VICODIN) 5-325 MG TABLET    Take 1 tablet by mouth every 4 (four) hours as needed.   IBUPROFEN (ADVIL,MOTRIN) 600 MG TABLET    Take 1 tablet (600 mg total) by mouth every 6 (six) hours as needed.   METRONIDAZOLE (FLAGYL) 500 MG TABLET    Take 1 tablet (500 mg total) by mouth 2 (two) times daily.     Isla Pence, MD 01/29/16 1538

## 2016-01-29 NOTE — ED Triage Notes (Signed)
Abdominal cramps. States she started her menses last night. She took Tylenol x 2.

## 2016-02-02 LAB — GC/CHLAMYDIA PROBE AMP (~~LOC~~) NOT AT ARMC
Chlamydia: NEGATIVE
NEISSERIA GONORRHEA: NEGATIVE

## 2016-02-08 ENCOUNTER — Encounter: Payer: Self-pay | Admitting: Family Medicine

## 2016-02-08 ENCOUNTER — Ambulatory Visit (INDEPENDENT_AMBULATORY_CARE_PROVIDER_SITE_OTHER): Payer: Managed Care, Other (non HMO) | Admitting: Family Medicine

## 2016-02-08 VITALS — BP 162/100 | HR 91 | Wt 188.0 lb

## 2016-02-08 DIAGNOSIS — Z0000 Encounter for general adult medical examination without abnormal findings: Secondary | ICD-10-CM

## 2016-02-08 DIAGNOSIS — D259 Leiomyoma of uterus, unspecified: Secondary | ICD-10-CM

## 2016-02-08 DIAGNOSIS — Z01419 Encounter for gynecological examination (general) (routine) without abnormal findings: Secondary | ICD-10-CM

## 2016-02-08 DIAGNOSIS — N939 Abnormal uterine and vaginal bleeding, unspecified: Secondary | ICD-10-CM

## 2016-02-08 DIAGNOSIS — Z8742 Personal history of other diseases of the female genital tract: Secondary | ICD-10-CM

## 2016-02-08 MED ORDER — MEGESTROL ACETATE 40 MG PO TABS: 40.0000 mg | ORAL_TABLET | Freq: Two times a day (BID) | ORAL | 5 refills | Status: DC

## 2016-02-08 MED FILL — MEGESTROL 40 MG TABLET: 40 | 30 days supply | Qty: 60 | Fill #0 | Status: TO

## 2016-02-08 NOTE — Progress Notes (Signed)
Patient complaining of period every two weeks. [name] RN BSN

## 2016-02-08 NOTE — Addendum Note (Signed)
Addended by: Truett Mainland on: 02/08/2016 12:08 PM   Modules accepted: Orders

## 2016-02-08 NOTE — Progress Notes (Signed)
Subjective:     Renee James is a 46 y.o. female and is here for a comprehensive physical exam. The patient reports AUB - for past 3 years having menses every other week. Has heavy bleeding with painful cramps.   Monogamous heterosexual relationship. Has history of abnormal PAPs. No LEEPs, but had colposcopy. Had PAP in 2012 that was ASCUS, HPV negative. Reports last Mammogram as last year, but none in our system.   Social History   Social History  . Marital status: Married    Spouse name: N/A  . Number of children: N/A  . Years of education: N/A   Occupational History  . Not on file.   Social History Main Topics  . Smoking status: Current Every Day Smoker    Packs/day: 0.50    Types: Cigarettes  . Smokeless tobacco: Never Used  . Alcohol use Yes  . Drug use: Unknown  . Sexual activity: Yes    Birth control/ protection: None   Other Topics Concern  . Not on file   Social History Narrative  . No narrative on file   Health Maintenance  Topic Date Due  . FOOT EXAM  07/26/1978  . HIV Screening  07/26/1983  . PAP SMEAR  07/25/1989  . OPHTHALMOLOGY EXAM  02/04/2016  . INFLUENZA VACCINE  04/02/2016 (Originally 08/04/2015)  . HEMOGLOBIN A1C  06/13/2016  . PNEUMOCOCCAL POLYSACCHARIDE VACCINE (2) 12/13/2020  . TETANUS/TDAP  07/03/2025    The following portions of the patient's history were reviewed and updated as appropriate: allergies, current medications, past family history, past medical history, past social history, past surgical history and problem list.  Review of Systems Pertinent items are noted in HPI.   Objective:      General Appearance:    Alert, cooperative, no distress, appears stated age  Head:    Normocephalic, without obvious abnormality, atraumatic  Eyes:    PERRL, conjunctiva/corneas clear, EOM's intact, fundi    benign, both eyes  Neck:   Supple, symmetrical, trachea midline, no adenopathy;    thyroid:  no enlargement/tenderness/nodules; no  carotid   bruit or JVD  Back:     Symmetric, no curvature, ROM normal, no CVA tenderness  Lungs:     Clear to auscultation bilaterally, respirations unlabored  Chest Wall:    No tenderness or deformity   Heart:    Regular rate and rhythm, S1 and S2 normal, no murmur, rub   or gallop  Breast Exam:    No tenderness, masses, or nipple abnormality  Abdomen:     Soft, non-tender, bowel sounds active all four quadrants,    no masses, no organomegaly  Genitalia:    Normal female without lesion, discharge or tenderness.         Vaginal rugae normal.  Cervix normal in appearance.  Uterus   normal in size.  Adnexa normal, no masses or fullness   palpated.  Extremities:   Extremities normal, atraumatic, no cyanosis or edema  Pulses:   2+ and symmetric all extremities  Skin:   Skin color, texture, turgor normal, no rashes or lesions  Lymph nodes:   Cervical, supraclavicular, and axillary nodes normal  Neurologic:   CNII-XII intact, normal strength, sensation and reflexes    throughout     Assessment:    Healthy female exam.      Plan:      1. Well woman exam with routine gynecological exam Discussed exercise, weight loss. - MM Digital Screening; Future - Cytology - PAP  2. Abnormal uterine bleeding Likely secondary to fibroids. Discussed treatment options: medical vs surgical. Patient opted for medical management for now - will start with megace 40mg  bid.   3. Uterine leiomyoma, unspecified location  4. History of abnormal cervical Pap smear PAP today.  See After Visit Summary for Counseling Recommendations

## 2016-02-09 LAB — CYTOLOGY - PAP
Bacterial vaginitis: POSITIVE — AB
CANDIDA VAGINITIS: POSITIVE — AB
DIAGNOSIS: NEGATIVE
HPV: NOT DETECTED
TRICH (WINDOWPATH): NEGATIVE

## 2016-02-10 MED FILL — traMADol HCL 50 MG TABS: 50 | 30 days supply | Qty: 30 | Fill #0 | Status: TO

## 2016-02-11 ENCOUNTER — Encounter: Payer: Self-pay | Admitting: Family Medicine

## 2016-02-11 ENCOUNTER — Other Ambulatory Visit: Payer: Self-pay | Admitting: Family Medicine

## 2016-02-11 MED ORDER — FLUCONAZOLE 150 MG PO TABS
150.0000 mg | ORAL_TABLET | Freq: Once | ORAL | 3 refills | Status: DC
Start: 2016-02-11 — End: 2016-02-11

## 2016-02-11 MED ORDER — METRONIDAZOLE 500 MG PO TABS: 500.0000 mg | ORAL_TABLET | Freq: Two times a day (BID) | ORAL | 0 refills | Status: DC

## 2016-02-11 MED ORDER — METRONIDAZOLE 500 MG PO TABS
500.0000 mg | ORAL_TABLET | Freq: Two times a day (BID) | ORAL | 0 refills | Status: DC
Start: 2016-02-11 — End: 2016-03-04

## 2016-02-11 MED ORDER — FLUCONAZOLE 150 MG PO TABS: 150.0000 mg | ORAL_TABLET | Freq: Once | ORAL | 3 refills | Status: AC

## 2016-02-11 MED FILL — FLUCONAZOLE 150 MG TABLET: 150 | 1 days supply | Qty: 1 | Fill #0 | Status: TO

## 2016-02-11 MED FILL — metroNIDAZOLE 500 MG TABS: 500 | 7 days supply | Qty: 14 | Fill #0

## 2016-02-15 ENCOUNTER — Ambulatory Visit (HOSPITAL_BASED_OUTPATIENT_CLINIC_OR_DEPARTMENT_OTHER)
Admission: RE | Admit: 2016-02-15 | Discharge: 2016-02-15 | Disposition: A | Discharge status: Home / Self Care | Payer: Managed Care, Other (non HMO) | Source: Ambulatory Visit | Attending: Family Medicine | Admitting: Family Medicine

## 2016-02-15 ENCOUNTER — Encounter (HOSPITAL_BASED_OUTPATIENT_CLINIC_OR_DEPARTMENT_OTHER): Payer: Self-pay

## 2016-02-15 DIAGNOSIS — Z01419 Encounter for gynecological examination (general) (routine) without abnormal findings: Secondary | ICD-10-CM

## 2016-02-15 DIAGNOSIS — Z1231 Encounter for screening mammogram for malignant neoplasm of breast: Secondary | ICD-10-CM | POA: Insufficient documentation

## 2016-02-17 ENCOUNTER — Other Ambulatory Visit: Payer: Self-pay | Admitting: Family Medicine

## 2016-02-17 MED ORDER — MEDROXYPROGESTERONE ACETATE 10 MG PO TABS: 20.0000 mg | ORAL_TABLET | Freq: Every day | ORAL | 2 refills | Status: DC

## 2016-02-17 MED FILL — MEDROXYPROGESTERONE 10 MG T: 10 | 30 days supply | Qty: 60 | Fill #0 | Status: TO

## 2016-02-18 ENCOUNTER — Telehealth: Payer: Self-pay

## 2016-02-18 NOTE — Telephone Encounter (Signed)
Patient made aware that this could be a side affect of the Megace and Dr. Nehemiah Settle has sent in a new script for Provera. She is to discontinue the Megace and take two tablets (40mg ) of the Provera once a day. Patient states understanding. [name] RNBSN

## 2016-02-18 NOTE — Telephone Encounter (Signed)
-----   Message from Truett Mainland, DO sent at 02/17/2016  5:01 PM EST ----- Regarding: RE: Elevated sugars Occasionally this happens with megace. I changed it to provera 20mg  (2 tablets at one time) daily.  ----- Message ----- From: Doran Heater, RN Sent: 02/17/2016   8:12 AM To: Truett Mainland, DO Subject: FW: Elevated sugars                              ----- Message ----- From: Renee James Sent: 02/16/2016   3:19 PM To: Truett Mainland, DO, [facility] Clinical Subject: Elevated sugars                                Patient called and stated she was given two prescriptions last week and since starting these medications her sugars are now in the 300's range.  She wants to know if this is a common side effect and what she could be switched to as an alternate medication.

## 2016-02-23 ENCOUNTER — Emergency Department (HOSPITAL_BASED_OUTPATIENT_CLINIC_OR_DEPARTMENT_OTHER): Payer: Managed Care, Other (non HMO)

## 2016-02-23 ENCOUNTER — Encounter (HOSPITAL_BASED_OUTPATIENT_CLINIC_OR_DEPARTMENT_OTHER): Payer: Self-pay

## 2016-02-23 ENCOUNTER — Emergency Department (HOSPITAL_BASED_OUTPATIENT_CLINIC_OR_DEPARTMENT_OTHER)
Admission: EM | Admit: 2016-02-23 | Discharge: 2016-02-23 | Disposition: A | Discharge status: Home / Self Care | Payer: Managed Care, Other (non HMO) | Attending: Emergency Medicine | Admitting: Emergency Medicine

## 2016-02-23 DIAGNOSIS — J069 Acute upper respiratory infection, unspecified: Secondary | ICD-10-CM

## 2016-02-23 DIAGNOSIS — J45909 Unspecified asthma, uncomplicated: Secondary | ICD-10-CM | POA: Insufficient documentation

## 2016-02-23 DIAGNOSIS — I10 Essential (primary) hypertension: Secondary | ICD-10-CM | POA: Diagnosis not present

## 2016-02-23 DIAGNOSIS — Z79899 Other long term (current) drug therapy: Secondary | ICD-10-CM | POA: Insufficient documentation

## 2016-02-23 DIAGNOSIS — R05 Cough: Secondary | ICD-10-CM | POA: Diagnosis present

## 2016-02-23 DIAGNOSIS — F17210 Nicotine dependence, cigarettes, uncomplicated: Secondary | ICD-10-CM | POA: Insufficient documentation

## 2016-02-23 DIAGNOSIS — Z7984 Long term (current) use of oral hypoglycemic drugs: Secondary | ICD-10-CM | POA: Insufficient documentation

## 2016-02-23 DIAGNOSIS — E119 Type 2 diabetes mellitus without complications: Secondary | ICD-10-CM | POA: Insufficient documentation

## 2016-02-23 DIAGNOSIS — B9789 Other viral agents as the cause of diseases classified elsewhere: Secondary | ICD-10-CM

## 2016-02-23 MED ORDER — PREDNISONE 50 MG PO TABS: 50.0000 mg | ORAL_TABLET | Freq: Every day | ORAL | 0 refills | Status: DC

## 2016-02-23 MED ORDER — PREDNISONE 50 MG PO TABS
60.0000 mg | ORAL_TABLET | Freq: Once | ORAL | Status: AC
  Administered 2016-02-23: 19:00:00 60 mg via ORAL
  Filled 2016-02-23: qty 1

## 2016-02-23 MED ORDER — IPRATROPIUM-ALBUTEROL 0.5-2.5 (3) MG/3ML IN SOLN
3.0000 mL | Freq: Once | RESPIRATORY_TRACT | Status: AC
  Administered 2016-02-23: 3 mL via RESPIRATORY_TRACT
  Filled 2016-02-23: qty 3

## 2016-02-23 MED ORDER — ALBUTEROL SULFATE HFA 108 (90 BASE) MCG/ACT IN AERS
1.0000 | INHALATION_SPRAY | RESPIRATORY_TRACT | Status: DC | PRN
  Administered 2016-02-23: 2 via RESPIRATORY_TRACT
  Filled 2016-02-23: qty 6.7

## 2016-02-23 MED ORDER — BENZONATATE 100 MG PO CAPS: 100.0000 mg | ORAL_CAPSULE | Freq: Three times a day (TID) | ORAL | 0 refills | Status: DC

## 2016-02-23 NOTE — ED Notes (Signed)
Pt coughing in waiting room. Checked on pt. Pt states she is okay, just coughing.

## 2016-02-23 NOTE — ED Triage Notes (Addendum)
C/o cough x 4 days-NAD-forceful cough in triage-NAD-steady gait-denies pain except when she coughs-pain to right and back of neck with cough

## 2016-02-23 NOTE — ED Provider Notes (Signed)
[facility] DEPT [facility] Provider Note   CSN: [id number] Arrival date & time: 02/23/16  1606  By signing my name below, I, Renee James, attest that this documentation has been prepared under the direction and in the presence of Dorie Rank, MD. Electronically Signed: Sonum James, Education administrator. 02/23/16. 7:10 PM.  History   Chief Complaint Chief Complaint  Patient presents with  . Cough    The history is provided by the patient. No language interpreter was used.     HPI Comments: Renee James is a 46 y.o. female who presents to the Emergency Department complaining of persistent, unchanged cough productive of sputum that began 5 days ago. She has associated mild SOB, chest discomfort, and HA due to the coughing spells. She denies sore throat or fever. She has a history of asthma. She is a daily smoker but is attempting to quit.   Past Medical History:  Diagnosis Date  . Allergy   . Anxiety   . Arthritis   . Asthma   . Bell's palsy   . Depression   . Diabetes mellitus without complication ([place])   . Hyperlipidemia   . Hypertension   . Pneumonia   . Renal insufficiency 05/13/2015    Patient Active Problem List   Diagnosis Date Noted  . Renal insufficiency 05/13/2015  . Ovarian torsion 04/28/2015  . Pain   . Hyperlipidemia 03/19/2015  . Obesity, unspecified 03/19/2015  . Controlled type 2 diabetes mellitus without complication, without long-term current use of insulin ([place]) 03/19/2015  . Essential hypertension 03/19/2015  . GAD (generalized anxiety disorder) 03/19/2015    Past Surgical History:  Procedure Laterality Date  . ECTOPIC PREGNANCY SURGERY    . LAPAROSCOPY N/A 04/28/2015   Procedure: LAPAROSCOPY DIAGNOSTIC;  Surgeon: Donnamae Jude, MD;  Location: [facility];  Service: Gynecology;  Laterality: N/A;  . OVARIAN CYST REMOVAL    . TUBAL LIGATION      OB History    Gravida Para Term Preterm AB Living   5       2 3    SAB TAB Ectopic Multiple Live Births     1 1            Home Medications    Prior to Admission medications   Medication Sig Start Date End Date Taking? Authorizing Provider  benzonatate (TESSALON) 100 MG capsule Take 1 capsule (100 mg total) by mouth every 8 (eight) hours. 02/23/16   Dorie Rank, MD  buPROPion [facility] SR) 150 MG 12 hr tablet Take 1 tablet (150 mg total) by mouth 2 (two) times daily. 12/14/15   Gay Filler Copland, MD  citalopram (CELEXA) 40 MG tablet Take 1 tablet (40 mg total) by mouth daily. Patient not taking: Reported on 02/08/2016 03/19/15   Darreld Mclean, MD  hydrochlorothiazide (HYDRODIURIL) 25 MG tablet Take 1 tablet (25 mg total) by mouth daily. 12/14/15   Darreld Mclean, MD  HYDROcodone-acetaminophen (NORCO/VICODIN) 5-325 MG tablet Take 1 tablet by mouth every 4 (four) hours as needed. Patient not taking: Reported on 02/08/2016 01/29/16   Isla Pence, MD  ibuprofen (ADVIL,MOTRIN) 600 MG tablet Take 1 tablet (600 mg total) by mouth every 6 (six) hours as needed. Patient not taking: Reported on 02/08/2016 01/29/16   Isla Pence, MD  losartan (COZAAR) 100 MG tablet Take 1 tablet (100 mg total) by mouth daily. 03/19/15   Darreld Mclean, MD  medroxyPROGESTERone (PROVERA) 10 MG tablet Take 2 tablets (20 mg total) by mouth daily. 02/17/16  Tanna Savoy Stinson, DO  metFORMIN (GLUCOPHAGE-XR) 500 MG 24 hr tablet Take 4 tablets (2,000 mg total) by mouth daily. 01/18/16   Gay Filler Copland, MD  metroNIDAZOLE (FLAGYL) 500 MG tablet Take 1 tablet (500 mg total) by mouth 2 (two) times daily. 02/11/16   Truett Mainland, DO  predniSONE (DELTASONE) 50 MG tablet Take 1 tablet (50 mg total) by mouth daily. 02/23/16   Dorie Rank, MD  traMADol (ULTRAM) 50 MG tablet Take 1 tablet (50 mg total) by mouth at bedtime as needed. Use as needed for neck and back pain 12/14/15   Darreld Mclean, MD    Family History Family History  Problem Relation Age of Onset  . Diabetes Mother   . Heart disease Mother   . Kidney failure Mother   . Stroke  Father   . Kidney failure Father   . Breast cancer      Social History Social History  Substance Use Topics  . Smoking status: Current Every Day Smoker    Packs/day: 0.50    Types: Cigarettes  . Smokeless tobacco: Never Used  . Alcohol use Yes     Comment: occ     Allergies   Latex; Ace inhibitors; and Other   Review of Systems Review of Systems  Constitutional: Negative for fever.  HENT: Negative for sore throat.   Respiratory: Positive for cough and shortness of breath.   Neurological: Positive for headaches.     Physical Exam Updated Vital Signs BP 151/79 (BP Location: Left Arm)   Pulse 103   Temp 98.8 F (37.1 C) (Oral)   Resp 18   Ht 5\' 3"  (1.6 m)   Wt 81.2 kg   LMP 02/04/2016 (Exact Date)   SpO2 99%   BMI 31.71 kg/m   Physical Exam  Constitutional: She appears well-developed and well-nourished. No distress.  HENT:  Head: Normocephalic and atraumatic.  Right Ear: External ear normal.  Left Ear: External ear normal.  Mouth/Throat: Oropharynx is clear and moist. No oropharyngeal exudate.  Eyes: Conjunctivae are normal. Right eye exhibits no discharge. Left eye exhibits no discharge. No scleral icterus.  Neck: Neck supple. No tracheal deviation present.  Cardiovascular: Normal rate, regular rhythm and intact distal pulses.   Pulmonary/Chest: Effort normal. No stridor. No respiratory distress. She has wheezes. She has no rales.  Abdominal: Soft. Bowel sounds are normal. She exhibits no distension. There is no tenderness. There is no rebound and no guarding.  Musculoskeletal: She exhibits no edema or tenderness.  Neurological: She is alert. She has normal strength. No cranial nerve deficit (no facial droop, extraocular movements intact, no slurred speech) or sensory deficit. She exhibits normal muscle tone. She displays no seizure activity. Coordination normal.  Skin: Skin is warm and dry. No rash noted.  Psychiatric: She has a normal mood and affect.   Nursing note and vitals reviewed.    ED Treatments / Results  DIAGNOSTIC STUDIES: Oxygen Saturation is 100% on RA, normal by my interpretation.    COORDINATION OF CARE: 7:10 PM Discussed treatment plan with pt at bedside and pt agreed to plan.    Radiology Dg Chest 2 View  Result Date: 02/23/2016 CLINICAL DATA:  Acute onset of uncontrolled cough, wheezing and shortness of breath. Generalized chest pain. Initial encounter. EXAM: CHEST  2 VIEW COMPARISON:  Chest radiograph performed 09/10/2015 FINDINGS: The lungs are well-aerated. Mild peribronchial thickening is noted. There is no evidence of focal opacification, pleural effusion or pneumothorax. The heart is normal  in size; the mediastinal contour is within normal limits. No acute osseous abnormalities are seen. IMPRESSION: Mild peribronchial thickening noted.  Lungs otherwise grossly clear. Electronically Signed   By: Garald Balding M.D.   On: 02/23/2016 19:14    Procedures Procedures (including critical care time)  Medications Ordered in ED Medications  albuterol (PROVENTIL HFA;VENTOLIN HFA) 108 (90 Base) MCG/ACT inhaler 1-2 puff (2 puffs Inhalation Given 02/23/16 1932)  predniSONE (DELTASONE) tablet 60 mg (60 mg Oral Given 02/23/16 1927)  ipratropium-albuterol (DUONEB) 0.5-2.5 (3) MG/3ML nebulizer solution 3 mL (3 mLs Nebulization Given 02/23/16 2137)     Initial Impression / Assessment and Plan / ED Course  I have reviewed the triage vital signs and the nursing notes.  Pertinent labs & imaging results that were available during my care of the patient were reviewed by me and considered in my medical decision making (see chart for details).   the patient presents to the emergency room with cough and congestion. She has some mild wheezing noted on exam. No pneumonia on her chest x-ray. I suspect her symptoms are related to a viral infection triggering some bronchospasm and bronchitis.  Discharge home with steroids and Tessalon. She  was given an albuterol inhaler in the emergency room.   Final Clinical Impressions(s) / ED Diagnoses   Final diagnoses:  Viral URI with cough    New Prescriptions New Prescriptions   BENZONATATE (TESSALON) 100 MG CAPSULE    Take 1 capsule (100 mg total) by mouth every 8 (eight) hours.   PREDNISONE (DELTASONE) 50 MG TABLET    Take 1 tablet (50 mg total) by mouth daily.   I personally performed the services described in this documentation, which was scribed in my presence.  The recorded information has been reviewed and is accurate.    Dorie Rank, MD 02/23/16 [phone]

## 2016-02-23 NOTE — ED Notes (Signed)
Crystal Mabe, RT in for assessment

## 2016-02-23 NOTE — Discharge Instructions (Signed)
Follow-up with your primary doctor if not getting better within the week, take the steroids and continue to use the inhaler, Tessalon should help with her cough

## 2016-02-25 ENCOUNTER — Telehealth: Payer: Self-pay | Admitting: Family Medicine

## 2016-02-25 ENCOUNTER — Emergency Department (HOSPITAL_BASED_OUTPATIENT_CLINIC_OR_DEPARTMENT_OTHER)
Admission: EM | Admit: 2016-02-25 | Discharge: 2016-02-25 | Disposition: A | Discharge status: Home / Self Care | Payer: Managed Care, Other (non HMO) | Attending: Emergency Medicine | Admitting: Emergency Medicine

## 2016-02-25 ENCOUNTER — Encounter: Payer: Self-pay | Admitting: Emergency Medicine

## 2016-02-25 ENCOUNTER — Encounter: Payer: Self-pay | Admitting: Family Medicine

## 2016-02-25 ENCOUNTER — Encounter (HOSPITAL_BASED_OUTPATIENT_CLINIC_OR_DEPARTMENT_OTHER): Payer: Self-pay | Admitting: *Deleted

## 2016-02-25 ENCOUNTER — Emergency Department (HOSPITAL_BASED_OUTPATIENT_CLINIC_OR_DEPARTMENT_OTHER): Payer: Managed Care, Other (non HMO)

## 2016-02-25 DIAGNOSIS — F17210 Nicotine dependence, cigarettes, uncomplicated: Secondary | ICD-10-CM | POA: Diagnosis not present

## 2016-02-25 DIAGNOSIS — I10 Essential (primary) hypertension: Secondary | ICD-10-CM | POA: Diagnosis not present

## 2016-02-25 DIAGNOSIS — Z7984 Long term (current) use of oral hypoglycemic drugs: Secondary | ICD-10-CM | POA: Insufficient documentation

## 2016-02-25 DIAGNOSIS — E119 Type 2 diabetes mellitus without complications: Secondary | ICD-10-CM | POA: Insufficient documentation

## 2016-02-25 DIAGNOSIS — Z79899 Other long term (current) drug therapy: Secondary | ICD-10-CM | POA: Diagnosis not present

## 2016-02-25 DIAGNOSIS — J069 Acute upper respiratory infection, unspecified: Secondary | ICD-10-CM | POA: Insufficient documentation

## 2016-02-25 DIAGNOSIS — J45909 Unspecified asthma, uncomplicated: Secondary | ICD-10-CM | POA: Diagnosis not present

## 2016-02-25 DIAGNOSIS — R05 Cough: Secondary | ICD-10-CM | POA: Diagnosis present

## 2016-02-25 MED ORDER — ALBUTEROL SULFATE HFA 108 (90 BASE) MCG/ACT IN AERS: 1.0000 | INHALATION_SPRAY | Freq: Four times a day (QID) | RESPIRATORY_TRACT | 0 refills | Status: DC | PRN

## 2016-02-25 MED ORDER — IPRATROPIUM-ALBUTEROL 0.5-2.5 (3) MG/3ML IN SOLN
3.0000 mL | Freq: Once | RESPIRATORY_TRACT | Status: AC
  Administered 2016-02-25: 3 mL via RESPIRATORY_TRACT
  Filled 2016-02-25: qty 3

## 2016-02-25 MED FILL — predniSONE 50 MG TABS: 50 | 5 days supply | Qty: 5 | Fill #0

## 2016-02-25 MED FILL — BUPROPION SR 150 MG TABLET: 150 | 30 days supply | Qty: 60 | Fill #1 | Status: TO

## 2016-02-25 MED FILL — LOSARTAN POTASSIUM 100 MG T: 100 | 30 days supply | Qty: 30 | Fill #1 | Status: TO

## 2016-02-25 MED FILL — METFORMIN HCL ER 500 MG TAB: 500 | 30 days supply | Qty: 120 | Fill #1 | Status: TO

## 2016-02-25 NOTE — Discharge Instructions (Signed)
1. Medications: Start the prednisone that was prescribed to you 2 days ago, use albuterol as needed for wheezing, usual home medications 2. Treatment: rest, drink plenty of fluids, take tylenol or ibuprofen for fever control 3. Follow Up: Please followup with your primary doctor in 3 days for discussion of your diagnoses and further evaluation after today's visit; if you do not have a primary care doctor use the resource guide provided to find one; Return to the ER for high fevers, difficulty breathing or other concerning symptoms   Get help right away if: You have severe or persistent: Headache. Ear pain. Sinus pain. Chest pain. You have chronic lung disease and any of the following: Wheezing. Prolonged cough. Coughing up blood. A change in your usual mucus. You have a stiff neck. You have changes in your: Vision. Hearing. Thinking. Mood.

## 2016-02-25 NOTE — ED Provider Notes (Signed)
[facility] DEPT [facility] Provider Note   CSN: [id number] Arrival date & time: 02/25/16  1615  By signing my name below, I, Dora Sims, attest that this documentation has been prepared under the direction and in the presence of Dejana Pugsley, [place]. Electronically Signed: Dora Sims, Scribe. 02/25/2016. 4:45 PM.  History   Chief Complaint Chief Complaint  Patient presents with  . Cough    The history is provided by the patient. No language interpreter was used.     HPI Comments: Renee James is a 46 y.o. female with PMHx including asthma, bronchitis, pneumonia, DM type II, HTN, and HLD who presents to the Emergency Department complaining of a persistent, gradually worsening productive cough beginning 7 days ago. She reports associated SOB/wheezing, cervical lymphadenopathy, and a headache secondary to coughing. She reports she had two episodes of diarrhea yesterday but had none prior to yesterday and has had none since then. Pt was seen here on 2/20 for the same and was diagnosed with a URI; she was prescribed an albuterol inhaler, prednisone, and tessalon perles which have provided no improvement of her respiratory symptoms. Pt notes she has finished her albuterol inhaler prescription. She denies abdominal pain, nausea, vomiting, fever, chills, or any other associated symptoms.  Past Medical History:  Diagnosis Date  . Allergy   . Anxiety   . Arthritis   . Asthma   . Bell's palsy   . Depression   . Diabetes mellitus without complication ([place])   . Hyperlipidemia   . Hypertension   . Pneumonia   . Renal insufficiency 05/13/2015    Patient Active Problem List   Diagnosis Date Noted  . Renal insufficiency 05/13/2015  . Ovarian torsion 04/28/2015  . Pain   . Hyperlipidemia 03/19/2015  . Obesity, unspecified 03/19/2015  . Controlled type 2 diabetes mellitus without complication, without long-term current use of insulin ([place]) 03/19/2015  . Essential hypertension 03/19/2015   . GAD (generalized anxiety disorder) 03/19/2015    Past Surgical History:  Procedure Laterality Date  . ECTOPIC PREGNANCY SURGERY    . LAPAROSCOPY N/A 04/28/2015   Procedure: LAPAROSCOPY DIAGNOSTIC;  Surgeon: Donnamae Jude, MD;  Location: [facility];  Service: Gynecology;  Laterality: N/A;  . OVARIAN CYST REMOVAL    . TUBAL LIGATION      OB History    Gravida Para Term Preterm AB Living   5       2 3    SAB TAB Ectopic Multiple Live Births     1 1           Home Medications    Prior to Admission medications   Medication Sig Start Date End Date Taking? Authorizing Provider  albuterol (PROVENTIL HFA;VENTOLIN HFA) 108 (90 Base) MCG/ACT inhaler Inhale 1-2 puffs into the lungs every 6 (six) hours as needed for wheezing or shortness of breath. 02/25/16   Kemper Durie [place], [place]  benzonatate (TESSALON) 100 MG capsule Take 1 capsule (100 mg total) by mouth every 8 (eight) hours. 02/23/16   Dorie Rank, MD  buPROPion [facility] SR) 150 MG 12 hr tablet Take 1 tablet (150 mg total) by mouth 2 (two) times daily. 12/14/15   Gay Filler Copland, MD  citalopram (CELEXA) 40 MG tablet Take 1 tablet (40 mg total) by mouth daily. Patient not taking: Reported on 02/08/2016 03/19/15   Darreld Mclean, MD  hydrochlorothiazide (HYDRODIURIL) 25 MG tablet Take 1 tablet (25 mg total) by mouth daily. 12/14/15   Darreld Mclean, MD  HYDROcodone-acetaminophen (NORCO/VICODIN)  5-325 MG tablet Take 1 tablet by mouth every 4 (four) hours as needed. Patient not taking: Reported on 02/08/2016 01/29/16   Isla Pence, MD  ibuprofen (ADVIL,MOTRIN) 600 MG tablet Take 1 tablet (600 mg total) by mouth every 6 (six) hours as needed. Patient not taking: Reported on 02/08/2016 01/29/16   Isla Pence, MD  losartan (COZAAR) 100 MG tablet Take 1 tablet (100 mg total) by mouth daily. 03/19/15   Darreld Mclean, MD  medroxyPROGESTERone (PROVERA) 10 MG tablet Take 2 tablets (20 mg total) by mouth daily. 02/17/16   Truett Mainland, DO   metFORMIN (GLUCOPHAGE-XR) 500 MG 24 hr tablet Take 4 tablets (2,000 mg total) by mouth daily. 01/18/16   Gay Filler Copland, MD  metroNIDAZOLE (FLAGYL) 500 MG tablet Take 1 tablet (500 mg total) by mouth 2 (two) times daily. 02/11/16   Truett Mainland, DO  predniSONE (DELTASONE) 50 MG tablet Take 1 tablet (50 mg total) by mouth daily. 02/23/16   Dorie Rank, MD  traMADol (ULTRAM) 50 MG tablet Take 1 tablet (50 mg total) by mouth at bedtime as needed. Use as needed for neck and back pain 12/14/15   Darreld Mclean, MD    Family History Family History  Problem Relation Age of Onset  . Diabetes Mother   . Heart disease Mother   . Kidney failure Mother   . Stroke Father   . Kidney failure Father   . Breast cancer      Social History Social History  Substance Use Topics  . Smoking status: Current Every Day Smoker    Packs/day: 0.50    Types: Cigarettes  . Smokeless tobacco: Never Used  . Alcohol use Yes     Comment: occ     Allergies   Latex; Ace inhibitors; and Other   Review of Systems Review of Systems  Constitutional: Negative for chills and fever.  Respiratory: Positive for cough, shortness of breath and wheezing.   Gastrointestinal: Positive for diarrhea. Negative for abdominal pain, nausea and vomiting.  Neurological: Positive for headaches.  Hematological: Positive for adenopathy.     Physical Exam Updated Vital Signs BP 134/81   Pulse 85   Temp 99 F (37.2 C) (Oral)   Resp 18   Ht 5\' 3"  (1.6 m)   Wt 81.2 kg   LMP 02/04/2016 (Exact Date)   SpO2 100%   BMI 31.71 kg/m   Physical Exam  Constitutional: She is oriented to person, place, and time. She appears well-developed and well-nourished.  Well appearing  HENT:  Head: Normocephalic and atraumatic.  Right Ear: External ear normal.  Left Ear: External ear normal.  Nose: Nose normal.  Mouth/Throat: Oropharynx is clear and moist. No oropharyngeal exudate.  Oropharynx without evidence of redness or  exudates. Tonsils without evidence of redness, swelling, or exudates. TM's appear normal with no evidence of bulging. EAC appear non erythematous and not swollen  Eyes: EOM are normal. Pupils are equal, round, and reactive to light.  Neck: Normal range of motion.  Normal ROM. No nuchal rigidity.   Cardiovascular: Normal rate and normal heart sounds.   Pulmonary/Chest: Effort normal. No respiratory distress. She has wheezes. She has no rales.  Mild diffuse expiratory wheezing. No rales. No stridor. Normal work of breathing  Abdominal: Soft. There is no tenderness. There is no rebound and no guarding.  Soft and nontender. No rebound. No guarding. Negative murphy's sign. No focal tenderness at McBurney's point. No CVA tenderness. No evidence of hernia  Neurological: She is alert and oriented to person, place, and time.  Skin: Skin is warm.  Psychiatric: She has a normal mood and affect. Her behavior is normal.  Nursing note and vitals reviewed.    ED Treatments / Results  Labs (all labs ordered are listed, but only abnormal results are displayed) Labs Reviewed - No data to display  EKG  EKG Interpretation None       Radiology Dg Chest 2 View  Result Date: 02/25/2016 CLINICAL DATA:  Cough, swollen lymph nodes, and sore throat for 3 days, history hypertension, diabetes mellitus, smoker EXAM: CHEST  2 VIEW COMPARISON:  02/23/2016 FINDINGS: Upper normal heart size. Mediastinal contours and pulmonary vascularity normal. Bronchitic changes without infiltrate, pleural effusion or pneumothorax. No acute osseous findings. IMPRESSION: Mild persistent bronchitic changes without infiltrate. Electronically Signed   By: Lavonia Dana M.D.   On: 02/25/2016 18:04   Dg Chest 2 View  Result Date: 02/23/2016 CLINICAL DATA:  Acute onset of uncontrolled cough, wheezing and shortness of breath. Generalized chest pain. Initial encounter. EXAM: CHEST  2 VIEW COMPARISON:  Chest radiograph performed 09/10/2015  FINDINGS: The lungs are well-aerated. Mild peribronchial thickening is noted. There is no evidence of focal opacification, pleural effusion or pneumothorax. The heart is normal in size; the mediastinal contour is within normal limits. No acute osseous abnormalities are seen. IMPRESSION: Mild peribronchial thickening noted.  Lungs otherwise grossly clear. Electronically Signed   By: Garald Balding M.D.   On: 02/23/2016 19:14    Procedures Procedures (including critical care time)  DIAGNOSTIC STUDIES: Oxygen Saturation is 100% on RA, normal by my interpretation.    COORDINATION OF CARE: 4:55 PM Discussed treatment plan with pt at bedside and pt agreed to plan.  Medications Ordered in ED Medications  ipratropium-albuterol (DUONEB) 0.5-2.5 (3) MG/3ML nebulizer solution 3 mL (3 mLs Nebulization Given 02/25/16 1707)     Initial Impression / Assessment and Plan / ED Course  I have reviewed the triage vital signs and the nursing notes.  Pertinent labs & imaging results that were available during my care of the patient were reviewed by me and considered in my medical decision making (see chart for details).     Patient with likely URI causing bronchospasm. No concern for PNA, pneumothorax, or effusion given normal lung exam/x-ray. Patient recently seen 2 days ago and prescribed prednisone which she had not started but states her husband was picking up the prescription now to begin the treatment today. Patient is in no apparent distress. Hemodynamically stable. No increased work of breathing. Antibiotics not indicated. Conservative therapy indicated. Patient told to follow up with PCP in 3-5 days. Return precautions given for new or worsening symptoms. Patient acknowledges and agrees with assessment and plan. Patient verbalizes understanding. Reasons to immediately return to emergency department discussed.    Final Clinical Impressions(s) / ED Diagnoses   Final diagnoses:  Upper respiratory tract  infection, unspecified type    New Prescriptions New Prescriptions   ALBUTEROL (PROVENTIL HFA;VENTOLIN HFA) 108 (90 BASE) MCG/ACT INHALER    Inhale 1-2 puffs into the lungs every 6 (six) hours as needed for wheezing or shortness of breath.   I personally performed the services described in this documentation, which was scribed in my presence. The recorded information has been reviewed and is accurate.   [place], [place] 02/25/16 1827    Davonna Belling, MD 02/26/16 0001

## 2016-02-25 NOTE — Telephone Encounter (Signed)
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 Patient states benzonatate (TESSALON) 100 MG capsule was prescribed for cough and medication is not working requesting another Rx, please advise

## 2016-02-25 NOTE — ED Triage Notes (Signed)
She was seen 2 days ago for cough. She was started on an inhaler for URI. She did not get the Prednisone Rx filled. She is back today with hoarseness, continued cough, and swelling in her neck. She is speaking in complete sentences with no difficulty. Her voice is not raspy at triage.

## 2016-03-01 NOTE — Telephone Encounter (Signed)
Letter printed and given to pt on 02/25/16.

## 2016-03-04 ENCOUNTER — Encounter: Payer: Self-pay | Admitting: Medical

## 2016-03-04 ENCOUNTER — Ambulatory Visit (INDEPENDENT_AMBULATORY_CARE_PROVIDER_SITE_OTHER): Payer: Managed Care, Other (non HMO) | Admitting: Medical

## 2016-03-04 ENCOUNTER — Encounter (HOSPITAL_BASED_OUTPATIENT_CLINIC_OR_DEPARTMENT_OTHER): Payer: Self-pay | Admitting: Emergency Medicine

## 2016-03-04 ENCOUNTER — Emergency Department (HOSPITAL_BASED_OUTPATIENT_CLINIC_OR_DEPARTMENT_OTHER)
Admission: EM | Admit: 2016-03-04 | Discharge: 2016-03-04 | Disposition: A | Discharge status: Home / Self Care | Payer: Managed Care, Other (non HMO) | Attending: Emergency Medicine | Admitting: Emergency Medicine

## 2016-03-04 VITALS — BP 139/88 | HR 83 | Temp 98.5°F | Resp 16 | Ht 64.0 in | Wt 184.0 lb

## 2016-03-04 DIAGNOSIS — I10 Essential (primary) hypertension: Secondary | ICD-10-CM | POA: Insufficient documentation

## 2016-03-04 DIAGNOSIS — R739 Hyperglycemia, unspecified: Secondary | ICD-10-CM | POA: Diagnosis not present

## 2016-03-04 DIAGNOSIS — Z7984 Long term (current) use of oral hypoglycemic drugs: Secondary | ICD-10-CM | POA: Insufficient documentation

## 2016-03-04 DIAGNOSIS — J45909 Unspecified asthma, uncomplicated: Secondary | ICD-10-CM | POA: Insufficient documentation

## 2016-03-04 DIAGNOSIS — F17210 Nicotine dependence, cigarettes, uncomplicated: Secondary | ICD-10-CM | POA: Insufficient documentation

## 2016-03-04 DIAGNOSIS — IMO0001 Reserved for inherently not codable concepts without codable children: Secondary | ICD-10-CM

## 2016-03-04 DIAGNOSIS — E1165 Type 2 diabetes mellitus with hyperglycemia: Secondary | ICD-10-CM | POA: Diagnosis not present

## 2016-03-04 LAB — BASIC METABOLIC PANEL
Anion gap: 9 (ref 5–15)
BUN: 15 mg/dL (ref 6–20)
CHLORIDE: 105 mmol/L (ref 101–111)
CO2: 20 mmol/L — AB (ref 22–32)
CREATININE: 1.39 mg/dL — AB (ref 0.44–1.00)
Calcium: 9.1 mg/dL (ref 8.9–10.3)
GFR calc Af Amer: 51 mL/min — ABNORMAL LOW (ref >60)
GFR calc non Af Amer: 44 mL/min — ABNORMAL LOW (ref >60)
Glucose, Bld: 314 mg/dL — ABNORMAL HIGH (ref 65–99)
Potassium: 3.9 mmol/L (ref 3.5–5.1)
SODIUM: 134 mmol/L — AB (ref 135–145)

## 2016-03-04 LAB — POC URINALSYSI DIPSTICK (AUTOMATED)
Bilirubin, UA: NEGATIVE
Glucose, UA: POSITIVE
KETONES UA: NEGATIVE
Leukocytes, UA: NEGATIVE
Nitrite, UA: NEGATIVE
PH UA: 6
PROTEIN UA: NEGATIVE
UROBILINOGEN UA: 1

## 2016-03-04 LAB — CBG MONITORING, ED
GLUCOSE-CAPILLARY: 312 mg/dL — AB (ref 65–99)
GLUCOSE-CAPILLARY: 244 mg/dL — AB (ref 65–99)

## 2016-03-04 MED ORDER — INSULIN ASPART 100 UNIT/ML ~~LOC~~ SOLN: 6.0000 [IU] | Freq: Once | SUBCUTANEOUS | Status: DC

## 2016-03-04 MED ORDER — ONETOUCH VERIO FLEX SYSTEM W/DEVICE KIT: 1.0000 | PACK | 0 refills | Status: DC

## 2016-03-04 MED ORDER — SODIUM CHLORIDE 0.9 % IV BOLUS (SEPSIS)
1000.0000 mL | Freq: Once | INTRAVENOUS | Status: AC
  Administered 2016-03-04: 1000 mL via INTRAVENOUS

## 2016-03-04 MED ORDER — ONETOUCH ULTRASOFT LANCETS MISC: 12 refills | Status: DC

## 2016-03-04 MED ORDER — GLUCOSE BLOOD VI STRP: ORAL_STRIP | 12 refills | Status: DC

## 2016-03-04 MED ORDER — SODIUM CHLORIDE 0.9 % IV BOLUS (SEPSIS): 500.0000 mL | Freq: Once | INTRAVENOUS | Status: DC

## 2016-03-04 MED ORDER — SODIUM CHLORIDE 0.9 % IV BOLUS (SEPSIS): 1000.0000 mL | Freq: Once | INTRAVENOUS | Status: DC

## 2016-03-04 NOTE — Discharge Instructions (Addendum)
It was our pleasure to provide your ER care today - we hope that you feel better.  Drink adequate water.   Follow diabetic eating plan.  Continue your diabetic medication.  Check sugars 4x/day and record values.   Contact your doctor and discuss whether they want to start any new/additional diabetes medication.  Follow up with your doctor in the coming week.  Return to ER if worse, new symptoms, fevers, persistent vomiting, weak/fainting, other concern.

## 2016-03-04 NOTE — Progress Notes (Signed)
Subjective:    Patient ID: Renee James, female    DOB: 09-Dec-1970, 46 y.o.   MRN: [id number]  HPI  Pt blood sugar yesterday am was 305 yesterday. Yesterday  464 at night.  Today 457 without any breakfast/fasting. On further discussion some blood sugars mid 500's on review of glucometer.  Pt ate small amount yesterday.   Pt is on metformin. Pt has been on some prednisone recently. She stopped taking the prednisone about one week ago.   Pt states she does feel thirsty. She is urinating a lot.   Has felt some dizziness and attributed to wellbutrin.  a1-c was 8.1 2 months ago.   Review of Systems  Constitutional: Negative for chills, diaphoresis and fatigue.  Respiratory: Negative for cough, chest tightness, shortness of breath and wheezing.   Cardiovascular: Negative for chest pain and palpitations.  Gastrointestinal: Negative for abdominal pain.  Endocrine: Positive for polydipsia and polyuria.  Musculoskeletal: Negative for back pain, joint swelling and neck pain.  Skin: Negative for rash.  Neurological: Positive for dizziness. Negative for syncope, speech difficulty, weakness and light-headedness.  Hematological: Negative for adenopathy. Does not bruise/bleed easily.    Past Medical History:  Diagnosis Date  . Allergy   . Anxiety   . Arthritis   . Asthma   . Bell's palsy   . Depression   . Diabetes mellitus without complication ([place])   . Hyperlipidemia   . Hypertension   . Pneumonia   . Renal insufficiency 05/13/2015     Social History   Social History  . Marital status: Married    Spouse name: N/A  . Number of children: N/A  . Years of education: N/A   Occupational History  . Not on file.   Social History Main Topics  . Smoking status: Current Every Day Smoker    Packs/day: 0.50    Types: Cigarettes  . Smokeless tobacco: Never Used  . Alcohol use Yes     Comment: occ  . Drug use: No  . Sexual activity: Yes    Birth control/ protection: None    Other Topics Concern  . Not on file   Social History Narrative  . No narrative on file    Past Surgical History:  Procedure Laterality Date  . ECTOPIC PREGNANCY SURGERY    . LAPAROSCOPY N/A 04/28/2015   Procedure: LAPAROSCOPY DIAGNOSTIC;  Surgeon: Donnamae Jude, MD;  Location: [facility];  Service: Gynecology;  Laterality: N/A;  . OVARIAN CYST REMOVAL    . TUBAL LIGATION      Family History  Problem Relation Age of Onset  . Diabetes Mother   . Heart disease Mother   . Kidney failure Mother   . Stroke Father   . Kidney failure Father   . Breast cancer      Allergies  Allergen Reactions  . Latex Hives, Shortness Of Breath and Itching  . Ace Inhibitors Cough  . 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 albuterol (PROVENTIL HFA;VENTOLIN HFA) 108 (90 Base) MCG/ACT inhaler Inhale 1-2 puffs into the lungs every 6 (six) hours as needed for wheezing or shortness of breath. 1 Inhaler 0  . benzonatate (TESSALON) 100 MG capsule Take 1 capsule (100 mg total) by mouth every 8 (eight) hours. 21 capsule 0  . buPROPion (WELLBUTRIN SR) 150 MG 12 hr tablet Take  1 tablet (150 mg total) by mouth 2 (two) times daily. 60 tablet 5  . hydrochlorothiazide (HYDRODIURIL) 25 MG tablet Take 1 tablet (25 mg total) by mouth daily. 90 tablet 3  . losartan (COZAAR) 100 MG tablet Take 1 tablet (100 mg total) by mouth daily. 30 tablet 11  . medroxyPROGESTERone (PROVERA) 10 MG tablet Take 2 tablets (20 mg total) by mouth daily. 60 tablet 2  . metFORMIN (GLUCOPHAGE-XR) 500 MG 24 hr tablet Take 4 tablets (2,000 mg total) by mouth daily. 120 tablet 3  . traMADol (ULTRAM) 50 MG tablet Take 1 tablet (50 mg total) by mouth at bedtime as needed. Use as needed for neck and back pain 30 tablet 1   No current facility-administered medications on file prior to visit.     BP 139/88 (BP  Location: Left Arm, Patient Position: Sitting, Cuff Size: Large)   Pulse 83   Temp 98.5 F (36.9 C) (Oral)   Resp 16   Ht 5\' 4"  (1.626 m)   Wt 184 lb (83.5 kg)   LMP 02/04/2016 (Exact Date)   SpO2 100%   BMI 31.58 kg/m       Objective:   Physical Exam  General Mental Status- Alert. General Appearance- Not in acute distress.   Skin General: Color- Normal Color. Moisture- Normal Moisture.  Neck Carotid Arteries- Normal color. Moisture- Normal Moisture. No carotid bruits. No JVD.  Chest and Lung Exam Auscultation: Breath Sounds:-Normal.  Cardiovascular Auscultation:Rythm- Regular. Murmurs & Other Heart Sounds:Auscultation of the heart reveals- No Murmurs.  Abdomen Inspection:-Inspeection Normal. Palpation/Percussion:Note:No mass. Palpation and Percussion of the abdomen reveal- Non Tender, Non Distended + BS, no rebound or guarding.    Neurologic Cranial Nerve exam:- CN III-XII intact(No nystagmus), symmetric smile. Strength:- 5/5 equal and symmetric strength both upper and lower extremities.      Assessment & Plan:  With your very high blood sugar readings over 400 and your symptoms I do think it would be best to be seen in the ED now. They will likely give IV hydration, get stat labs and insulin to bring down to safer level.   I have notified the ED dept of your recent presentation and update them that you are on your way. (Discussed case with PA in the ED)  Follow up early next week with pcp Dr. Lorelei Pont or with myself.  Berthel Bagnall, Percell Miller, PA-C

## 2016-03-04 NOTE — Patient Instructions (Addendum)
With your very high blood sugar readings over 400 and your symptoms I do think it would be best to be seen in the ED now. They will likely give IV hydration, get stat labs and insulin to bring down to safer level.   I have notified the ED dept of your recent presentation and update them that you are on your way.(Discussed case with PA in ED)  Follow up early next week with pcp Dr. Lorelei Pont or with myself.

## 2016-03-04 NOTE — ED Notes (Signed)
CBG 312. RN notified.

## 2016-03-04 NOTE — ED Triage Notes (Signed)
Pt sent from PCP re: hyperglycemia; pt sts glucose elevated x about 1 mo d/t some medication changes (hormone therapy and steroids)

## 2016-03-04 NOTE — Progress Notes (Signed)
Pre visit review using our clinic review tool, if applicable. No additional management support is needed unless otherwise documented below in the visit note/SLS  

## 2016-03-04 NOTE — ED Provider Notes (Signed)
[facility] Provider Note   CSN: [id number] Arrival date & time: 03/04/16  [id number]     History   Chief Complaint Chief Complaint  Patient presents with  . Hyperglycemia    HPI Renee James is a 46 y.o. female.  Patient w hx niddm, presents from pcp office indicating today blood sugar was in 400s.  States normally her glucose is in 150 range. Is on metformin and takes as directed.  Was recently put on brief course prednisone for uri/wheezing, but took last dose 4-5 days ago.  Patient denies polyuria or polydipsia. No nausea or vomiting. No fever or chills. Normal appetite. No wt change.    The history is provided by the patient.  Hyperglycemia  Associated symptoms: no abdominal pain, no chest pain, no confusion, no dysuria, no fever, no increased thirst, no polyuria, no shortness of breath and no vomiting     Past Medical History:  Diagnosis Date  . Allergy   . Anxiety   . Arthritis   . Asthma   . Bell's palsy   . Depression   . Diabetes mellitus without complication ([place])   . Hyperlipidemia   . Hypertension   . Pneumonia   . Renal insufficiency 05/13/2015    Patient Active Problem List   Diagnosis Date Noted  . Renal insufficiency 05/13/2015  . Ovarian torsion 04/28/2015  . Pain   . Hyperlipidemia 03/19/2015  . Obesity, unspecified 03/19/2015  . Controlled type 2 diabetes mellitus without complication, without long-term current use of insulin ([place]) 03/19/2015  . Essential hypertension 03/19/2015  . GAD (generalized anxiety disorder) 03/19/2015    Past Surgical History:  Procedure Laterality Date  . ECTOPIC PREGNANCY SURGERY    . LAPAROSCOPY N/A 04/28/2015   Procedure: LAPAROSCOPY DIAGNOSTIC;  Surgeon: Donnamae Jude, MD;  Location: [facility];  Service: Gynecology;  Laterality: N/A;  . OVARIAN CYST REMOVAL    . TUBAL LIGATION      OB History    Gravida Para Term Preterm AB Living   5       2 3    SAB TAB Ectopic Multiple Live Births     1 1            Home Medications    Prior to Admission medications   Medication Sig Start Date End Date Taking? Authorizing Provider  albuterol (PROVENTIL HFA;VENTOLIN HFA) 108 (90 Base) MCG/ACT inhaler Inhale 1-2 puffs into the lungs every 6 (six) hours as needed for wheezing or shortness of breath. 02/25/16   Kemper Durie Weaver, [place]  benzonatate (TESSALON) 100 MG capsule Take 1 capsule (100 mg total) by mouth every 8 (eight) hours. 02/23/16   Dorie Rank, MD  buPROPion [facility] SR) 150 MG 12 hr tablet Take 1 tablet (150 mg total) by mouth 2 (two) times daily. 12/14/15   Gay Filler Copland, MD  hydrochlorothiazide (HYDRODIURIL) 25 MG tablet Take 1 tablet (25 mg total) by mouth daily. 12/14/15   Gay Filler Copland, MD  losartan (COZAAR) 100 MG tablet Take 1 tablet (100 mg total) by mouth daily. 03/19/15   Darreld Mclean, MD  medroxyPROGESTERone (PROVERA) 10 MG tablet Take 2 tablets (20 mg total) by mouth daily. 02/17/16   Truett Mainland, DO  metFORMIN (GLUCOPHAGE-XR) 500 MG 24 hr tablet Take 4 tablets (2,000 mg total) by mouth daily. 01/18/16   Gay Filler Copland, MD  traMADol (ULTRAM) 50 MG tablet Take 1 tablet (50 mg total) by mouth at bedtime as needed. Use as needed  for neck and back pain 12/14/15   Darreld Mclean, MD    Family History Family History  Problem Relation Age of Onset  . Diabetes Mother   . Heart disease Mother   . Kidney failure Mother   . Stroke Father   . Kidney failure Father   . Breast cancer      Social History Social History  Substance Use Topics  . Smoking status: Current Every Day Smoker    Packs/day: 0.50    Types: Cigarettes  . Smokeless tobacco: Never Used  . Alcohol use Yes     Comment: occ     Allergies   Latex; Ace inhibitors; and Other   Review of Systems Review of Systems  Constitutional: Negative for fever.  HENT: Negative for sore throat.   Eyes: Negative for redness.  Respiratory: Negative for shortness of breath.   Cardiovascular:  Negative for chest pain.  Gastrointestinal: Negative for abdominal pain, diarrhea and vomiting.  Endocrine: Negative for polydipsia and polyuria.  Genitourinary: Negative for dysuria.  Musculoskeletal: Negative for back pain.  Skin: Negative for rash.  Neurological: Negative for headaches.  Hematological: Does not bruise/bleed easily.  Psychiatric/Behavioral: Negative for confusion.     Physical Exam Updated Vital Signs BP 144/92   Pulse 94   Temp 99.2 F (37.3 C) (Oral)   Resp 20   LMP 02/04/2016 (Exact Date)   SpO2 100%   Physical Exam  Constitutional: She appears well-developed and well-nourished. No distress.  HENT:  Mouth/Throat: Oropharynx is clear and moist.  Eyes: Conjunctivae are normal. No scleral icterus.  Neck: Neck supple. No tracheal deviation present.  Cardiovascular: Normal rate, regular rhythm, normal heart sounds and intact distal pulses.  Exam reveals no gallop and no friction rub.   No murmur heard. Pulmonary/Chest: Effort normal and breath sounds normal. No respiratory distress.  Abdominal: Soft. Normal appearance and bowel sounds are normal. She exhibits no distension. There is no tenderness.  Genitourinary:  Genitourinary Comments: No cva tenderness  Musculoskeletal: She exhibits no edema.  Neurological: She is alert.  Skin: Skin is warm and dry. No rash noted.  Psychiatric: She has a normal mood and affect.  Nursing note and vitals reviewed.    ED Treatments / Results  Labs (all labs ordered are listed, but only abnormal results are displayed) Results for orders placed or performed during the hospital encounter of 0000000  Basic metabolic panel  Result Value Ref Range   Sodium 134 (L) 135 - 145 mmol/L   Potassium 3.9 3.5 - 5.1 mmol/L   Chloride 105 101 - 111 mmol/L   CO2 20 (L) 22 - 32 mmol/L   Glucose, Bld 314 (H) 65 - 99 mg/dL   BUN 15 6 - 20 mg/dL   Creatinine, Ser 1.39 (H) 0.44 - 1.00 mg/dL   Calcium 9.1 8.9 - 10.3 mg/dL   GFR calc  non Af Amer 44 (L) >60 mL/min   GFR calc Af Amer 51 (L) >60 mL/min   Anion gap 9 5 - 15  CBG monitoring, ED  Result Value Ref Range   Glucose-Capillary 312 (H) 65 - 99 mg/dL   Comment 1 Notify RN    Comment 2 Document in Chart   POC CBG, ED  Result Value Ref Range   Glucose-Capillary 244 (H) 65 - 99 mg/dL    EKG  EKG Interpretation None       Radiology No results found.  Procedures Procedures (including critical care time)  Medications Ordered in  ED Medications  sodium chloride 0.9 % bolus 1,000 mL (not administered)     Initial Impression / Assessment and Plan / ED Course  I have reviewed the triage vital signs and the nursing notes.  Pertinent labs & imaging results that were available during my care of the patient were reviewed by me and considered in my medical decision making (see chart for details).  Iv ns bolus. Labs.   Reviewed nursing notes and prior charts for additional history.   Glucose improved, 243.  Pt drinking fluids well.  Pt currently appears stable for d/c.     Final Clinical Impressions(s) / ED Diagnoses   Final diagnoses:  None    New Prescriptions New Prescriptions   No medications on file     Lajean Saver, MD 03/04/16 1141

## 2016-03-07 ENCOUNTER — Telehealth: Payer: Self-pay

## 2016-03-07 ENCOUNTER — Ambulatory Visit (INDEPENDENT_AMBULATORY_CARE_PROVIDER_SITE_OTHER): Payer: Managed Care, Other (non HMO) | Admitting: Medical

## 2016-03-07 ENCOUNTER — Encounter: Payer: Self-pay | Admitting: Medical

## 2016-03-07 VITALS — BP 120/84 | HR 80 | Temp 98.0°F | Ht 64.0 in | Wt 186.4 lb

## 2016-03-07 DIAGNOSIS — E1165 Type 2 diabetes mellitus with hyperglycemia: Secondary | ICD-10-CM

## 2016-03-07 LAB — GLUCOSE, POCT (MANUAL RESULT ENTRY): POC GLUCOSE: 300 mg/dL — AB (ref 70–99)

## 2016-03-07 MED ORDER — ONETOUCH ULTRASOFT LANCETS MISC: 12 refills | Status: AC

## 2016-03-07 MED ORDER — INSULIN GLARGINE 100 UNIT/ML SOLOSTAR PEN: PEN_INJECTOR | SUBCUTANEOUS | 99 refills | Status: DC

## 2016-03-07 MED ORDER — INSULIN DETEMIR 100 UNIT/ML FLEXPEN: 10.0000 [IU] | PEN_INJECTOR | Freq: Every day | SUBCUTANEOUS | 99 refills | Status: DC

## 2016-03-07 NOTE — Telephone Encounter (Signed)
Received call from pharmacy stating that patient's insurance does not cover lantus.  They will accept a PA, but would prefer patient be started on one of the following: Tresiba, Levemir, or Engineer, agricultural.    Please advise.

## 2016-03-07 NOTE — Telephone Encounter (Signed)
Spoke with Renee James.  Verbal order given for Levemir with exact same directions.  Called pharmacy back. Spoke to [place] and instructions given.  Nothing else needed at this time.   Lantus removed from med list.  Levemir added.

## 2016-03-07 NOTE — Progress Notes (Signed)
Subjective:    Patient ID: Renee James, female    DOB: [birth year], 46 y.o.   MRN: [id number]  HPI  Pt in for follow up.   Pt states her sugars were high this weekend. They were in the 300-400 range this weekend despite not eating much at all.  Pt stopped taking provera. She thought this was making her sugars increase. She has been off of  prednisone for a week and still sugars are elevated.  Pt is on metformin xr 4 tabs a day.  Pt is on cozaar for her bp.  Pt does admit feeling tired.   Pt went to ED this past Friday after I saw her on Friday and advised ED evaluation. She was give iv fluids and got stat labs.Pt stated ED recommended second bag iv fluids and insulin on Friday but she declined.  Pt sugar today was 303. Fasting am today was 204.  Yesterday afternoon sugar was 396 after very light breakfast.(overall majority of her reading in 300 range despite not eating much)  Overall about last 10 days have been running in 300's.  In December pt a1-c was 8.1.   Metformin use since she was 46 yo.  Pt in past triced trulicity, [place] and glipizide. Abdomen pain with trulicity. With glipizide sugars she had low blood sugars. Januvia made her cough.    Review of Systems  Constitutional: Positive for fatigue. Negative for chills and fever.  HENT: Negative for congestion and drooling.   Respiratory: Negative for cough, chest tightness, shortness of breath and wheezing.   Cardiovascular: Negative for chest pain and palpitations.  Gastrointestinal: Negative for abdominal pain and anal bleeding.  Endocrine: Positive for polydipsia. Negative for polyphagia and polyuria.       Some dry mouth.  Musculoskeletal: Negative for back pain.  Skin: Negative for rash.  Neurological: Negative for dizziness, syncope, weakness, light-headedness and numbness.  Hematological: Negative for adenopathy. Does not bruise/bleed easily.  Psychiatric/Behavioral: Negative for behavioral problems and  confusion.    Past Medical History:  Diagnosis Date  . Allergy   . Anxiety   . Arthritis   . Asthma   . Bell's palsy   . Depression   . Diabetes mellitus without complication ([place])   . Hyperlipidemia   . Hypertension   . Pneumonia   . Renal insufficiency 05/13/2015     Social History   Social History  . Marital status: Married    Spouse name: N/A  . Number of children: N/A  . Years of education: N/A   Occupational History  . Not on file.   Social History Main Topics  . Smoking status: Current Every Day Smoker    Packs/day: 0.50    Types: Cigarettes  . Smokeless tobacco: Never Used  . Alcohol use Yes     Comment: occ  . Drug use: No  . Sexual activity: Yes    Birth control/ protection: None   Other Topics Concern  . Not on file   Social History Narrative  . No narrative on file    Past Surgical History:  Procedure Laterality Date  . ECTOPIC PREGNANCY SURGERY    . LAPAROSCOPY N/A 04/28/2015   Procedure: LAPAROSCOPY DIAGNOSTIC;  Surgeon: Donnamae Jude, MD;  Location: [facility];  Service: Gynecology;  Laterality: N/A;  . OVARIAN CYST REMOVAL    . TUBAL LIGATION      Family History  Problem Relation Age of Onset  . Diabetes Mother   . Heart disease Mother   .  Kidney failure Mother   . Stroke Father   . Kidney failure Father   . Breast cancer      Allergies  Allergen Reactions  . Latex Hives, Shortness Of Breath and Itching  . Ace Inhibitors Cough  . 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 albuterol (PROVENTIL HFA;VENTOLIN HFA) 108 (90 Base) MCG/ACT inhaler Inhale 1-2 puffs into the lungs every 6 (six) hours as needed for wheezing or shortness of breath. 1 Inhaler 0  . benzonatate (TESSALON) 100 MG capsule Take 1 capsule (100 mg total) by mouth every 8 (eight) hours. 21 capsule 0  . Blood Glucose Monitoring Suppl  ([place]) w/Device KIT 1 kit by Does not apply route as directed. USE AS DIRECTED TO TEST BLOOD SUGARS DAILY DX: E11.9 1 kit 0  . buPROPion (WELLBUTRIN SR) 150 MG 12 hr tablet Take 1 tablet (150 mg total) by mouth 2 (two) times daily. 60 tablet 5  . glucose blood (ONETOUCH VERIO) test strip USE AS DIRECTED TO TEST BLOOD SUGARS DAILY DX: E11.9 100 each 12  . hydrochlorothiazide (HYDRODIURIL) 25 MG tablet Take 1 tablet (25 mg total) by mouth daily. 90 tablet 3  . Lancets (ONETOUCH ULTRASOFT) lancets USE AS DIRECTED TO TEST BLOOD SUGARS DAILY DX: E11.9 100 each 12  . losartan (COZAAR) 100 MG tablet Take 1 tablet (100 mg total) by mouth daily. 30 tablet 11  . metFORMIN (GLUCOPHAGE-XR) 500 MG 24 hr tablet Take 4 tablets (2,000 mg total) by mouth daily. 120 tablet 3  . traMADol (ULTRAM) 50 MG tablet Take 1 tablet (50 mg total) by mouth at bedtime as needed. Use as needed for neck and back pain 30 tablet 1  . medroxyPROGESTERone (PROVERA) 10 MG tablet Take 2 tablets (20 mg total) by mouth daily. (Patient not taking: Reported on 03/07/2016) 60 tablet 2   No current facility-administered medications on file prior to visit.     BP 120/84 (BP Location: Right Arm, Patient Position: Sitting, Cuff Size: Normal)   Pulse 80   Temp 98 F (36.7 C) (Oral)   Ht _0  (1.626 m)   Wt 186 lb 6.4 oz (84.6 kg)   LMP 03/07/2016   SpO2 98%   BMI 32.00 kg/m       Objective:   Physical Exam  General Mental Status- Alert. General Appearance- Not in acute distress.   Skin General: Color- Normal Color. Moisture- Normal Moisture.  Neck Carotid Arteries- Normal color. Moisture- Normal Moisture. No carotid bruits. No JVD.  Chest and Lung Exam Auscultation: Breath Sounds:-Normal.  Cardiovascular Auscultation:Rythm- Regular. Murmurs & Other Heart Sounds:Auscultation of the heart reveals- No Murmurs.  Abdomen Inspection:-Inspeection Normal. Palpation/Percussion:Note:No mass. Palpation and  Percussion of the abdomen reveal- Non Tender, Non Distended + BS, no rebound or guarding.    Neurologic Cranial Nerve exam:- CN III-XII intact(No nystagmus), symmetric smile. Strength:- 5/5 equal and symmetric strength both upper and lower extremities.      Assessment & Plan:  Regarding your very high blood sugars (severely poorly controlled) recently  I did talk with Dr. Lorelei Pont and we will continue the metformin and add lantus to your regimen. Please start this tonight. Continue low sugar diet and try to walk some daily.   Follow up with Dr.Copland in 10-14 days.   Discussed measures to bring sugar up if sugar  drop less than 70. But doubt she will experience low sugar.   Kamorie Aldous, Percell Miller, PA-C

## 2016-03-07 NOTE — Progress Notes (Signed)
Pre visit review using our clinic review tool, if applicable. No additional management support is needed unless otherwise documented below in the visit note. 

## 2016-03-07 NOTE — Patient Instructions (Addendum)
Regarding your very high blood sugars(severely poorly controlled) recently I did talk with Dr. Lorelei Pont and we will continue the metfomin and add lantus to your regimen. Please start this tonight. Continue low sugar diet and try to walk some daily.   Follow up with Dr.Copland in 10-14 days.

## 2016-03-10 ENCOUNTER — Ambulatory Visit: Payer: Managed Care, Other (non HMO) | Admitting: Family Medicine

## 2016-03-14 ENCOUNTER — Telehealth: Payer: Self-pay | Admitting: Family Medicine

## 2016-03-14 DIAGNOSIS — E1165 Type 2 diabetes mellitus with hyperglycemia: Principal | ICD-10-CM

## 2016-03-14 DIAGNOSIS — IMO0001 Reserved for inherently not codable concepts without codable children: Secondary | ICD-10-CM

## 2016-03-14 NOTE — Telephone Encounter (Signed)
Caller name: Relationship to patient: Self Can be reached: [phone]  Pharmacy:  Reason for call: Request refill to [facility] in [facility]

## 2016-04-13 ENCOUNTER — Other Ambulatory Visit: Payer: Self-pay | Admitting: Emergency Medicine

## 2016-04-13 ENCOUNTER — Telehealth: Payer: Self-pay | Admitting: Family Medicine

## 2016-04-13 DIAGNOSIS — I10 Essential (primary) hypertension: Secondary | ICD-10-CM

## 2016-04-13 MED ORDER — LOSARTAN POTASSIUM 100 MG PO TABS: 100.0000 mg | ORAL_TABLET | Freq: Every day | ORAL | 11 refills | Status: DC

## 2016-04-13 MED ORDER — GLUCOSE BLOOD VI STRP: ORAL_STRIP | 12 refills | Status: DC

## 2016-04-13 NOTE — Telephone Encounter (Signed)
Sent per pt request.  

## 2016-04-13 NOTE — Telephone Encounter (Signed)
Caller name: Relationship to patient:Self Can be reached: [phone]  Pharmacy:  [place], [place] - [address] [phone] (Phone) [phone] (Fax)     Reason for call: Refill losartan (COZAAR) 100 MG tablet  glucose blood (ONETOUCH VERIO) test strip [[id number]]

## 2016-05-16 ENCOUNTER — Encounter: Payer: Self-pay | Admitting: Family Medicine

## 2016-05-16 DIAGNOSIS — N183 Chronic kidney disease, stage 3 unspecified: Secondary | ICD-10-CM | POA: Insufficient documentation

## 2016-07-08 ENCOUNTER — Other Ambulatory Visit: Payer: Self-pay | Admitting: Family Medicine

## 2016-07-08 DIAGNOSIS — G8929 Other chronic pain: Secondary | ICD-10-CM

## 2016-07-08 DIAGNOSIS — M79643 Pain in unspecified hand: Principal | ICD-10-CM

## 2016-07-11 ENCOUNTER — Other Ambulatory Visit: Payer: Self-pay | Admitting: Emergency Medicine

## 2016-07-11 DIAGNOSIS — G8929 Other chronic pain: Secondary | ICD-10-CM

## 2016-07-11 DIAGNOSIS — M79643 Pain in unspecified hand: Principal | ICD-10-CM

## 2016-07-11 MED ORDER — TRAMADOL HCL 50 MG PO TABS: 50.0000 mg | ORAL_TABLET | Freq: Every evening | ORAL | 1 refills | Status: DC | PRN

## 2016-07-11 NOTE — Telephone Encounter (Signed)
Requesting: traMADol (ULTRAM) 50 MG tablet Contract UDS Last OV: 12/11//17 Last Refill : 12/14/15  Please Advise

## 2016-07-11 NOTE — Telephone Encounter (Signed)
NCCSR:  She got tramadol from me (30) on 4/5 and 2/7- no other entries Will refill for her today

## 2016-07-15 ENCOUNTER — Other Ambulatory Visit: Payer: Self-pay | Admitting: Family Medicine

## 2016-07-15 ENCOUNTER — Other Ambulatory Visit: Payer: Self-pay | Admitting: Emergency Medicine

## 2016-07-15 MED ORDER — METFORMIN HCL ER 500 MG PO TB24: 2000.0000 mg | ORAL_TABLET | Freq: Every day | ORAL | 0 refills | Status: AC

## 2016-07-15 NOTE — Telephone Encounter (Signed)
Caller name: Shirlean Berman Relationship to patient: self Can be reached: [phone] Pharmacy: [facility]  Reason for call: pt called stating she needs metformin sent to pharmacy. She is out and going out of town tonight. Advised pt to give 3 days notice on med refills in the future. Pt states she is on the way to [place] now. I advised it will probably not be ready when she gets there with short notice.

## 2016-07-15 NOTE — Telephone Encounter (Signed)
Refill sent to pharmacy. Per pt request.

## 2016-08-19 ENCOUNTER — Emergency Department (HOSPITAL_BASED_OUTPATIENT_CLINIC_OR_DEPARTMENT_OTHER)
Admission: EM | Admit: 2016-08-19 | Discharge: 2016-08-19 | Disposition: A | Discharge status: Home / Self Care | Payer: Managed Care, Other (non HMO) | Attending: Emergency Medicine | Admitting: Emergency Medicine

## 2016-08-19 ENCOUNTER — Encounter (HOSPITAL_BASED_OUTPATIENT_CLINIC_OR_DEPARTMENT_OTHER): Payer: Self-pay | Admitting: *Deleted

## 2016-08-19 DIAGNOSIS — F17210 Nicotine dependence, cigarettes, uncomplicated: Secondary | ICD-10-CM | POA: Insufficient documentation

## 2016-08-19 DIAGNOSIS — Z79899 Other long term (current) drug therapy: Secondary | ICD-10-CM | POA: Diagnosis not present

## 2016-08-19 DIAGNOSIS — I129 Hypertensive chronic kidney disease with stage 1 through stage 4 chronic kidney disease, or unspecified chronic kidney disease: Secondary | ICD-10-CM | POA: Insufficient documentation

## 2016-08-19 DIAGNOSIS — G5773 Causalgia of bilateral lower limbs: Secondary | ICD-10-CM | POA: Diagnosis present

## 2016-08-19 DIAGNOSIS — E1122 Type 2 diabetes mellitus with diabetic chronic kidney disease: Secondary | ICD-10-CM | POA: Diagnosis not present

## 2016-08-19 DIAGNOSIS — Z91040 Latex allergy status: Secondary | ICD-10-CM | POA: Insufficient documentation

## 2016-08-19 DIAGNOSIS — Z794 Long term (current) use of insulin: Secondary | ICD-10-CM | POA: Insufficient documentation

## 2016-08-19 DIAGNOSIS — E1140 Type 2 diabetes mellitus with diabetic neuropathy, unspecified: Secondary | ICD-10-CM | POA: Diagnosis not present

## 2016-08-19 DIAGNOSIS — N183 Chronic kidney disease, stage 3 (moderate): Secondary | ICD-10-CM | POA: Diagnosis not present

## 2016-08-19 DIAGNOSIS — J45909 Unspecified asthma, uncomplicated: Secondary | ICD-10-CM | POA: Insufficient documentation

## 2016-08-19 HISTORY — DX: Type 2 diabetes mellitus with diabetic neuropathy, unspecified: E11.40

## 2016-08-19 HISTORY — DX: Chronic kidney disease, stage 3 (moderate): N18.3

## 2016-08-19 HISTORY — DX: Chronic kidney disease, stage 3 unspecified: N18.30

## 2016-08-19 NOTE — ED Provider Notes (Signed)
Pt seen and evaluated. D/W dr. Shawna Orleans. Pr tiwh progressive diabetic Neuropathy. Poor controlled DM with last A1C of 8. Normal exam with + pulses, strong CRF.  Recommend f/u with PCP re ongoing care.   Tanna Furry, MD 08/19/16 1029

## 2016-08-19 NOTE — ED Provider Notes (Signed)
[facility] DEPT [facility] Provider Note   CSN: [id number] Arrival date & time: 08/19/16  5053     History   Chief Complaint Chief Complaint  Patient presents with  . Leg Pain    HPI Bellina Tokarczyk is a 46 y.o. female.   Patient is a 46 year old female with a history of diabetes and diabetic neuropathy who presents with bilateral leg pain. States has neuropathy at baseline with a tingling or burning sensation. Has been having worsening pain in both legs over the last 2 days, characterized as sharp and stabbing, tender to touch, having difficulty ambulating from the pain. No swelling or redness. Follows with endocrinology, has been taking gabapentin which she took last night and recently started on metanx 1 week ago. No weakness, numbness. She is here because her family member recommended she be evaluated for blood clots given her leg pain.      Past Medical History:  Diagnosis Date  . Allergy   . Anxiety   . Arthritis   . Asthma   . Bell's palsy   . Chronic kidney disease (CKD), stage III (moderate)   . Depression   . Diabetes mellitus without complication ([place])   . Diabetic neuropathy ([place])   . Hyperlipidemia   . Hypertension   . Pneumonia   . Renal insufficiency 05/13/2015    Patient Active Problem List   Diagnosis Date Noted  . CKD (chronic kidney disease) stage 3, GFR 30-59 ml/min 05/16/2016  . Renal insufficiency 05/13/2015  . Ovarian torsion 04/28/2015  . Pain   . Hyperlipidemia 03/19/2015  . Obesity, unspecified 03/19/2015  . Controlled type 2 diabetes mellitus without complication, without long-term current use of insulin ([place]) 03/19/2015  . Essential hypertension 03/19/2015  . GAD (generalized anxiety disorder) 03/19/2015    Past Surgical History:  Procedure Laterality Date  . ECTOPIC PREGNANCY SURGERY    . LAPAROSCOPY N/A 04/28/2015   Procedure: LAPAROSCOPY DIAGNOSTIC;  Surgeon: Donnamae Jude, MD;  Location: [facility];  Service: Gynecology;  Laterality: N/A;   . OVARIAN CYST REMOVAL    . TUBAL LIGATION      OB History    Gravida Para Term Preterm AB Living   '5       2 3   '$ SAB TAB Ectopic Multiple Live Births     1 1           Home Medications    Prior to Admission medications   Medication Sig Start Date End Date Taking? Authorizing Provider  Blood Glucose Monitoring Suppl ([place]) w/Device KIT 1 kit by Does not apply route as directed. USE AS DIRECTED TO TEST BLOOD SUGARS DAILY DX: E11.9 03/04/16  Yes Saguier, Percell Miller, PA-C  glucose blood (ONETOUCH VERIO) test strip USE AS DIRECTED TO TEST BLOOD SUGARS DAILY DX: E11.9 04/13/16  Yes Copland, Gay Filler, MD  hydrochlorothiazide (HYDRODIURIL) 25 MG tablet Take 1 tablet (25 mg total) by mouth daily. 12/14/15  Yes Copland, Gay Filler, MD  Insulin Detemir (LEVEMIR) 100 UNIT/ML Pen Inject 10 Units into the skin daily at 10 pm. Check your morning blood sugar every day. If morning blood sugars aren't at goal (100), then increase the amount of evening levemir insulin by 1 unit every night until fasting blood sugars  goal of less than 100 is reached. 03/07/16  Yes Saguier, Percell Miller, PA-C  l-methylfolate-B6-B12 (METANX) 3-35-2 MG TABS tablet Take 1 tablet by mouth 2 (two) times daily.   Yes [provider]  Lancets (ONETOUCH ULTRASOFT)  lancets USE AS DIRECTED TO TEST BLOOD SUGARS DAILY DX: E11.9 03/07/16  Yes Saguier, Percell Miller, PA-C  losartan (COZAAR) 100 MG tablet Take 1 tablet (100 mg total) by mouth daily. 04/13/16  Yes Copland, Gay Filler, MD  metFORMIN (GLUCOPHAGE-XR) 500 MG 24 hr tablet Take 4 tablets (2,000 mg total) by mouth daily. 07/15/16  Yes Copland, Gay Filler, MD  pravastatin (PRAVACHOL) 10 MG tablet Take 10 mg by mouth daily.   Yes [provider]  traMADol (ULTRAM) 50 MG tablet Take 1 tablet (50 mg total) by mouth at bedtime as needed. Use as needed for neck and back pain 07/11/16  Yes Copland, Gay Filler, MD  albuterol (PROVENTIL HFA;VENTOLIN HFA) 108 (90 Base) MCG/ACT  inhaler Inhale 1-2 puffs into the lungs every 6 (six) hours as needed for wheezing or shortness of breath. 02/25/16   Espina, Graball, PA  benzonatate (TESSALON) 100 MG capsule Take 1 capsule (100 mg total) by mouth every 8 (eight) hours. 02/23/16   Dorie Rank, MD  buPROPion [facility] SR) 150 MG 12 hr tablet Take 1 tablet (150 mg total) by mouth 2 (two) times daily. 12/14/15   Copland, Gay Filler, MD  medroxyPROGESTERone (PROVERA) 10 MG tablet Take 2 tablets (20 mg total) by mouth daily. Patient not taking: Reported on 03/07/2016 02/17/16   Truett Mainland, DO    Family History Family History  Problem Relation Age of Onset  . Diabetes Mother   . Heart disease Mother   . Kidney failure Mother   . Stroke Father   . Kidney failure Father   . Breast cancer Unknown     Social History Social History  Substance Use Topics  . Smoking status: Current Every Day Smoker    Packs/day: 0.50    Types: Cigarettes  . Smokeless tobacco: Never Used  . Alcohol use Yes     Comment: occ     Allergies   Latex; Ace inhibitors; and Other   Review of Systems Review of Systems  Constitutional: Negative for chills and fever.  Respiratory: Negative for chest tightness and shortness of breath.   Cardiovascular: Negative for chest pain and leg swelling.  Gastrointestinal: Negative for abdominal pain, nausea and vomiting.  Musculoskeletal: Negative for joint swelling.       Bilateral leg pain  Skin: Negative for rash.  Neurological: Negative for weakness and numbness.     Physical Exam Updated Vital Signs BP 125/81 (BP Location: Left Arm)   Pulse 72   Temp 98.4 F (36.9 C) (Oral)   Resp 18   Ht '5\' 3"'$  (1.6 m)   Wt 87.1 kg (192 lb)   LMP 08/05/2016 (Approximate)   SpO2 99%   BMI 34.01 kg/m   Physical Exam  Constitutional: She is oriented to person, place, and time. She appears well-developed and well-nourished. No distress.  HENT:  Head: Normocephalic and atraumatic.  Nose: Nose  normal.  Eyes: Conjunctivae and EOM are normal.  Neck: Normal range of motion.  Cardiovascular: Normal rate, regular rhythm, normal heart sounds and intact distal pulses.   No murmur heard. Pulmonary/Chest: Effort normal and breath sounds normal. No respiratory distress. She has no wheezes.  Abdominal: Soft. Bowel sounds are normal. She exhibits no distension. There is no tenderness. There is no rebound and no guarding.  Musculoskeletal: Normal range of motion. She exhibits tenderness (TTP diffusely on b/l lower extremities). She exhibits no edema or deformity.  Neg homans b/l. No erythema  Neurological: She is alert and oriented to  person, place, and time. She exhibits normal muscle tone. Coordination normal.  Ambulates well without assistance  Skin: Skin is warm and dry. Capillary refill takes less than 2 seconds. No rash noted. No erythema.  Psychiatric: She has a normal mood and affect.     ED Treatments / Results  Labs (all labs ordered are listed, but only abnormal results are displayed) Labs Reviewed - No data to display  EKG  EKG Interpretation None       Radiology No results found.  Procedures Procedures (including critical care time)  Medications Ordered in ED Medications - No data to display   Initial Impression / Assessment and Plan / ED Course  I have reviewed the triage vital signs and the nursing notes.  Pertinent labs & imaging results that were available during my care of the patient were reviewed by me and considered in my medical decision making (see chart for details).   Patient is a 46 yo F with diabetic neuropathy who presented to the ED with worsening b/l leg pain. She denies SOB, CP currently. On exam there is no edema or erythema and neg Homans and no palpable cord on exam. Likely worsening diabetic neuropathy from patient being very active at work. Reassured patient that very low concern for DVT, advised follow up with endocrinologist as  outpatient.   Final Clinical Impressions(s) / ED Diagnoses   Final diagnoses:  Diabetic neuropathy, painful [facility])    New Prescriptions New Prescriptions   No medications on file     Bufford Lope, DO 08/19/16 1027    Tanna Furry, MD 08/27/16 [phone]

## 2016-08-19 NOTE — ED Triage Notes (Signed)
Pt states she has a hx of diabetic neuropathy and has chronic numbness, burning, and tingling. She was started on a new medication one week ago "metanx". Since yesterday she states she is having sharp, shooting pain in both legs and the bottom of her feet.

## 2016-08-19 NOTE — Discharge Instructions (Signed)
You were evaluated in the ER today for worsening leg pain that is likely due to your chronic neuropathy. Please follow up with your endocrinologist about your concerns.

## 2016-08-28 NOTE — Progress Notes (Signed)
[facility] at [facility] [address], [place], [place] [address] [phone]  Date:  08/29/2016   Name:  Renee James   DOB:  05-04-1970   MRN:  [id number]  PCP:  Darreld Mclean, MD    Chief Complaint: Back Pain (Disability)   History of Present Illness:  Renee James is a 46 y.o. very pleasant female patient who presents with the following:  Here today for an ER follow-up She was in the ER about 10 days ago with concern of blood clots due to leg pain.  She was evaluated and released to home:   Patient is a 46 yo F with diabetic neuropathy who presented to the ED with worsening b/l leg pain. She denies SOB, CP currently. On exam there is no edema or erythema and neg Homans and no palpable cord on exam. Likely worsening diabetic neuropathy from patient being very active at work. Reassured patient that very low concern for DVT, advised follow up with endocrinologist as outpatient.  Lab Results  Component Value Date   HGBA1C 8.1 (H) 12/14/2015   I last saw her in the office in December:  Here today to discuss her diabetes mellitus, chronic renal insuf, hyperlipidemia, HTN, depression She is on losartan and HCTZ- ran out of her HCTZ a couple of days ago which is probably why her BP is up slightly today  She has DM- last A1c in July was 7.5%    BP Readings from Last 3 Encounters:  12/14/15 (!) 145/82  10/12/15 159/91  09/10/15 140/70   She needs her A1c checked today, last cholesterol was about 9 months ago  She has been on celexa for about 12 years.  She is on 40 mg. She has felt like it was not working as well as it had in the past for "a few months."   She notes difficulty falling asleep at night, but she is tired during the day also Her energy level is low.  appetite is "pretty good."   No SI She feels more irritable and cries easily at small things.   She notes a lot of stressors- her job is unpleasant, her SIL  who was a close friend passed away last month from liver cancer.  Also 2 of her children are currently in prison.   She feels like her muscle spasms have been worse recently. "whenever I get upset my neck will stiffen up."    Her menses are somewhat irregular at this time - early menopause runs in her family, she wonders if she is going through this change.  S/p BTL  She uses tramadol some as well for her back pains.   She notes that the muscles in her neck and back are "hard all the time." She has not treid any other treatment such PT or massage therapy as of yet Declines a flu shot, but is willing to get a pneumonia shot today  She is on metformin 1,000 BID  Discussed options for treating her depression.  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  We think this is why her legs were hurting so much last week Posey Pronto started her on some lyrica 75 mg a day as well as increasing her gabapentin dosage for her feet; however,  she is supposed to take the lyrica in the morning and is afraid it will make her too sleepy to drive so she is not using it.   He also suggested a sleep study for her to rule out OSA,  She does snoore.  Will set this up for her today   Today she notes that "my back is hurting really bad," and she needs some medication refills.  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 Olivia Mackie  She needs her albuterol refilled today-she will get SOB with exertion, and albuterol does seem to help her some She does not generally have any chest pain, but feels like her exercise tolerance has gone down recently  She has not had a cardiac stress test that she can recall She is  not aware of any history of cardiac disease   She stopped using wellbutrin as it seemed to cause her to have ?tinnitus She stopped using the wellbutrin about 8 months ago  She notes a burning sensation in her right lower back- this has been present for about 10 days, and may get worse at night Lat night she tried a warm compress which did help   No recent back films in chart  LMP 08/24/16  She was on celexa for many years in the past- she feels like this worked ok for her.  However we tried wellbutrin in hopes that it would help her quit smoking.   She is feeling overwhelmed and down right now.  Would like to go back on her celexa No SI  NCCSR: she got 30 tramadol in April and in July    Patient Active Problem List   Diagnosis Date Noted  . CKD (chronic kidney disease) stage 3, GFR 30-59 ml/min 05/16/2016  . Renal insufficiency 05/13/2015  . Ovarian torsion 04/28/2015  . Pain   . Hyperlipidemia 03/19/2015  . Obesity, unspecified 03/19/2015  . Controlled type 2 diabetes mellitus without complication, without long-term current use of insulin ([place]) 03/19/2015  . Essential hypertension 03/19/2015  . GAD (generalized anxiety disorder) 03/19/2015    Past Medical History:  Diagnosis Date  . Allergy   . Anxiety   . Arthritis   . Asthma   . Bell's palsy   . Chronic kidney disease (CKD), stage III (moderate)   . Depression   . Diabetes mellitus without complication ([place])   . Diabetic neuropathy ([place])   . Hyperlipidemia   . Hypertension   . Pneumonia   . Renal insufficiency 05/13/2015    Past Surgical History:  Procedure Laterality Date  . ECTOPIC PREGNANCY SURGERY    . LAPAROSCOPY N/A 04/28/2015   Procedure: LAPAROSCOPY DIAGNOSTIC;  Surgeon: Donnamae Jude, MD;  Location: [facility];  Service: Gynecology;  Laterality: N/A;  . OVARIAN CYST REMOVAL    . TUBAL LIGATION      Social History  Substance Use Topics  . Smoking status: Current Every Day Smoker    Packs/day: 0.50     Types: Cigarettes  . Smokeless tobacco: Never Used  . Alcohol use Yes     Comment: occ    Family History  Problem Relation Age of Onset  . Diabetes Mother   . Heart disease Mother   . Kidney failure Mother   . Stroke Father   . Kidney failure Father   .  Breast cancer Unknown     Allergies  Allergen Reactions  . Latex Hives, Shortness Of Breath and Itching  . Ace Inhibitors Cough  . 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  Current Outpatient Prescriptions on File Prior to Visit  Medication Sig Dispense Refill  . Blood Glucose Monitoring Suppl ([place]) w/Device KIT 1 kit by Does not apply route as directed. USE AS DIRECTED TO TEST BLOOD SUGARS DAILY DX: E11.9 1 kit 0  . glucose blood (ONETOUCH VERIO) test strip USE AS DIRECTED TO TEST BLOOD SUGARS DAILY DX: E11.9 100 each 12  . hydrochlorothiazide (HYDRODIURIL) 25 MG tablet Take 1 tablet (25 mg total) by mouth daily. 90 tablet 3  . Insulin Detemir (LEVEMIR) 100 UNIT/ML Pen Inject 10 Units into the skin daily at 10 pm. Check your morning blood sugar every day. If morning blood sugars aren't at goal (100), then increase the amount of evening levemir insulin by 1 unit every night until fasting blood sugars  goal of less than 100 is reached. 5 pen PRN  . l-methylfolate-B6-B12 (METANX) 3-35-2 MG TABS tablet Take 1 tablet by mouth 2 (two) times daily.    . Lancets (ONETOUCH ULTRASOFT) lancets USE AS DIRECTED TO TEST BLOOD SUGARS DAILY DX: E11.9 100 each 12  . losartan (COZAAR) 100 MG tablet Take 1 tablet (100 mg total) by mouth daily. 30 tablet 11  . metFORMIN (GLUCOPHAGE-XR) 500 MG 24 hr tablet Take 4 tablets (2,000 mg total) by mouth daily. 120 tablet 0  . pravastatin (PRAVACHOL) 10 MG tablet Take 10 mg by mouth daily.    . medroxyPROGESTERone (PROVERA) 10 MG tablet Take 2 tablets (20 mg total) by mouth daily. (Patient not  taking: Reported on 08/29/2016) 60 tablet 2   No current facility-administered medications on file prior to visit.     Review of Systems:  As per HPI- otherwise negative.   Physical Examination: Vitals:   08/29/16 1115  Pulse: 76  Temp: 98.8 F (37.1 C)  SpO2: 100%   Vitals:   08/29/16 1115  Weight: 190 lb 6.4 oz (86.4 kg)  Height: _0  (1.626 m)   Body mass index is 32.68 kg/m. Ideal Body Weight: Weight in (lb) to have BMI = 25: 145.3  GEN: WDWN, NAD, Non-toxic, A & O x 3, obese, otherwise looks well HEENT: Atraumatic, Normocephalic. Neck supple. No masses, No LAD. Ears and Nose: No external deformity. CV: RRR, No M/G/R. No JVD. No thrill. No extra heart sounds. PULM: CTA B, no wheezes, crackles, rhonchi. No retractions. No resp. distress. No accessory muscle use. ABD: S, NT, ND, +BS. No rebound. No HSM. EXTR: No c/c/e NEURO Normal gait.  PSYCH: Normally interactive. Conversant. Not depressed or anxious appearing.  Calm demeanor.   EKG: NSR, compared with tracing from 2014, no acute or concerning change noted  Assessment and Plan: Wheezing - Plan: albuterol (PROVENTIL HFA;VENTOLIN HFA) 108 (90 Base) MCG/ACT inhaler, DISCONTINUED: albuterol (PROVENTIL HFA;VENTOLIN HFA) 108 (90 Base) MCG/ACT inhaler  Acute right-sided thoracic back pain - Plan: DG Lumbar Spine Complete, DG Thoracic Spine 2 View, traMADol (ULTRAM) 50 MG tablet  Adjustment disorder with mixed anxiety and depressed mood - Plan: citalopram (CELEXA) 20 MG tablet  GAD (generalized anxiety disorder)  Obesity with serious comorbidity, unspecified classification, unspecified obesity type  Shortness of breath on exertion - Plan: EKG 12-Lead, Exercise Tolerance Test  Snoring - Plan: Ambulatory referral to Neurology  Here today to discuss a few concerns She is interested in getting permanent disability Explained that she likely needs to see a disability lawyer for this issue and she will look into  this Referral for sleep study Go back on celexa 20 mg Refilled her tramadol to use for her back pain, lidocaine patches prn Also will set up for treadmill given her recent issues with SOB with exercise and decreased exercise tolerance.     Signed Lamar Blinks, MD

## 2016-08-29 ENCOUNTER — Ambulatory Visit (INDEPENDENT_AMBULATORY_CARE_PROVIDER_SITE_OTHER): Payer: Managed Care, Other (non HMO) | Admitting: Family Medicine

## 2016-08-29 ENCOUNTER — Encounter: Payer: Self-pay | Admitting: Family Medicine

## 2016-08-29 ENCOUNTER — Ambulatory Visit (HOSPITAL_BASED_OUTPATIENT_CLINIC_OR_DEPARTMENT_OTHER)
Admission: RE | Admit: 2016-08-29 | Discharge: 2016-08-29 | Disposition: A | Discharge status: Home / Self Care | Payer: Managed Care, Other (non HMO) | Source: Ambulatory Visit | Attending: Family Medicine | Admitting: Family Medicine

## 2016-08-29 ENCOUNTER — Other Ambulatory Visit: Payer: Self-pay | Admitting: Family Medicine

## 2016-08-29 VITALS — HR 76 | Temp 98.8°F | Ht 64.0 in | Wt 190.4 lb

## 2016-08-29 DIAGNOSIS — E669 Obesity, unspecified: Secondary | ICD-10-CM

## 2016-08-29 DIAGNOSIS — M5136 Other intervertebral disc degeneration, lumbar region: Secondary | ICD-10-CM | POA: Insufficient documentation

## 2016-08-29 DIAGNOSIS — M4316 Spondylolisthesis, lumbar region: Secondary | ICD-10-CM | POA: Diagnosis not present

## 2016-08-29 DIAGNOSIS — R062 Wheezing: Secondary | ICD-10-CM | POA: Diagnosis not present

## 2016-08-29 DIAGNOSIS — R0602 Shortness of breath: Secondary | ICD-10-CM | POA: Diagnosis not present

## 2016-08-29 DIAGNOSIS — M546 Pain in thoracic spine: Secondary | ICD-10-CM | POA: Insufficient documentation

## 2016-08-29 DIAGNOSIS — R0683 Snoring: Secondary | ICD-10-CM | POA: Diagnosis not present

## 2016-08-29 DIAGNOSIS — F4323 Adjustment disorder with mixed anxiety and depressed mood: Secondary | ICD-10-CM

## 2016-08-29 DIAGNOSIS — F411 Generalized anxiety disorder: Secondary | ICD-10-CM | POA: Diagnosis not present

## 2016-08-29 MED ORDER — CITALOPRAM HYDROBROMIDE 20 MG PO TABS: 20.0000 mg | ORAL_TABLET | Freq: Every day | ORAL | 3 refills | Status: DC

## 2016-08-29 MED ORDER — ALBUTEROL SULFATE HFA 108 (90 BASE) MCG/ACT IN AERS: 1.0000 | INHALATION_SPRAY | Freq: Four times a day (QID) | RESPIRATORY_TRACT | 6 refills | Status: DC | PRN

## 2016-08-29 MED ORDER — ALBUTEROL SULFATE HFA 108 (90 BASE) MCG/ACT IN AERS: 1.0000 | INHALATION_SPRAY | Freq: Four times a day (QID) | RESPIRATORY_TRACT | 6 refills | Status: AC | PRN

## 2016-08-29 MED ORDER — TRAMADOL HCL 50 MG PO TABS: 50.0000 mg | ORAL_TABLET | Freq: Every evening | ORAL | 1 refills | Status: DC | PRN

## 2016-08-29 NOTE — Patient Instructions (Addendum)
It was nice to see you today- I am sorry that you are going through such a hard time right now!  I refilled your albuterol for you to use as needed, and also your tramadol to use for pain as needed.  Remember the tramadol can make you feel sleepy! Lidocaine patches (availble OTC) can also help with your back pain  We will get x-rays of your back today and I will let you know how they look We started you back on Celexa today for your mood and pain- let me know if this is not helpful for you  We are also going to arrange for you to have a sleep study, and to do a treadmill test to check on your heart

## 2016-08-29 NOTE — Telephone Encounter (Signed)
Pt says that she contact pharmacy for Tramadol and citalopram. Pharmacy states that medication wasn't received.    Pharmacy: [facility] [address], [place] - [address]

## 2016-08-30 NOTE — Telephone Encounter (Signed)
Called pt and LMOM- meds were sent to [facility] at [place], should be able to pick them up [place] and they did have the ventolin and celexa.  They did not have the tramadol (I called in)-  Reordered over the phone for her

## 2016-10-03 ENCOUNTER — Institutional Professional Consult (permissible substitution): Payer: Self-pay | Admitting: Neurology

## 2016-10-11 ENCOUNTER — Encounter (HOSPITAL_COMMUNITY): Payer: Self-pay

## 2016-10-11 ENCOUNTER — Inpatient Hospital Stay (HOSPITAL_COMMUNITY)
Admission: AD | Admit: 2016-10-11 | Discharge: 2016-10-12 | Disposition: A | Discharge status: Home / Self Care | Payer: Managed Care, Other (non HMO) | Source: Ambulatory Visit | Attending: Family Medicine | Admitting: Family Medicine

## 2016-10-11 DIAGNOSIS — F17210 Nicotine dependence, cigarettes, uncomplicated: Secondary | ICD-10-CM | POA: Insufficient documentation

## 2016-10-11 DIAGNOSIS — J45909 Unspecified asthma, uncomplicated: Secondary | ICD-10-CM | POA: Diagnosis not present

## 2016-10-11 DIAGNOSIS — B373 Candidiasis of vulva and vagina: Secondary | ICD-10-CM | POA: Diagnosis not present

## 2016-10-11 DIAGNOSIS — F419 Anxiety disorder, unspecified: Secondary | ICD-10-CM | POA: Insufficient documentation

## 2016-10-11 DIAGNOSIS — B3731 Acute candidiasis of vulva and vagina: Secondary | ICD-10-CM

## 2016-10-11 DIAGNOSIS — E1122 Type 2 diabetes mellitus with diabetic chronic kidney disease: Secondary | ICD-10-CM | POA: Insufficient documentation

## 2016-10-11 DIAGNOSIS — J069 Acute upper respiratory infection, unspecified: Secondary | ICD-10-CM | POA: Insufficient documentation

## 2016-10-11 DIAGNOSIS — B372 Candidiasis of skin and nail: Secondary | ICD-10-CM | POA: Diagnosis not present

## 2016-10-11 DIAGNOSIS — I129 Hypertensive chronic kidney disease with stage 1 through stage 4 chronic kidney disease, or unspecified chronic kidney disease: Secondary | ICD-10-CM | POA: Insufficient documentation

## 2016-10-11 DIAGNOSIS — L292 Pruritus vulvae: Secondary | ICD-10-CM | POA: Diagnosis present

## 2016-10-11 DIAGNOSIS — E1140 Type 2 diabetes mellitus with diabetic neuropathy, unspecified: Secondary | ICD-10-CM | POA: Insufficient documentation

## 2016-10-11 DIAGNOSIS — F329 Major depressive disorder, single episode, unspecified: Secondary | ICD-10-CM | POA: Insufficient documentation

## 2016-10-11 DIAGNOSIS — E785 Hyperlipidemia, unspecified: Secondary | ICD-10-CM | POA: Diagnosis not present

## 2016-10-11 DIAGNOSIS — N183 Chronic kidney disease, stage 3 (moderate): Secondary | ICD-10-CM | POA: Insufficient documentation

## 2016-10-11 LAB — URINALYSIS, ROUTINE W REFLEX MICROSCOPIC
BACTERIA UA: NONE SEEN
Bilirubin Urine: NEGATIVE
Glucose, UA: NEGATIVE mg/dL
HGB URINE DIPSTICK: NEGATIVE
Ketones, ur: NEGATIVE mg/dL
NITRITE: NEGATIVE
Protein, ur: NEGATIVE mg/dL
SPECIFIC GRAVITY, URINE: 1.013 (ref 1.005–1.030)
pH: 6 (ref 5.0–8.0)

## 2016-10-11 LAB — POCT PREGNANCY, URINE: PREG TEST UR: NEGATIVE

## 2016-10-11 NOTE — MAU Note (Signed)
Pt reports having a yeast infection-burning, itching and swollen- for about 1 week. States it started in groin area and is now in vaginal area. States she has tried apple cider vinegar bath-has not helped. LMP: 09/30/2016.

## 2016-10-11 NOTE — MAU Provider Note (Signed)
History     CSN: [id number]  Arrival date and time: 10/11/16 2204   First Provider Initiated Contact with Patient 10/11/16 2336      Chief Complaint  Patient presents with  . Vaginal Itching    HPI: Renee James is a 46 y.o. [phone] with PMH of T2DM, HTN, CKD, allergic rhinitis, current smoker, who presents to MAU with c/s of yeast infection. She report itching on bilateral inguinal area, now down to vulvar area. No vaginal discharge. She reports she recently had bronchitis and was prescribed albuterol inhaler and antibiotics, and she had a yeast infection after that. She reports having cold-symptoms now and started using albuterol inhaler again. She report congestion, rhinorrhea, and cough. Denies fever, chills, SOB, wheezing, chest pain, N/V/D, abdominal pain, dysuria, hematuria, or urinary frequency. Reports her blood glucose has be in the mid-100s at home.   Past obstetric history: OB History  Gravida Para Term Preterm AB Living  '5       2 3  '$ SAB TAB Ectopic Multiple Live Births    1 1        # Outcome Date GA Lbr Len/2nd Weight Sex Delivery Anes PTL Lv  5 Gravida           4 Gravida           3 Gravida           2 TAB           1 Ectopic               Past Medical History:  Diagnosis Date  . Allergy   . Anxiety   . Arthritis   . Asthma   . Bell's palsy   . Chronic kidney disease (CKD), stage III (moderate) (HCC)   . Depression   . Diabetes mellitus without complication ([place])   . Diabetic neuropathy ([place])   . Hyperlipidemia   . Hypertension   . Pneumonia   . Renal insufficiency 05/13/2015    Past Surgical History:  Procedure Laterality Date  . ECTOPIC PREGNANCY SURGERY    . LAPAROSCOPY N/A 04/28/2015   Procedure: LAPAROSCOPY DIAGNOSTIC;  Surgeon: Donnamae Jude, MD;  Location: [facility];  Service: Gynecology;  Laterality: N/A;  . OVARIAN CYST REMOVAL    . TUBAL LIGATION      Family History  Problem Relation Age of Onset  . Diabetes Mother   . Heart disease  Mother   . Kidney failure Mother   . Stroke Father   . Kidney failure Father   . Breast cancer Unknown     Social History  Substance Use Topics  . Smoking status: Current Every Day Smoker    Packs/day: 0.50    Types: Cigarettes  . Smokeless tobacco: Never Used  . Alcohol use Yes     Comment: occ    Allergies:  Allergies  Allergen Reactions  . Latex Hives, Shortness Of Breath and Itching  . Ace Inhibitors Cough  . 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 albuterol (PROVENTIL HFA;VENTOLIN HFA) 108 (90 Base) MCG/ACT inhaler Inhale 1-2 puffs into the lungs every 6 (six) hours as needed for wheezing or shortness of breath. 1 Inhaler 6 10/11/2016 at Unknown time  . Blood Glucose Monitoring Suppl ([place]) w/Device KIT 1 kit by Does not apply route as directed. USE  AS DIRECTED TO TEST BLOOD SUGARS DAILY DX: E11.9 1 kit 0 Past Week at Unknown time  . citalopram (CELEXA) 20 MG tablet Take 1 tablet (20 mg total) by mouth daily. 90 tablet 3 10/11/2016 at Unknown time  . gabapentin (NEURONTIN) 600 MG tablet Take 600 mg by mouth 3 (three) times daily.   10/10/2016 at Unknown time  . glucose blood (ONETOUCH VERIO) test strip USE AS DIRECTED TO TEST BLOOD SUGARS DAILY DX: E11.9 100 each 12 Past Week at Unknown time  . hydrochlorothiazide (HYDRODIURIL) 25 MG tablet Take 1 tablet (25 mg total) by mouth daily. 90 tablet 3 10/11/2016 at Unknown time  . Insulin Detemir (LEVEMIR) 100 UNIT/ML Pen Inject 10 Units into the skin daily at 10 pm. Check your morning blood sugar every day. If morning blood sugars aren't at goal (100), then increase the amount of evening levemir insulin by 1 unit every night until fasting blood sugars  goal of less than 100 is reached. 5 pen PRN 10/11/2016 at Unknown time  . Lancets (ONETOUCH ULTRASOFT) lancets USE AS DIRECTED TO TEST BLOOD  SUGARS DAILY DX: E11.9 100 each 12 Past Week at Unknown time  . losartan (COZAAR) 100 MG tablet Take 1 tablet (100 mg total) by mouth daily. 30 tablet 11 10/11/2016 at Unknown time  . metFORMIN (GLUCOPHAGE-XR) 500 MG 24 hr tablet Take 4 tablets (2,000 mg total) by mouth daily. 120 tablet 0 10/11/2016 at Unknown time  . pravastatin (PRAVACHOL) 10 MG tablet Take 10 mg by mouth daily.   10/11/2016 at Unknown time  . pregabalin (LYRICA) 50 MG capsule Take 50 mg by mouth 3 (three) times daily.   10/10/2016 at Unknown time  . l-methylfolate-B6-B12 (METANX) 3-35-2 MG TABS tablet Take 1 tablet by mouth 2 (two) times daily.   More than a month at Unknown time  . medroxyPROGESTERone (PROVERA) 10 MG tablet Take 2 tablets (20 mg total) by mouth daily. (Patient not taking: Reported on 08/29/2016) 60 tablet 2 More than a month at Unknown time  . traMADol (ULTRAM) 50 MG tablet Take 1 tablet (50 mg total) by mouth at bedtime as needed. Use as needed for neck and back pain 30 tablet 1 More than a month at Unknown time    Review of Systems - Negative except for what is mentioned in HPI.  Physical Exam   Blood pressure (!) 142/97, pulse 81, temperature 98.5 F (36.9 C), temperature source Oral, resp. rate 19, height '5\' 3"'$  (1.6 m), weight 194 lb (88 kg), last menstrual period 09/30/2016, SpO2 100 %.  Constitutional: no acute distress.  HENT: [place]/AT, edematous nasal mucosa, mildly edematous oral mucosa without lesions.  Neck: No cervical LAD Eyes: normal conjunctivae, no scleral icterus Cardiovascular: normal rate, regular rhythm Respiratory: normal effort, lungs CTAB with no wheezes, rales or rhonchi. GI: Abd soft, non-tender, non-distended MSK: Extremities nontender, no edema, normal ROM Neurologic: Alert and oriented x 4. Psych: Normal mood and affect Skin: bilateral groin and skin fold under pannus with moist red-brown patches with some satellite lesions.  Pelvic: mild vulvar erythema, normal vaginal mucosa  with scant vaginal discharge, no blood, cervix clean. No CMT    MAU Course  Procedures  MDM Pt seen and examined. VS reviewed. Exam c/w candidal intertrigo as well as mild vulvar candidiasis.  Wet prep negative. Pt also has signs and symptoms of URI. Lung exam wnl. No fever.   Assessment and Plan  Assessment: 1. Skin yeast infection   2. Vulvar candidiasis  3. Upper respiratory tract infection, unspecified type     Plan: --Rx for clotrimazole cream of candidiasis. Rx for power also given to be applied to moist skin fold areas. Instructed to keep area dry.  --Counseled on symptomatic tx of URI. Dextromethorphan-guaifenesin prescribed. Discussed return precautionns --Discharge home in stable condition.    Sheniya Garciaperez, Jenne Pane, MD 10/11/2016 11:37 PM

## 2016-10-12 DIAGNOSIS — B372 Candidiasis of skin and nail: Secondary | ICD-10-CM | POA: Diagnosis not present

## 2016-10-12 LAB — WET PREP, GENITAL
Clue Cells Wet Prep HPF POC: NONE SEEN
SPERM: NONE SEEN
TRICH WET PREP: NONE SEEN
Yeast Wet Prep HPF POC: NONE SEEN

## 2016-10-12 MED ORDER — CLOTRIMAZOLE 1 % EX CREA: 1.0000 "application " | TOPICAL_CREAM | Freq: Two times a day (BID) | CUTANEOUS | 0 refills | Status: DC

## 2016-10-12 MED ORDER — NYSTATIN POWD: 1 refills | Status: DC

## 2016-10-12 MED ORDER — DEXTROMETHORPHAN-GUAIFENESIN 10-100 MG/5ML PO LIQD: 10.0000 mL | ORAL | 0 refills | Status: DC | PRN

## 2016-10-12 MED ORDER — CETIRIZINE HCL 10 MG PO TABS: 10.0000 mg | ORAL_TABLET | Freq: Every day | ORAL | 1 refills | Status: DC

## 2016-10-12 NOTE — Discharge Instructions (Signed)
Upper Respiratory Infection, Adult Most upper respiratory infections (URIs) are caused by a virus. A URI affects the nose, throat, and upper air passages. The most common type of URI is often called "the common cold." Follow these instructions at home:  Take medicines only as told by your doctor.  Gargle warm saltwater or take cough drops to comfort your throat as told by your doctor.  Use a warm mist humidifier or inhale steam from a shower to increase air moisture. This may make it easier to breathe.  Drink enough fluid to keep your pee (urine) clear or pale yellow.  Eat soups and other clear broths.  Have a healthy diet.  Rest as needed.  Go back to work when your fever is gone or your doctor says it is okay. ? You may need to stay home longer to avoid giving your URI to others. ? You can also wear a face mask and wash your hands often to prevent spread of the virus.  Use your inhaler more if you have asthma.  Do not use any tobacco products, including cigarettes, chewing tobacco, or electronic cigarettes. If you need help quitting, ask your doctor. Contact a doctor if:  You are getting worse, not better.  Your symptoms are not helped by medicine.  You have chills.  You are getting more short of breath.  You have brown or red mucus.  You have yellow or brown discharge from your nose.  You have pain in your face, especially when you bend forward.  You have a fever.  You have puffy (swollen) neck glands.  You have pain while swallowing.  You have white areas in the back of your throat. Get help right away if:  You have very bad or constant: ? Headache. ? Ear pain. ? Pain in your forehead, behind your eyes, and over your cheekbones (sinus pain). ? Chest pain.  You have long-lasting (chronic) lung disease and any of the following: ? Wheezing. ? Long-lasting cough. ? Coughing up blood. ? A change in your usual mucus.  You have a stiff neck.  You have  changes in your: ? Vision. ? Hearing. ? Thinking. ? Mood. This information is not intended to replace advice given to you by your health care provider. Make sure you discuss any questions you have with your health care provider. Document Released: 06/08/2007 Document Revised: 08/23/2015 Document Reviewed: 03/27/2013 Elsevier Interactive Patient Education  2018 [place].  Skin Yeast Infection Skin yeast infection is a condition in which there is an overgrowth of yeast (candida) that normally lives on the skin. This condition usually occurs in areas of the skin that are constantly warm and moist, such as the armpits or the groin. What are the causes? This condition is caused by a change in the normal balance of the yeast and bacteria that live on the skin. What increases the risk? This condition is more likely to develop in:  People who are obese.  Pregnant women.  Women who take birth control pills.  People who have diabetes.  People who take antibiotic medicines.  People who take steroid medicines.  People who are malnourished.  People who have a weak defense (immune) system.  People who are 48 years of age or older.  What are the signs or symptoms? Symptoms of this condition include:  A red, swollen area of the skin.  Bumps on the skin.  Itchiness.  How is this diagnosed? This condition is diagnosed with a medical history and  physical exam. Your health care provider may check for yeast by taking light scrapings of the skin to be viewed under a microscope. How is this treated? This condition is treated with medicine. Medicines may be prescribed or be available over-the-counter. The medicines may be:  Taken by mouth (orally).  Applied as a cream.  Follow these instructions at home:  Take or apply over-the-counter and prescription medicines only as told by your health care provider.  Eat more yogurt. This may help to keep your yeast infection from  returning.  Maintain a healthy weight. If you need help losing weight, talk with your health care provider.  Keep your skin clean and dry.  If you have diabetes, keep your blood sugar under control. Contact a health care provider if:  Your symptoms go away and then return.  Your symptoms do not get better with treatment.  Your symptoms get worse.  Your rash spreads.  You have a fever or chills.  You have new symptoms.  You have new warmth or redness of your skin. This information is not intended to replace advice given to you by your health care provider. Make sure you discuss any questions you have with your health care provider. Document Released: 09/07/2010 Document Revised: 08/16/2015 Document Reviewed: 06/23/2014 Elsevier Interactive Patient Education  [employer].

## 2016-10-14 ENCOUNTER — Other Ambulatory Visit (HOSPITAL_COMMUNITY)
Admission: RE | Admit: 2016-10-14 | Discharge: 2016-10-14 | Disposition: A | Discharge status: Home / Self Care | Payer: Managed Care, Other (non HMO) | Source: Ambulatory Visit | Attending: Nurse Practitioner | Admitting: Nurse Practitioner

## 2016-10-14 ENCOUNTER — Ambulatory Visit (INDEPENDENT_AMBULATORY_CARE_PROVIDER_SITE_OTHER): Payer: Managed Care, Other (non HMO) | Admitting: Nurse Practitioner

## 2016-10-14 ENCOUNTER — Encounter: Payer: Self-pay | Admitting: Nurse Practitioner

## 2016-10-14 ENCOUNTER — Telehealth: Payer: Self-pay | Admitting: Family Medicine

## 2016-10-14 VITALS — BP 138/79 | HR 87 | Temp 98.9°F | Resp 16 | Ht 64.0 in | Wt 191.8 lb

## 2016-10-14 DIAGNOSIS — N898 Other specified noninflammatory disorders of vagina: Secondary | ICD-10-CM | POA: Insufficient documentation

## 2016-10-14 DIAGNOSIS — J40 Bronchitis, not specified as acute or chronic: Secondary | ICD-10-CM | POA: Diagnosis not present

## 2016-10-14 MED ORDER — DOXYCYCLINE HYCLATE 100 MG PO TABS: 100.0000 mg | ORAL_TABLET | Freq: Two times a day (BID) | ORAL | 0 refills | Status: DC

## 2016-10-14 NOTE — Telephone Encounter (Signed)
Called to advise Pt that she should be seen. Pt did not answer. Left VM asking her to give the office a call to be scheduled to be seen.

## 2016-10-14 NOTE — Telephone Encounter (Signed)
°  Relation to LL:VDIX Call back number:[phone] Pharmacy: [place], [place] - [address]  D.O.D Dr. Nani Ravens   Reason for call:  Patient was seen in the ED 10/11/16 for skin yeast infection,patient states antibiotics was not prescribed, patient would like covering PCP to prescribe antibiotics,please advise

## 2016-10-14 NOTE — Patient Instructions (Addendum)
We will contact you with your test results. Please follow up if vaginal symptoms have not improved next week.  I sent doxycycline antibiotic to your pharmacy for your cough. Please take 1 tablet twice a day for 7 days. Please complete the entire antibiotic. Follow up for fevers 101, worsening cough, if your symptoms haven't improved in three days.  Thanks for letting me take care of you today :)

## 2016-10-14 NOTE — Telephone Encounter (Signed)
Pt was seen today.

## 2016-10-14 NOTE — Telephone Encounter (Signed)
She should be seen, based on note antibiotics were not indicated. Yeast is not a bacterial issue where antibiotics are helpful. TY.

## 2016-10-14 NOTE — Progress Notes (Signed)
Subjective:    Patient ID: Renee James, female    DOB: 10-May-1970, 46 y.o.   MRN: [id number]  HPI  Ms. Renee James is a 46 yo female who presents with chief complaint of vaginal itching. The itching began over 1 week ago. She reports clear vaginal discharge, vulvar burning pain. She says that she feels like her vagina is swollen. She was seen at [facility] for this 3 days ago and diagnosed with a skin yeast infection. She was prescribed clotrimazole cream but she purchased OTC vagisil which has not provided any relief for her symptoms . She denies any fevers, abdominal pain, back pain, nausea, vomiting, urinary frequency, dysuria, hematuria.  She also c/o cough.The cough began this past Saturday. She reports malaise, nasal congestion, green sputum and feeling out of breath at times. She's been using her inhaler and robitussin with some relief. She feels like the cough has improved since onset but persists. She denies weakness, headaches, facial pain or pressure, chest pain.  Review of Systems See HPI  Past Medical History:  Diagnosis Date  . Allergy   . Anxiety   . Arthritis   . Asthma   . Bell's palsy   . Chronic kidney disease (CKD), stage III (moderate) (HCC)   . Depression   . Diabetes mellitus without complication ([place])   . Diabetic neuropathy ([place])   . Hyperlipidemia   . Hypertension   . Pneumonia   . Renal insufficiency 05/13/2015     Social History   Social History  . Marital status: Married    Spouse name: N/A  . Number of children: N/A  . Years of education: N/A   Occupational History  . Not on file.   Social History Main Topics  . Smoking status: Current Every Day Smoker    Packs/day: 0.50    Types: Cigarettes  . Smokeless tobacco: Never Used  . Alcohol use Yes     Comment: occ  . Drug use: No  . Sexual activity: Yes    Birth control/ protection: None   Other Topics Concern  . Not on file   Social History Narrative  . No narrative on file    Past Surgical  History:  Procedure Laterality Date  . ECTOPIC PREGNANCY SURGERY    . LAPAROSCOPY N/A 04/28/2015   Procedure: LAPAROSCOPY DIAGNOSTIC;  Surgeon: Donnamae Jude, MD;  Location: [facility];  Service: Gynecology;  Laterality: N/A;  . OVARIAN CYST REMOVAL    . TUBAL LIGATION      Family History  Problem Relation Age of Onset  . Diabetes Mother   . Heart disease Mother   . Kidney failure Mother   . Stroke Father   . Kidney failure Father   . Breast cancer Unknown     Allergies  Allergen Reactions  . Latex Hives, Shortness Of Breath and Itching  . Ace Inhibitors Cough  . 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 albuterol (PROVENTIL HFA;VENTOLIN HFA) 108 (90 Base) MCG/ACT inhaler Inhale 1-2 puffs into the lungs every 6 (six) hours as needed for wheezing or shortness of breath. 1 Inhaler 6  . Blood Glucose Monitoring Suppl ([place]) w/Device KIT 1 kit by Does not apply route as directed. USE AS DIRECTED TO TEST BLOOD SUGARS DAILY DX: E11.9 1 kit 0  . citalopram (CELEXA) 20 MG  tablet Take 1 tablet (20 mg total) by mouth daily. 90 tablet 3  . dextromethorphan-guaiFENesin (ROBITUSSIN-DM) 10-100 MG/5ML liquid Take 10 mLs by mouth every 4 (four) hours as needed for cough. 180 mL 0  . gabapentin (NEURONTIN) 600 MG tablet Take 600 mg by mouth 3 (three) times daily.    Marland Kitchen glucose blood (ONETOUCH VERIO) test strip USE AS DIRECTED TO TEST BLOOD SUGARS DAILY DX: E11.9 100 each 12  . hydrochlorothiazide (HYDRODIURIL) 25 MG tablet Take 1 tablet (25 mg total) by mouth daily. 90 tablet 3  . Insulin Detemir (LEVEMIR) 100 UNIT/ML Pen Inject 10 Units into the skin daily at 10 pm. Check your morning blood sugar every day. If morning blood sugars aren't at goal (100), then increase the amount of evening levemir insulin by 1 unit every night until fasting blood  sugars  goal of less than 100 is reached. (Patient taking differently: Inject 22 Units into the skin 2 (two) times daily. Check your morning blood sugar every day. If morning blood sugars aren't at goal (100), then increase the amount of evening levemir insulin by 1 unit every night until fasting blood sugars  goal of less than 100 is reached.) 5 pen PRN  . l-methylfolate-B6-B12 (METANX) 3-35-2 MG TABS tablet Take 1 tablet by mouth 2 (two) times daily.    . Lancets (ONETOUCH ULTRASOFT) lancets USE AS DIRECTED TO TEST BLOOD SUGARS DAILY DX: E11.9 100 each 12  . losartan (COZAAR) 100 MG tablet Take 1 tablet (100 mg total) by mouth daily. 30 tablet 11  . metFORMIN (GLUCOPHAGE-XR) 500 MG 24 hr tablet Take 4 tablets (2,000 mg total) by mouth daily. 120 tablet 0  . pravastatin (PRAVACHOL) 10 MG tablet Take 10 mg by mouth daily.    . pregabalin (LYRICA) 50 MG capsule Take 50 mg by mouth 3 (three) times daily.    . traMADol (ULTRAM) 50 MG tablet Take 1 tablet (50 mg total) by mouth at bedtime as needed. Use as needed for neck and back pain 30 tablet 1  . medroxyPROGESTERone (PROVERA) 10 MG tablet Take 2 tablets (20 mg total) by mouth daily. (Patient not taking: Reported on 08/29/2016) 60 tablet 2  . Nystatin POWD Apply to skin lesions 2 to 3 times a day (Patient not taking: Reported on 10/14/2016) 1 Bottle 1   No current facility-administered medications on file prior to visit.     BP 138/79 (BP Location: Left Arm, Cuff Size: Large)   Pulse 87   Temp 98.9 F (37.2 C) (Oral)   Resp 16   Ht _0  (1.626 m)   Wt 191 lb 12.8 oz (87 kg)   LMP 09/30/2016   SpO2 99%   BMI 32.92 kg/m       Objective:   Physical Exam  Constitutional: She is oriented to person, place, and time. She appears well-developed and well-nourished. No distress.  Cardiovascular: Normal rate, regular rhythm and normal heart sounds.   Pulmonary/Chest: Effort normal and breath sounds normal. No respiratory distress. She has no  wheezes. She has no rales.  Abdominal: Soft. Bowel sounds are normal. There is no tenderness. There is no CVA tenderness.  Genitourinary: There is tenderness on the right labia. There is no rash or lesion on the right labia. There is tenderness on the left labia. There is no rash or lesion on the left labia. No erythema or bleeding in the vagina. Vaginal discharge found.  Neurological: She is alert and oriented to person, place, and time.  Skin: Skin is warm and dry.  Psychiatric: She has a normal mood and affect. Judgment and thought content normal.      Assessment & Plan:  Vaginal itching- Wet prep on 10/9 negative. Ancillary testing sent for GC/Chlamydia, Bacteria, Yeast due to persistent symptoms. Instructed to follow up if symptoms arent better next week or if she develops visible sores.  Bronchitis- Due to duration of cough, malaise, I will initiate antibiotic treatment for bronchitis. Doxycyline chosen due to potential drug interactions between z-pak and citalopram. Tessalon offered, patient has some at home that is not expired, she will try. Instructed to follow up if no better in 3 days or for worsening symptoms.

## 2016-10-20 ENCOUNTER — Telehealth: Payer: Self-pay | Admitting: Nurse Practitioner

## 2016-10-20 LAB — CERVICOVAGINAL ANCILLARY ONLY
BACTERIAL VAGINITIS: POSITIVE — AB
Candida vaginitis: NEGATIVE
Chlamydia: NEGATIVE
Neisseria Gonorrhea: NEGATIVE

## 2016-10-20 MED ORDER — METRONIDAZOLE 0.75 % VA GEL: 1.0000 | Freq: Every day | VAGINAL | 0 refills | Status: DC

## 2016-10-20 MED FILL — metroNIDAZOLE 0.75 % GEL: 0.75 | 5 days supply | Qty: 70 | Fill #0

## 2016-10-20 NOTE — Telephone Encounter (Signed)
Please let the patient know that test came back negative for chlamydia, gonorrhea, and yeast. SHe was positive for bacterial vaginosis. I have sent a prescription for flagyl vaginal gel to her pharmacy. She should apply 5g into vaginal applicator once daily for 5 days.

## 2016-10-21 NOTE — Telephone Encounter (Signed)
LVM for pt to call back for results.

## 2016-10-25 NOTE — Telephone Encounter (Signed)
Have tried to reach out to patient for results. LVM for her to call back for results.

## 2016-10-27 NOTE — Telephone Encounter (Signed)
Results sent in the mail. 

## 2016-12-16 ENCOUNTER — Emergency Department (HOSPITAL_BASED_OUTPATIENT_CLINIC_OR_DEPARTMENT_OTHER)
Admission: EM | Admit: 2016-12-16 | Discharge: 2016-12-17 | Disposition: A | Discharge status: Home / Self Care | Payer: Managed Care, Other (non HMO) | Attending: Emergency Medicine | Admitting: Emergency Medicine

## 2016-12-16 ENCOUNTER — Emergency Department (HOSPITAL_BASED_OUTPATIENT_CLINIC_OR_DEPARTMENT_OTHER): Payer: Managed Care, Other (non HMO)

## 2016-12-16 ENCOUNTER — Other Ambulatory Visit: Payer: Self-pay

## 2016-12-16 ENCOUNTER — Encounter (HOSPITAL_BASED_OUTPATIENT_CLINIC_OR_DEPARTMENT_OTHER): Payer: Self-pay | Admitting: *Deleted

## 2016-12-16 DIAGNOSIS — J4521 Mild intermittent asthma with (acute) exacerbation: Secondary | ICD-10-CM

## 2016-12-16 DIAGNOSIS — E1122 Type 2 diabetes mellitus with diabetic chronic kidney disease: Secondary | ICD-10-CM | POA: Diagnosis not present

## 2016-12-16 DIAGNOSIS — Z91040 Latex allergy status: Secondary | ICD-10-CM | POA: Insufficient documentation

## 2016-12-16 DIAGNOSIS — F17210 Nicotine dependence, cigarettes, uncomplicated: Secondary | ICD-10-CM | POA: Insufficient documentation

## 2016-12-16 DIAGNOSIS — R110 Nausea: Secondary | ICD-10-CM | POA: Insufficient documentation

## 2016-12-16 DIAGNOSIS — N183 Chronic kidney disease, stage 3 (moderate): Secondary | ICD-10-CM | POA: Insufficient documentation

## 2016-12-16 DIAGNOSIS — D259 Leiomyoma of uterus, unspecified: Secondary | ICD-10-CM

## 2016-12-16 DIAGNOSIS — I129 Hypertensive chronic kidney disease with stage 1 through stage 4 chronic kidney disease, or unspecified chronic kidney disease: Secondary | ICD-10-CM | POA: Diagnosis not present

## 2016-12-16 DIAGNOSIS — E1140 Type 2 diabetes mellitus with diabetic neuropathy, unspecified: Secondary | ICD-10-CM | POA: Insufficient documentation

## 2016-12-16 DIAGNOSIS — Z794 Long term (current) use of insulin: Secondary | ICD-10-CM | POA: Diagnosis not present

## 2016-12-16 DIAGNOSIS — N83201 Unspecified ovarian cyst, right side: Secondary | ICD-10-CM

## 2016-12-16 DIAGNOSIS — R079 Chest pain, unspecified: Secondary | ICD-10-CM | POA: Diagnosis present

## 2016-12-16 LAB — CBC WITH DIFFERENTIAL/PLATELET
BASOS PCT: 0 %
Basophils Absolute: 0 10*3/uL (ref 0.0–0.1)
EOS ABS: 0.4 10*3/uL (ref 0.0–0.7)
EOS PCT: 5 %
HCT: 34.7 % — ABNORMAL LOW (ref 36.0–46.0)
Hemoglobin: 11.4 g/dL — ABNORMAL LOW (ref 12.0–15.0)
LYMPHS ABS: 4 10*3/uL (ref 0.7–4.0)
Lymphocytes Relative: 42 %
MCH: 27.1 pg (ref 26.0–34.0)
MCHC: 32.9 g/dL (ref 30.0–36.0)
MCV: 82.4 fL (ref 78.0–100.0)
Monocytes Absolute: 0.7 10*3/uL (ref 0.1–1.0)
Monocytes Relative: 8 %
NEUTROS PCT: 45 %
Neutro Abs: 4.3 10*3/uL (ref 1.7–7.7)
PLATELETS: 359 10*3/uL (ref 150–400)
RBC: 4.21 MIL/uL (ref 3.87–5.11)
RDW: 14.1 % (ref 11.5–15.5)
WBC: 9.5 10*3/uL (ref 4.0–10.5)

## 2016-12-16 LAB — COMPREHENSIVE METABOLIC PANEL
ALBUMIN: 3.2 g/dL — AB (ref 3.5–5.0)
ALT: 14 U/L (ref 14–54)
ANION GAP: 6 (ref 5–15)
AST: 20 U/L (ref 15–41)
Alkaline Phosphatase: 59 U/L (ref 38–126)
BUN: 13 mg/dL (ref 6–20)
CHLORIDE: 106 mmol/L (ref 101–111)
CO2: 25 mmol/L (ref 22–32)
CREATININE: 1.06 mg/dL — AB (ref 0.44–1.00)
Calcium: 8.7 mg/dL — ABNORMAL LOW (ref 8.9–10.3)
GFR calc non Af Amer: >60 mL/min (ref >60)
GLUCOSE: 130 mg/dL — AB (ref 65–99)
Potassium: 3.5 mmol/L (ref 3.5–5.1)
SODIUM: 137 mmol/L (ref 135–145)
Total Bilirubin: 0.4 mg/dL (ref 0.3–1.2)
Total Protein: 6.4 g/dL — ABNORMAL LOW (ref 6.5–8.1)

## 2016-12-16 LAB — URINALYSIS, ROUTINE W REFLEX MICROSCOPIC
BILIRUBIN URINE: NEGATIVE
Glucose, UA: NEGATIVE mg/dL
Hgb urine dipstick: NEGATIVE
Ketones, ur: 15 mg/dL — AB
LEUKOCYTES UA: NEGATIVE
NITRITE: NEGATIVE
PH: 6 (ref 5.0–8.0)
Protein, ur: NEGATIVE mg/dL
Specific Gravity, Urine: 1.025 (ref 1.005–1.030)

## 2016-12-16 LAB — TROPONIN I

## 2016-12-16 LAB — LIPASE, BLOOD: Lipase: 24 U/L (ref 11–51)

## 2016-12-16 LAB — D-DIMER, QUANTITATIVE (NOT AT ARMC): D DIMER QUANT: 0.34 ug{FEU}/mL (ref 0.00–0.50)

## 2016-12-16 LAB — PREGNANCY, URINE: PREG TEST UR: NEGATIVE

## 2016-12-16 MED ORDER — SODIUM CHLORIDE 0.9 % IV BOLUS (SEPSIS)
1000.0000 mL | Freq: Once | INTRAVENOUS | Status: AC
  Administered 2016-12-16: 1000 mL via INTRAVENOUS

## 2016-12-16 MED ORDER — PREDNISONE 50 MG PO TABS
60.0000 mg | ORAL_TABLET | Freq: Once | ORAL | Status: AC
  Administered 2016-12-16: 60 mg via ORAL
  Filled 2016-12-16: qty 1

## 2016-12-16 MED ORDER — IOPAMIDOL (ISOVUE-300) INJECTION 61%
100.0000 mL | Freq: Once | INTRAVENOUS | Status: AC | PRN
  Administered 2016-12-16: 100 mL via INTRAVENOUS

## 2016-12-16 MED ORDER — CYCLOBENZAPRINE HCL 5 MG PO TABS
5.0000 mg | ORAL_TABLET | Freq: Once | ORAL | Status: AC
  Administered 2016-12-16: 5 mg via ORAL
  Filled 2016-12-16: qty 1

## 2016-12-16 MED ORDER — ALBUTEROL SULFATE HFA 108 (90 BASE) MCG/ACT IN AERS
2.0000 | INHALATION_SPRAY | Freq: Once | RESPIRATORY_TRACT | Status: AC
  Administered 2016-12-16: 2 via RESPIRATORY_TRACT
  Filled 2016-12-16: qty 6.7

## 2016-12-16 NOTE — ED Triage Notes (Addendum)
Woke from a nap this evening with pain in her upper chest and abdominal bloating. Nausea. She almost fell on the ice 3 days ago and twisted as she fell onto her left hip. She is ambulatory.

## 2016-12-16 NOTE — ED Provider Notes (Addendum)
[facility] EMERGENCY DEPARTMENT Provider Note   CSN: [id number] Arrival date & time: 12/16/16  1842     History   Chief Complaint Chief Complaint  Patient presents with  . Chest Pain  . Abdominal Pain    HPI Cohen Doleman is a 46 y.o. female history of CKD, diabetes, hypertension, hyperlipidemia here presenting with abdominal distention, nausea, chest pain.  Patient states that she slipped and fell and hit her left hip 3 days ago and has some pain when she walks on it.  She also has been having intermittent abdominal distention and diffuse abdominal pain for several days.  She also has several days of substernal chest pain and today developed pleuritic chest pain.  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  Patient did have previous C-section but denies any history of SBO.   The history is provided by the patient.    Past Medical History:  Diagnosis Date  . Allergy   . Anxiety   . Arthritis   . Asthma   . Bell's palsy   . Chronic kidney disease (CKD), stage III (moderate) (HCC)   . Depression   . Diabetes mellitus without complication ([place])   . Diabetic neuropathy ([employer])   . Hyperlipidemia   . Hypertension   . Pneumonia   . Renal insufficiency 05/13/2015    Patient Active Problem List   Diagnosis Date Noted  . CKD (chronic kidney disease) stage 3, GFR 30-59 ml/min (HCC) 05/16/2016  . Renal insufficiency 05/13/2015  . Ovarian torsion 04/28/2015  . Pain   . Hyperlipidemia 03/19/2015  . Obesity, unspecified 03/19/2015  . Controlled type 2 diabetes mellitus without complication, without long-term current use of insulin ([place]) 03/19/2015  . Essential hypertension 03/19/2015  . GAD (generalized anxiety disorder) 03/19/2015    Past Surgical History:  Procedure Laterality Date  . ECTOPIC PREGNANCY SURGERY    . LAPAROSCOPY N/A 04/28/2015   Procedure: LAPAROSCOPY DIAGNOSTIC;  Surgeon: Donnamae Jude, MD;   Location: [facility];  Service: Gynecology;  Laterality: N/A;  . OVARIAN CYST REMOVAL    . TUBAL LIGATION      OB History    Gravida Para Term Preterm AB Living   '5       2 3   '$ SAB TAB Ectopic Multiple Live Births     1 1           Home Medications    Prior to Admission medications   Medication Sig Start Date End Date Taking? Authorizing Provider  citalopram (CELEXA) 20 MG tablet Take 1 tablet (20 mg total) by mouth daily. 08/29/16  Yes Copland, Gay Filler, MD  gabapentin (NEURONTIN) 600 MG tablet Take 600 mg by mouth 3 (three) times daily.   Yes [provider]  glucose blood (ONETOUCH VERIO) test strip USE AS DIRECTED TO TEST BLOOD SUGARS DAILY DX: E11.9 04/13/16  Yes Copland, Gay Filler, MD  Insulin Detemir (LEVEMIR) 100 UNIT/ML Pen Inject 10 Units into the skin daily at 10 pm. Check your morning blood sugar every day. If morning blood sugars aren't at goal (100), then increase the amount of evening levemir insulin by 1 unit every night until fasting blood sugars  goal of less than 100 is reached. Patient taking differently: Inject 22 Units into the skin 2 (two) times daily. Check your morning blood sugar every day. If morning blood sugars aren't at goal (100), then increase the  amount of evening levemir insulin by 1 unit every night until fasting blood sugars  goal of less than 100 is reached. 03/07/16  Yes Saguier, Percell Miller, PA-C  l-methylfolate-B6-B12 (METANX) 3-35-2 MG TABS tablet Take 1 tablet by mouth 2 (two) times daily.   Yes [provider]  losartan (COZAAR) 100 MG tablet Take 1 tablet (100 mg total) by mouth daily. 04/13/16  Yes Copland, Gay Filler, MD  metFORMIN (GLUCOPHAGE-XR) 500 MG 24 hr tablet Take 4 tablets (2,000 mg total) by mouth daily. 07/15/16  Yes Copland, Gay Filler, MD  pravastatin (PRAVACHOL) 10 MG tablet Take 10 mg by mouth daily.   Yes [provider]  pregabalin (LYRICA) 50 MG capsule Take 50 mg by mouth 3 (three) times daily.   Yes [provider]  TORSEMIDE PO Take by mouth.   Yes [provider]  albuterol (PROVENTIL HFA;VENTOLIN HFA) 108 (90 Base) MCG/ACT inhaler Inhale 1-2 puffs into the lungs every 6 (six) hours as needed for wheezing or shortness of breath. 08/29/16   Copland, Gay Filler, MD  Blood Glucose Monitoring Suppl ([place]) w/Device KIT 1 kit by Does not apply route as directed. USE AS DIRECTED TO TEST BLOOD SUGARS DAILY DX: E11.9 03/04/16   Saguier, Percell Miller, PA-C  dextromethorphan-guaiFENesin (ROBITUSSIN-DM) 10-100 MG/5ML liquid Take 10 mLs by mouth every 4 (four) hours as needed for cough. 10/12/16   Degele, Jenne Pane, MD  doxycycline (VIBRA-TABS) 100 MG tablet Take 1 tablet (100 mg total) by mouth 2 (two) times daily. 10/14/16   Lance Sell, NP  hydrochlorothiazide (HYDRODIURIL) 25 MG tablet Take 1 tablet (25 mg total) by mouth daily. 12/14/15   Copland, Gay Filler, MD  Lancets [facility] ULTRASOFT) lancets USE AS DIRECTED TO TEST BLOOD SUGARS DAILY DX: E11.9 03/07/16   Saguier, Percell Miller, PA-C  medroxyPROGESTERone (PROVERA) 10 MG tablet Take 2 tablets (20 mg total) by mouth daily. Patient not taking: Reported on 08/29/2016 02/17/16   Truett Mainland, DO  metroNIDAZOLE (METROGEL) 0.75 % vaginal gel Place 1 Applicatorful vaginally at bedtime. Insert 5 grams of gel to vagina once daily for 5 days. 10/20/16   Lance Sell, NP  Nystatin POWD Apply to skin lesions 2 to 3 times a day Patient not taking: Reported on 10/14/2016 10/12/16   Degele, Jenne Pane, MD  predniSONE (DELTASONE) 20 MG tablet Take 60 mg daily x 2 days then 40 mg daily x 2 days then 20 mg daily x 2 days 12/17/16   Drenda Freeze, MD  traMADol (ULTRAM) 50 MG tablet Take 1 tablet (50 mg total) by mouth every 6 (six) hours as needed. 12/17/16   Drenda Freeze, MD    Family History Family History  Problem Relation Age of Onset  . Diabetes Mother   . Heart disease Mother   . Kidney failure Mother   . Stroke  Father   . Kidney failure Father   . Breast cancer Unknown     Social History Social History   Tobacco Use  . Smoking status: Current Every Day Smoker    Packs/day: 0.50    Types: Cigarettes  . Smokeless tobacco: Never Used  Substance Use Topics  . Alcohol use: Yes    Comment: occ  . Drug use: No     Allergies   Latex; Ace inhibitors; and Other   Review of Systems Review of Systems  Cardiovascular: Positive for chest pain.  Gastrointestinal: Positive for abdominal pain.  All other systems reviewed and  are negative.    Physical Exam Updated Vital Signs BP (!) 144/77 (BP Location: Left Arm)   Pulse 91   Temp 98.4 F (36.9 C) (Oral)   Resp 20   Ht '5\' 3"'$  (1.6 m)   Wt 86.2 kg (190 lb)   LMP 12/05/2016   SpO2 98%   BMI 33.66 kg/m   Physical Exam  Constitutional: She appears well-developed.  HENT:  Head: Normocephalic.  Eyes: Pupils are equal, round, and reactive to light.  Neck: Normal range of motion.  Cardiovascular: Normal rate, regular rhythm and normal pulses.  Pulmonary/Chest: Effort normal.  Reproducible chest tenderness   Abdominal: Soft. Bowel sounds are normal.  Mild diffuse lower abdominal tenderness, no rebound   Musculoskeletal: Normal range of motion.       Right lower leg: Normal. She exhibits no tenderness and no edema.       Left lower leg: Normal. She exhibits no tenderness and no edema.  Neurological: She is alert.  Skin: Skin is warm. Capillary refill takes less than 2 seconds.  Psychiatric: She has a normal mood and affect.  Nursing note and vitals reviewed.    ED Treatments / Results  Labs (all labs ordered are listed, but only abnormal results are displayed) Labs Reviewed  URINALYSIS, ROUTINE W REFLEX MICROSCOPIC - Abnormal; Notable for the following components:      Result Value   Ketones, ur 15 (*)    All other components within normal limits  CBC WITH DIFFERENTIAL/PLATELET - Abnormal; Notable for the following components:     Hemoglobin 11.4 (*)    HCT 34.7 (*)    All other components within normal limits  COMPREHENSIVE METABOLIC PANEL - Abnormal; Notable for the following components:   Glucose, Bld 130 (*)    Creatinine, Ser 1.06 (*)    Calcium 8.7 (*)    Total Protein 6.4 (*)    Albumin 3.2 (*)    All other components within normal limits  PREGNANCY, URINE  LIPASE, BLOOD  TROPONIN I  D-DIMER, QUANTITATIVE (NOT AT [facility])    EKG  EKG Interpretation  Date/Time:  Friday December 16 2016 19:07:35 EST Ventricular Rate:  62 PR Interval:    QRS Duration: 100 QT Interval:  389 QTC Calculation: 395 R Axis:   17 Text Interpretation:  Sinus rhythm Borderline short PR interval Low voltage, precordial leads Baseline wander in lead(s) I V6 No significant change since last tracing Confirmed by Wandra Arthurs [phone]) on 12/16/2016 8:03:37 PM       Radiology Dg Chest 2 View  Result Date: 12/16/2016 CLINICAL DATA:  Chest pain. Shortness of breath. Abdominal distension. EXAM: CHEST  2 VIEW COMPARISON:  Radiograph 02/25/2016 FINDINGS: Heart size upper normal. The cardiomediastinal contours are normal. Increased bronchial thickening from prior exam. Pulmonary vasculature is normal. No consolidation, pleural effusion, or pneumothorax. No acute osseous abnormalities are seen. IMPRESSION: Increased bronchial thickening may be due to bronchitis or asthma. Electronically Signed   By: Jeb Levering M.D.   On: 12/16/2016 22:28   US Transvaginal Non-ob  Result Date: 12/17/2016 CLINICAL DATA:  Assess large right adnexal cystic lesion seen on CT. Lower abdominal pain, acute onset. EXAM: TRANSABDOMINAL AND TRANSVAGINAL ULTRASOUND OF PELVIS DOPPLER ULTRASOUND OF OVARIES TECHNIQUE: Both transabdominal and transvaginal ultrasound examinations of the pelvis were performed. Transabdominal technique was performed for global imaging of the pelvis including uterus, ovaries, adnexal regions, and pelvic cul-de-sac. It was necessary to  proceed with endovaginal exam following the transabdominal exam to  visualize the uterus and ovaries in greater detail. Color and duplex Doppler ultrasound was utilized to evaluate blood flow to the ovaries. COMPARISON:  None. FINDINGS: Uterus Measurements: 10.3 x 5.5 x 5.9 cm. A 1.8 cm intramural fibroid is noted at the left anterior uterine wall. Multiple nabothian cysts are noted. Endometrium Thickness: 1.0 cm.  No focal abnormality visualized. Right ovary Not well characterized. There is a 4.4 x 3.7 x 4.2 cm anechoic cyst at the right adnexa, with likely adjacent ovarian tissue. Blood flow at the soft tissue rim is not well characterized due to its location and overlying bowel gas. Left ovary Measurements: 3.3 x 2.3 x 2.3 cm. Normal appearance/no adnexal mass. Pulsed Doppler evaluation of the left ovary demonstrates normal low-resistance arterial and venous waveforms. Other findings No abnormal free fluid. IMPRESSION: 1. No definite evidence for ovarian torsion. However, the right ovary is not well characterized due to a 4.4 cm right adnexal cyst and overlying structures. 2. 4.4 cm right adnexal simple appearing cyst. 3. Small intramural uterine fibroid noted. Uterus otherwise unremarkable in appearance. Electronically Signed   By: Garald Balding M.D.   On: 12/17/2016 00:12   US Pelvis Complete  Result Date: 12/17/2016 CLINICAL DATA:  Assess large right adnexal cystic lesion seen on CT. Lower abdominal pain, acute onset. EXAM: TRANSABDOMINAL AND TRANSVAGINAL ULTRASOUND OF PELVIS DOPPLER ULTRASOUND OF OVARIES TECHNIQUE: Both transabdominal and transvaginal ultrasound examinations of the pelvis were performed. Transabdominal technique was performed for global imaging of the pelvis including uterus, ovaries, adnexal regions, and pelvic cul-de-sac. It was necessary to proceed with endovaginal exam following the transabdominal exam to visualize the uterus and ovaries in greater detail. Color and duplex Doppler  ultrasound was utilized to evaluate blood flow to the ovaries. COMPARISON:  None. FINDINGS: Uterus Measurements: 10.3 x 5.5 x 5.9 cm. A 1.8 cm intramural fibroid is noted at the left anterior uterine wall. Multiple nabothian cysts are noted. Endometrium Thickness: 1.0 cm.  No focal abnormality visualized. Right ovary Not well characterized. There is a 4.4 x 3.7 x 4.2 cm anechoic cyst at the right adnexa, with likely adjacent ovarian tissue. Blood flow at the soft tissue rim is not well characterized due to its location and overlying bowel gas. Left ovary Measurements: 3.3 x 2.3 x 2.3 cm. Normal appearance/no adnexal mass. Pulsed Doppler evaluation of the left ovary demonstrates normal low-resistance arterial and venous waveforms. Other findings No abnormal free fluid. IMPRESSION: 1. No definite evidence for ovarian torsion. However, the right ovary is not well characterized due to a 4.4 cm right adnexal cyst and overlying structures. 2. 4.4 cm right adnexal simple appearing cyst. 3. Small intramural uterine fibroid noted. Uterus otherwise unremarkable in appearance. Electronically Signed   By: Garald Balding M.D.   On: 12/17/2016 00:12   Ct Abdomen Pelvis W Contrast  Result Date: 12/16/2016 CLINICAL DATA:  Acute onset of generalized abdominal pain. EXAM: CT ABDOMEN AND PELVIS WITH CONTRAST TECHNIQUE: Multidetector CT imaging of the abdomen and pelvis was performed using the standard protocol following bolus administration of intravenous contrast. CONTRAST:  124m ISOVUE-300 IOPAMIDOL (ISOVUE-300) INJECTION 61% COMPARISON:  CT of the abdomen and pelvis from 04/28/2015, and renal ultrasound performed 05/20/2016 FINDINGS: Lower chest: The visualized lung bases are grossly clear. The visualized portions of the mediastinum are unremarkable. Hepatobiliary: The liver is unremarkable in appearance. The gallbladder is unremarkable in appearance. The common bile duct remains normal in caliber. Pancreas: The pancreas is  within normal limits. Spleen: The spleen is unremarkable  in appearance. Adrenals/Urinary Tract: Calcifications at the right adrenal gland may reflect remote traumatic injury. The left adrenal gland is unremarkable in appearance. The kidneys are grossly unremarkable in appearance. There is no evidence of hydronephrosis. No renal or ureteral stones are identified. No perinephric stranding is seen. Stomach/Bowel: The stomach is unremarkable in appearance. The small bowel is within normal limits. The appendix is normal in caliber, without evidence of appendicitis. The colon is unremarkable in appearance. Vascular/Lymphatic: Scattered calcification is seen along the abdominal aorta and its branches. The abdominal aorta is otherwise grossly unremarkable. The inferior vena cava is grossly unremarkable. No retroperitoneal lymphadenopathy is seen. No pelvic sidewall lymphadenopathy is identified. Reproductive: The bladder is mildly distended and grossly unremarkable. A small uterine fibroid is noted at the fundus. A 4.9 cm cystic focus is noted at the right adnexa. The left ovary is unremarkable in appearance. Other: No additional soft tissue abnormalities are seen. Musculoskeletal: No acute osseous abnormalities are identified. Mild facet disease is noted at the lower lumbar spine. The visualized musculature is unremarkable in appearance. IMPRESSION: 1. No acute abnormality seen within the abdomen or pelvis. 2. 4.9 cm cystic focus at the right adnexa may be physiologic in nature, though pelvic ultrasound would be helpful for further evaluation, when and as deemed clinically appropriate. 3. Small uterine fibroid noted. Aortic Atherosclerosis (ICD10-I70.0). Electronically Signed   By: Garald Balding M.D.   On: 12/16/2016 22:25   [place] Art/ven Flow Abd Pelv Doppler  Result Date: 12/17/2016 CLINICAL DATA:  Assess large right adnexal cystic lesion seen on CT. Lower abdominal pain, acute onset. EXAM: TRANSABDOMINAL AND  TRANSVAGINAL ULTRASOUND OF PELVIS DOPPLER ULTRASOUND OF OVARIES TECHNIQUE: Both transabdominal and transvaginal ultrasound examinations of the pelvis were performed. Transabdominal technique was performed for global imaging of the pelvis including uterus, ovaries, adnexal regions, and pelvic cul-de-sac. It was necessary to proceed with endovaginal exam following the transabdominal exam to visualize the uterus and ovaries in greater detail. Color and duplex Doppler ultrasound was utilized to evaluate blood flow to the ovaries. COMPARISON:  None. FINDINGS: Uterus Measurements: 10.3 x 5.5 x 5.9 cm. A 1.8 cm intramural fibroid is noted at the left anterior uterine wall. Multiple nabothian cysts are noted. Endometrium Thickness: 1.0 cm.  No focal abnormality visualized. Right ovary Not well characterized. There is a 4.4 x 3.7 x 4.2 cm anechoic cyst at the right adnexa, with likely adjacent ovarian tissue. Blood flow at the soft tissue rim is not well characterized due to its location and overlying bowel gas. Left ovary Measurements: 3.3 x 2.3 x 2.3 cm. Normal appearance/no adnexal mass. Pulsed Doppler evaluation of the left ovary demonstrates normal low-resistance arterial and venous waveforms. Other findings No abnormal free fluid. IMPRESSION: 1. No definite evidence for ovarian torsion. However, the right ovary is not well characterized due to a 4.4 cm right adnexal cyst and overlying structures. 2. 4.4 cm right adnexal simple appearing cyst. 3. Small intramural uterine fibroid noted. Uterus otherwise unremarkable in appearance. Electronically Signed   By: Garald Balding M.D.   On: 12/17/2016 00:12    Procedures Procedures (including critical care time)  Medications Ordered in ED Medications  cyclobenzaprine (FLEXERIL) tablet 5 mg (5 mg Oral Given 12/16/16 2045)  sodium chloride 0.9 % bolus 1,000 mL (0 mLs Intravenous Stopped 12/16/16 2135)  iopamidol (ISOVUE-300) 61 % injection 100 mL (100 mLs Intravenous  Contrast Given 12/16/16 2141)  predniSONE (DELTASONE) tablet 60 mg (60 mg Oral Given 12/16/16 2334)  albuterol (PROVENTIL HFA;VENTOLIN  HFA) 108 (90 Base) MCG/ACT inhaler 2 puff (2 puffs Inhalation Given 12/16/16 2325)     Initial Impression / Assessment and Plan / ED Course  I have reviewed the triage vital signs and the nursing notes.  Pertinent labs & imaging results that were available during my care of the patient were reviewed by me and considered in my medical decision making (see chart for details).    Renee James is a 46 y.o. female here with chest pain, abdominal pain. 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 Will get labs, CT ab/pel, UA to assess abdominal pain.   12:16 AM Patient's labs unremarkable. D-dimer negative. CT ab/pel showed 5 cm cyst in R adnexa. She has minimal diffuse lower abdominal tenderness with no rebound. Has hx of torsion and required emergent surgery a year ago. US showed simple cyst. Difficulty visualize R ovary but no obvious torsion. I called Dr. Ilda Bass[facility]. He reviewed her OP note from last year. He states that she had a lot of scar tissue and he was able to see flow in R ovary. He has low suspicion for torsion and doesn't recommend emergent OR management. She didn't require pain meds in the ED. She does have mild asthma on CXR that can explain her chest pain. Will dc home with prednisone, albuterol prn, tramadol prn pain. Patient will need to call OB for follow up on Monday.   Final Clinical Impressions(s) / ED Diagnoses   Final diagnoses:  Ovarian cyst, right  Uterine leiomyoma, unspecified location  Mild intermittent asthma with acute exacerbation    ED Discharge Orders        Ordered    predniSONE (DELTASONE) 20 MG tablet     12/17/16 0012    traMADol (ULTRAM) 50 MG tablet  Every 6 hours PRN     12/17/16 0012       Drenda Freeze, MD 12/17/16 Berniece Salines    Drenda Freeze, MD 12/17/16 [phone]

## 2016-12-17 MED ORDER — TRAMADOL HCL 50 MG PO TABS: 50.0000 mg | ORAL_TABLET | Freq: Four times a day (QID) | ORAL | 0 refills | Status: DC | PRN

## 2016-12-17 MED ORDER — PREDNISONE 20 MG PO TABS: ORAL_TABLET | ORAL | 0 refills | Status: DC

## 2016-12-17 NOTE — Discharge Instructions (Signed)
Take prednisone as prescribed.   Take albuterol every 4 hrs as needed for cough.   Take tylenol for pain.   Take tramadol for severe pain.   You have ovarian cyst and fibroids. Call Dr. Glenna Durand office for appointment on Monday.   See your primary care doctor  Return to ER if you have worse abdominal pain, vomiting, chest pain, trouble breathing.

## 2017-01-11 ENCOUNTER — Encounter: Payer: Self-pay | Admitting: Obstetrics & Gynecology

## 2017-01-11 ENCOUNTER — Ambulatory Visit (INDEPENDENT_AMBULATORY_CARE_PROVIDER_SITE_OTHER): Payer: Managed Care, Other (non HMO) | Admitting: Obstetrics & Gynecology

## 2017-01-11 ENCOUNTER — Other Ambulatory Visit (HOSPITAL_COMMUNITY)
Admission: RE | Admit: 2017-01-11 | Discharge: 2017-01-11 | Disposition: A | Discharge status: Home / Self Care | Payer: Managed Care, Other (non HMO) | Source: Ambulatory Visit | Attending: Obstetrics & Gynecology | Admitting: Obstetrics & Gynecology

## 2017-01-11 VITALS — BP 144/65 | HR 63 | Ht 63.0 in

## 2017-01-11 DIAGNOSIS — N83201 Unspecified ovarian cyst, right side: Secondary | ICD-10-CM

## 2017-01-11 DIAGNOSIS — R102 Pelvic and perineal pain: Secondary | ICD-10-CM

## 2017-01-11 DIAGNOSIS — N939 Abnormal uterine and vaginal bleeding, unspecified: Secondary | ICD-10-CM | POA: Diagnosis not present

## 2017-01-11 DIAGNOSIS — Z3202 Encounter for pregnancy test, result negative: Secondary | ICD-10-CM | POA: Diagnosis not present

## 2017-01-11 DIAGNOSIS — D219 Benign neoplasm of connective and other soft tissue, unspecified: Secondary | ICD-10-CM | POA: Diagnosis not present

## 2017-01-11 DIAGNOSIS — Z01812 Encounter for preprocedural laboratory examination: Secondary | ICD-10-CM

## 2017-01-11 LAB — POCT URINE PREGNANCY: PREG TEST UR: NEGATIVE

## 2017-01-11 NOTE — Progress Notes (Signed)
Patient states she is here to follow up from the cyst they found at her ED visit on 12-16-16. Patient states that she has a sore spot in her abdomen. [name] RNBSN

## 2017-01-11 NOTE — Progress Notes (Signed)
History:  47 y.o. [id number] here today for AUB and pelvic pain.Pt is s/p SVD x3. S/op EAB x1   Pt is s/p laparotomy for EC in 1990's. She had a salpingectomy at that time. She then she had a oophorectomy via laparotomy for OV cyst in 1995 or 1997. Pt is s/p UTL in 1992. She currently has pain and bleeding .   Pts has a sister who was 28 at the time of dx of breast ca.    The following portions of the patient's history were reviewed and updated as appropriate: allergies, current medications, past family history, past medical history, past social history, past surgical history and problem list.  Review of Systems:  Pertinent items are noted in HPI.   Objective:  Physical Exam Blood pressure (!) 144/65, pulse 63, height 5\' 3"  (1.6 m), last menstrual period 01/02/2017.  CONSTITUTIONAL: Well-developed, well-nourished female in no acute distress.  HENT:  Normocephalic, atraumatic EYES: Conjunctivae and EOM are normal. No scleral icterus.  NECK: Normal range of motion SKIN: Skin is warm and dry. No rash noted. Not diaphoretic.No pallor. [place]: Alert and oriented to person, place, and time. Normal coordination.  Abd: Soft, nontender and nondistended; obese. Well healed abd incision Pelvic: Normal appearing external genitalia; normal appearing vaginal mucosa and cervix.  Normal discharge.  Uterus difficult to palpate due to pts body habitus. u  The indications for endometrial biopsy were reviewed.   Risks of the biopsy including cramping, bleeding, infection, uterine perforation, inadequate specimen and need for additional procedures  were discussed. The patient states she understands and agrees to undergo procedure today. Consent was signed. Time out was performed. Urine HCG was negative. A sterile speculum was placed in the patient's vagina and the cervix was prepped with Betadine. A single-toothed tenaculum was placed on the anterior lip of the cervix to stabilize it. The 3 mm pipelle was  introduced into the endometrial cavity without difficulty to a depth of 8cm, and a moderate amount of tissue was obtained and sent to pathology. The instruments were removed from the patient's vagina. Minimal bleeding from the cervix was noted. The patient tolerated the procedure well.   Labs and Imaging Dg Chest 2 View  Result Date: 12/16/2016 CLINICAL DATA:  Chest pain. Shortness of breath. Abdominal distension. EXAM: CHEST  2 VIEW COMPARISON:  Radiograph 02/25/2016 FINDINGS: Heart size upper normal. The cardiomediastinal contours are normal. Increased bronchial thickening from prior exam. Pulmonary vasculature is normal. No consolidation, pleural effusion, or pneumothorax. No acute osseous abnormalities are seen. IMPRESSION: Increased bronchial thickening may be due to bronchitis or asthma. Electronically Signed   By: Jeb Levering M.D.   On: 12/16/2016 22:28   US Transvaginal Non-ob  Result Date: 12/17/2016 CLINICAL DATA:  Assess large right adnexal cystic lesion seen on CT. Lower abdominal pain, acute onset. EXAM: TRANSABDOMINAL AND TRANSVAGINAL ULTRASOUND OF PELVIS DOPPLER ULTRASOUND OF OVARIES TECHNIQUE: Both transabdominal and transvaginal ultrasound examinations of the pelvis were performed. Transabdominal technique was performed for global imaging of the pelvis including uterus, ovaries, adnexal regions, and pelvic cul-de-sac. It was necessary to proceed with endovaginal exam following the transabdominal exam to visualize the uterus and ovaries in greater detail. Color and duplex Doppler ultrasound was utilized to evaluate blood flow to the ovaries. COMPARISON:  None. FINDINGS: Uterus Measurements: 10.3 x 5.5 x 5.9 cm. A 1.8 cm intramural fibroid is noted at the left anterior uterine wall. Multiple nabothian cysts are noted. Endometrium Thickness: 1.0 cm.  No focal abnormality visualized. Right  ovary Not well characterized. There is a 4.4 x 3.7 x 4.2 cm anechoic cyst at the right adnexa, with  likely adjacent ovarian tissue. Blood flow at the soft tissue rim is not well characterized due to its location and overlying bowel gas. Left ovary Measurements: 3.3 x 2.3 x 2.3 cm. Normal appearance/no adnexal mass. Pulsed Doppler evaluation of the left ovary demonstrates normal low-resistance arterial and venous waveforms. Other findings No abnormal free fluid. IMPRESSION: 1. No definite evidence for ovarian torsion. However, the right ovary is not well characterized due to a 4.4 cm right adnexal cyst and overlying structures. 2. 4.4 cm right adnexal simple appearing cyst. 3. Small intramural uterine fibroid noted. Uterus otherwise unremarkable in appearance. Electronically Signed   By: Garald Balding M.D.   On: 12/17/2016 00:12   US Pelvis Complete  Result Date: 12/17/2016 CLINICAL DATA:  Assess large right adnexal cystic lesion seen on CT. Lower abdominal pain, acute onset. EXAM: TRANSABDOMINAL AND TRANSVAGINAL ULTRASOUND OF PELVIS DOPPLER ULTRASOUND OF OVARIES TECHNIQUE: Both transabdominal and transvaginal ultrasound examinations of the pelvis were performed. Transabdominal technique was performed for global imaging of the pelvis including uterus, ovaries, adnexal regions, and pelvic cul-de-sac. It was necessary to proceed with endovaginal exam following the transabdominal exam to visualize the uterus and ovaries in greater detail. Color and duplex Doppler ultrasound was utilized to evaluate blood flow to the ovaries. COMPARISON:  None. FINDINGS: Uterus Measurements: 10.3 x 5.5 x 5.9 cm. A 1.8 cm intramural fibroid is noted at the left anterior uterine wall. Multiple nabothian cysts are noted. Endometrium Thickness: 1.0 cm.  No focal abnormality visualized. Right ovary Not well characterized. There is a 4.4 x 3.7 x 4.2 cm anechoic cyst at the right adnexa, with likely adjacent ovarian tissue. Blood flow at the soft tissue rim is not well characterized due to its location and overlying bowel gas. Left  ovary Measurements: 3.3 x 2.3 x 2.3 cm. Normal appearance/no adnexal mass. Pulsed Doppler evaluation of the left ovary demonstrates normal low-resistance arterial and venous waveforms. Other findings No abnormal free fluid. IMPRESSION: 1. No definite evidence for ovarian torsion. However, the right ovary is not well characterized due to a 4.4 cm right adnexal cyst and overlying structures. 2. 4.4 cm right adnexal simple appearing cyst. 3. Small intramural uterine fibroid noted. Uterus otherwise unremarkable in appearance. Electronically Signed   By: Garald Balding M.D.   On: 12/17/2016 00:12   Ct Abdomen Pelvis W Contrast  Result Date: 12/16/2016 CLINICAL DATA:  Acute onset of generalized abdominal pain. EXAM: CT ABDOMEN AND PELVIS WITH CONTRAST TECHNIQUE: Multidetector CT imaging of the abdomen and pelvis was performed using the standard protocol following bolus administration of intravenous contrast. CONTRAST:  178mL ISOVUE-300 IOPAMIDOL (ISOVUE-300) INJECTION 61% COMPARISON:  CT of the abdomen and pelvis from 04/28/2015, and renal ultrasound performed 05/20/2016 FINDINGS: Lower chest: The visualized lung bases are grossly clear. The visualized portions of the mediastinum are unremarkable. Hepatobiliary: The liver is unremarkable in appearance. The gallbladder is unremarkable in appearance. The common bile duct remains normal in caliber. Pancreas: The pancreas is within normal limits. Spleen: The spleen is unremarkable in appearance. Adrenals/Urinary Tract: Calcifications at the right adrenal gland may reflect remote traumatic injury. The left adrenal gland is unremarkable in appearance. The kidneys are grossly unremarkable in appearance. There is no evidence of hydronephrosis. No renal or ureteral stones are identified. No perinephric stranding is seen. Stomach/Bowel: The stomach is unremarkable in appearance. The small bowel is within normal limits.  The appendix is normal in caliber, without evidence of  appendicitis. The colon is unremarkable in appearance. Vascular/Lymphatic: Scattered calcification is seen along the abdominal aorta and its branches. The abdominal aorta is otherwise grossly unremarkable. The inferior vena cava is grossly unremarkable. No retroperitoneal lymphadenopathy is seen. No pelvic sidewall lymphadenopathy is identified. Reproductive: The bladder is mildly distended and grossly unremarkable. A small uterine fibroid is noted at the fundus. A 4.9 cm cystic focus is noted at the right adnexa. The left ovary is unremarkable in appearance. Other: No additional soft tissue abnormalities are seen. Musculoskeletal: No acute osseous abnormalities are identified. Mild facet disease is noted at the lower lumbar spine. The visualized musculature is unremarkable in appearance. IMPRESSION: 1. No acute abnormality seen within the abdomen or pelvis. 2. 4.9 cm cystic focus at the right adnexa may be physiologic in nature, though pelvic ultrasound would be helpful for further evaluation, when and as deemed clinically appropriate. 3. Small uterine fibroid noted. Aortic Atherosclerosis (ICD10-I70.0). Electronically Signed   By: Garald Balding M.D.   On: 12/16/2016 22:25   [place] Art/ven Flow Abd Pelv Doppler  Result Date: 12/17/2016 CLINICAL DATA:  Assess large right adnexal cystic lesion seen on CT. Lower abdominal pain, acute onset. EXAM: TRANSABDOMINAL AND TRANSVAGINAL ULTRASOUND OF PELVIS DOPPLER ULTRASOUND OF OVARIES TECHNIQUE: Both transabdominal and transvaginal ultrasound examinations of the pelvis were performed. Transabdominal technique was performed for global imaging of the pelvis including uterus, ovaries, adnexal regions, and pelvic cul-de-sac. It was necessary to proceed with endovaginal exam following the transabdominal exam to visualize the uterus and ovaries in greater detail. Color and duplex Doppler ultrasound was utilized to evaluate blood flow to the ovaries. COMPARISON:  None. FINDINGS:  Uterus Measurements: 10.3 x 5.5 x 5.9 cm. A 1.8 cm intramural fibroid is noted at the left anterior uterine wall. Multiple nabothian cysts are noted. Endometrium Thickness: 1.0 cm.  No focal abnormality visualized. Right ovary Not well characterized. There is a 4.4 x 3.7 x 4.2 cm anechoic cyst at the right adnexa, with likely adjacent ovarian tissue. Blood flow at the soft tissue rim is not well characterized due to its location and overlying bowel gas. Left ovary Measurements: 3.3 x 2.3 x 2.3 cm. Normal appearance/no adnexal mass. Pulsed Doppler evaluation of the left ovary demonstrates normal low-resistance arterial and venous waveforms. Other findings No abnormal free fluid. IMPRESSION: 1. No definite evidence for ovarian torsion. However, the right ovary is not well characterized due to a 4.4 cm right adnexal cyst and overlying structures. 2. 4.4 cm right adnexal simple appearing cyst. 3. Small intramural uterine fibroid noted. Uterus otherwise unremarkable in appearance. Electronically Signed   By: Garald Balding M.D.   On: 12/17/2016 00:12    Assessment & Plan:   AUB and pelvic pain. Pt is s/p Endo bx  Routine post-procedure instructions were given to the patient. The patient will follow up to review the results and for further management.    Patient desires surgical management with [place] with bilateral salpingectomy.  The risks of surgery were discussed in detail with the patient including but not limited to: bleeding which may require transfusion or reoperation; infection which may require prolonged hospitalization or re-hospitalization and antibiotic therapy; injury to bowel, bladder, ureters and major vessels or other surrounding organs; need for additional procedures including laparotomy; thromboembolic phenomenon, incisional problems and other postoperative or anesthesia complications.  Patient was told that the likelihood that her condition and symptoms will be treated effectively with this  surgical  management was very high; the postoperative expectations were also discussed in detail. The patient also understands the alternative treatment options which were discussed in full. All questions were answered.  She was told that she will be contacted by our surgical scheduler regarding the time and date of her surgery; routine preoperative instructions of having nothing to eat or drink after midnight on the day prior to surgery and also coming to the hospital 1 1/2 hours prior to her time of surgery were also emphasized.  She was told she may be called for a preoperative appointment about a week prior to surgery and will be given further preoperative instructions at that visit. Printed patient education handouts about the procedure were given to the patient to review at home.  Mishka Stegemann L. Harraway-Smith, M.D., [facility]nn June

## 2017-01-11 NOTE — Patient Instructions (Signed)
Total Laparoscopic Hysterectomy, Care After Refer to this sheet in the next few weeks. These instructions provide you with information on caring for yourself after your procedure. Your health care provider may also give you more specific instructions. Your treatment has been planned according to current medical practices, but problems sometimes occur. Call your health care provider if you have any problems or questions after your procedure. What can I expect after the procedure?  Pain and bruising at the incision sites. You will be given pain medicine to control it.  Menopausal symptoms such as hot flashes, night sweats, and insomnia if your ovaries were removed.  Sore throat from the breathing tube that was inserted during surgery. Follow these instructions at home:  Only take over-the-counter or prescription medicines for pain, discomfort, or fever as directed by your health care provider.  Do not take aspirin. It can cause bleeding.  Do not drive when taking pain medicine.  Follow your health care provider's advice regarding diet, exercise, lifting, driving, and general activities.  Resume your usual diet as directed and allowed.  Get plenty of rest and sleep.  Do not douche, use tampons, or have sexual intercourse for at least 6 weeks, or until your health care provider gives you permission.  Change your bandages (dressings) as directed by your health care provider.  Monitor your temperature and notify your health care provider of a fever.  Take showers instead of baths for 2-3 weeks.  Do not drink alcohol until your health care provider gives you permission.  If you develop constipation, you may take a mild laxative with your health care provider's permission. Bran foods may help with constipation problems. Drinking enough fluids to keep your urine clear or pale yellow may help as well.  Try to have someone home with you for 1-2 weeks to help around the house.  Keep all of  your follow-up appointments as directed by your health care provider. Contact a health care provider if:  You have swelling, redness, or increasing pain around your incision sites.  You have pus coming from your incision.  You notice a bad smell coming from your incision.  Your incision breaks open.  You feel dizzy or lightheaded.  You have pain or bleeding when you urinate.  You have persistent diarrhea.  You have persistent nausea and vomiting.  You have abnormal vaginal discharge.  You have a rash.  You have any type of abnormal reaction or develop an allergy to your medicine.  You have poor pain control with your prescribed medicine. Get help right away if:  You have chest pain or shortness of breath.  You have severe abdominal pain that is not relieved with pain medicine.  You have pain or swelling in your legs. This information is not intended to replace advice given to you by your health care provider. Make sure you discuss any questions you have with your health care provider. Document Released: 10/10/2012 Document Revised: 05/28/2015 Document Reviewed: 07/10/2012 Elsevier Interactive Patient Education  2017 [place]. Hysterectomy Information A hysterectomy is a surgery to remove your uterus. After surgery, you will no longer have periods. Also, you will not be able to get pregnant. Reasons for this surgery  You have bleeding that is not normal and keeps coming back.  You have lasting (chronic) lower belly (pelvic) pain.  You have a lasting infection.  The lining of your uterus grows outside your uterus.  The lining of your uterus grows in the muscle of your uterus.  Your uterus falls down into your vagina.  You have a growth in your uterus that causes problems.  You have cells that could turn into cancer (precancerous cells).  You have cancer of the uterus or cervix. Types There are 3 types of hysterectomies. Depending on the type, the surgery  will:  Remove the top part of the uterus only.  Remove the uterus and the cervix.  Remove the uterus, cervix, and tissue that holds the uterus in place in the lower belly.  Ways a hysterectomy can be performed There are 5 ways this surgery can be performed.  A cut (incision) is made in the belly (abdomen). The uterus is taken out through the cut.  A cut is made in the vagina. The uterus is taken out through the cut.  Three or four cuts are made in the belly. A surgical device with a camera is put through one of the cuts. The uterus is cut into small pieces. The uterus is taken out through the cuts or the vagina.  Three or four cuts are made in the belly. A surgical device with a camera is put through one of the cuts. The uterus is taken out through the vagina.  Three or four cuts are made in the belly. A surgical device that is controlled by a computer makes a visual image. The device helps the surgeon control the surgical tools. The uterus is cut into small pieces. The pieces are taken out through the cuts or through the vagina.  What can I expect after the surgery?  You will be given pain medicine.  You will need help at home for 3-5 days after surgery.  You will need to see your doctor in 2-4 weeks after surgery.  You may get hot flashes, have night sweats, and have trouble sleeping.  You may need to have Pap tests in the future if your surgery was related to cancer. Talk to your doctor. It is still good to have regular exams. This information is not intended to replace advice given to you by your health care provider. Make sure you discuss any questions you have with your health care provider. Document Released: 03/14/2011 Document Revised: 05/28/2015 Document Reviewed: 08/27/2012 Elsevier Interactive Patient Education  [employer].

## 2017-01-12 ENCOUNTER — Encounter (HOSPITAL_COMMUNITY): Payer: Self-pay

## 2017-01-17 ENCOUNTER — Telehealth: Payer: Self-pay

## 2017-01-17 NOTE — Telephone Encounter (Signed)
Patient called and made aware that her endometrial biopsy was negative. Patient made aware that Dr. Ihor Dow states she should proceed with the pre op plans as they talked about. Patient states understanding. [name] RNBSN

## 2017-02-22 ENCOUNTER — Ambulatory Visit (INDEPENDENT_AMBULATORY_CARE_PROVIDER_SITE_OTHER): Payer: Managed Care, Other (non HMO) | Admitting: Obstetrics & Gynecology

## 2017-02-22 ENCOUNTER — Encounter: Payer: Self-pay | Admitting: Obstetrics & Gynecology

## 2017-02-22 VITALS — BP 156/85 | HR 71 | Ht 63.0 in | Wt 198.0 lb

## 2017-02-22 DIAGNOSIS — N183 Chronic kidney disease, stage 3 unspecified: Secondary | ICD-10-CM

## 2017-02-22 DIAGNOSIS — N939 Abnormal uterine and vaginal bleeding, unspecified: Secondary | ICD-10-CM | POA: Diagnosis not present

## 2017-02-22 DIAGNOSIS — E119 Type 2 diabetes mellitus without complications: Secondary | ICD-10-CM

## 2017-02-22 NOTE — Patient Instructions (Signed)
Total Laparoscopic Hysterectomy A total laparoscopic hysterectomy is a minimally invasive surgery to remove your uterus and cervix. This surgery is performed by making several small cuts (incisions) in your abdomen. It can also be done with a thin, lighted tube (laparoscope) inserted into two small incisions in your lower abdomen. Your fallopian tubes and ovaries can be removed (bilateral salpingo-oophorectomy) during this surgery as well.Benefits of minimally invasive surgery include:  Less pain.  Less risk of blood loss.  Less risk of infection.  Quicker return to normal activities.  Tell a health care provider about:  Any allergies you have.  All medicines you are taking, including vitamins, herbs, eye drops, creams, and over-the-counter medicines.  Any problems you or family members have had with anesthetic medicines.  Any blood disorders you have.  Any surgeries you have had.  Any medical conditions you have. What are the risks? Generally, this is a safe procedure. However, as with any procedure, complications can occur. Possible complications include:  Bleeding.  Blood clots in the legs or lung.  Infection.  Injury to surrounding organs.  Problems with anesthesia.  Early menopause symptoms (hot flashes, night sweats, insomnia).  Risk of conversion to an open abdominal incision.  What happens before the procedure?  Ask your health care provider about changing or stopping your regular medicines.  Do not take aspirin or blood thinners (anticoagulants) for 1 week before the surgery or as told by your health care provider.  Do not eat or drink anything for 8 hours before the surgery or as told by your health care provider.  Quit smoking if you smoke.  Arrange for a ride home after surgery and for someone to help you at home during recovery. What happens during the procedure?  You will be given antibiotic medicine.  An IV tube will be placed in your arm. You  will be given medicine to make you sleep (general anesthetic).  A gas (carbon dioxide) will be used to inflate your abdomen. This will allow your surgeon to look inside your abdomen, perform your surgery, and treat any other problems found if necessary.  Three or four small incisions (often less than 1/2 inch) will be made in your abdomen. One of these incisions will be made in the area of your belly button (navel). The laparoscope will be inserted into the incision. Your surgeon will look through the laparoscope while doing your procedure.  Other surgical instruments will be inserted through the other incisions.  Your uterus may be removed through your vagina or cut into small pieces and removed through the small incisions.  Your incisions will be closed. What happens after the procedure?  The gas will be released from inside your abdomen.  You will be taken to the recovery area where a nurse will watch and check your progress. Once you are awake, stable, and taking fluids well, without other problems, you will return to your room or be allowed to go home.  There is usually minimal discomfort following the surgery because the incisions are so small.  You will be given pain medicine while you are in the hospital and for when you go home. This information is not intended to replace advice given to you by your health care provider. Make sure you discuss any questions you have with your health care provider. Document Released: 10/17/2006 Document Revised: 05/28/2015 Document Reviewed: 07/10/2012 Elsevier Interactive Patient Education  2017 Else[employer] Inc. Total Laparoscopic Hysterectomy, Care After Refer to this sheet in the next few   weeks. These instructions provide you with information on caring for yourself after your procedure. Your health care provider may also give you more specific instructions. Your treatment has been planned according to current medical practices, but problems sometimes  occur. Call your health care provider if you have any problems or questions after your procedure. What can I expect after the procedure?  Pain and bruising at the incision sites. You will be given pain medicine to control it.  Menopausal symptoms such as hot flashes, night sweats, and insomnia if your ovaries were removed.  Sore throat from the breathing tube that was inserted during surgery. Follow these instructions at home:  Only take over-the-counter or prescription medicines for pain, discomfort, or fever as directed by your health care provider.  Do not take aspirin. It can cause bleeding.  Do not drive when taking pain medicine.  Follow your health care provider's advice regarding diet, exercise, lifting, driving, and general activities.  Resume your usual diet as directed and allowed.  Get plenty of rest and sleep.  Do not douche, use tampons, or have sexual intercourse for at least 6 weeks, or until your health care provider gives you permission.  Change your bandages (dressings) as directed by your health care provider.  Monitor your temperature and notify your health care provider of a fever.  Take showers instead of baths for 2-3 weeks.  Do not drink alcohol until your health care provider gives you permission.  If you develop constipation, you may take a mild laxative with your health care provider's permission. Bran foods may help with constipation problems. Drinking enough fluids to keep your urine clear or pale yellow may help as well.  Try to have someone home with you for 1-2 weeks to help around the house.  Keep all of your follow-up appointments as directed by your health care provider. Contact a health care provider if:  You have swelling, redness, or increasing pain around your incision sites.  You have pus coming from your incision.  You notice a bad smell coming from your incision.  Your incision breaks open.  You feel dizzy or  lightheaded.  You have pain or bleeding when you urinate.  You have persistent diarrhea.  You have persistent nausea and vomiting.  You have abnormal vaginal discharge.  You have a rash.  You have any type of abnormal reaction or develop an allergy to your medicine.  You have poor pain control with your prescribed medicine. Get help right away if:  You have chest pain or shortness of breath.  You have severe abdominal pain that is not relieved with pain medicine.  You have pain or swelling in your legs. This information is not intended to replace advice given to you by your health care provider. Make sure you discuss any questions you have with your health care provider. Document Released: 10/10/2012 Document Revised: 05/28/2015 Document Reviewed: 07/10/2012 Elsevier Interactive Patient Education  2017 Elsevier Inc.  

## 2017-02-22 NOTE — Progress Notes (Signed)
hgba1c History:  47 y.o. [id number] here today for f/u of AUB and pre op counseling. Pt is scheduled for a RATH with unilateral salpingectomy. Pt is s/p SVD x 3 but, has had mult prev abd surgeries including a laparotomy for ectopic pregnancy and a laparotomy for ov cystectomy. Pt is also s/p a laparoscopy to check for flow of the fallopian tube with a subsequent tubal ligation.  She is a diabetic but, reports very good control recently with fasting ~70-80 and postprandial <100-120.   The following portions of the patient's history were reviewed and updated as appropriate: allergies, current medications, past family history, past medical history, past social history, past surgical history and problem list.  Review of Systems:  Pertinent items are noted in HPI.   Objective:  Physical Exam Height 5\' 3"  (1.6 m), weight 198 lb (89.8 kg). Pt in with her husband CONSTITUTIONAL: Well-developed, well-nourished female in no acute distress.  HENT:  Normocephalic, atraumatic EYES: Conjunctivae and EOM are normal. No scleral icterus.  NECK: Normal range of motion SKIN: Skin is warm and dry. No rash noted. Not diaphoretic.No pallor. [place]: Alert and oriented to person, place, and time. Normal coordination.   Labs and Imaging 01/11/2017 Diagnosis Endometrium, biopsy - ENDOMETRIOID TYPE POLYP(S). - BENIGN SQUAMOUS MUCOSA. - THERE IS NO EVIDENCE OF HYPERPLASIA OR MALIGNANCY.  Assessment & Plan:  AUB DM- will obtain a HgbA1C preop  Patient desires surgical management with [place] with unilateral salpingectomy.   The risks of surgery were discussed in detail with the patient including but not limited to: bleeding which may require transfusion or reoperation; infection which may require prolonged hospitalization or re-hospitalization and antibiotic therapy; injury to bowel, bladder, ureters and major vessels or other surrounding organs; need for additional procedures including laparotomy; thromboembolic  phenomenon, incisional problems and other postoperative or anesthesia complications.  Patient was told that the likelihood that her condition and symptoms will be treated effectively with this surgical management was very high; the postoperative expectations were also discussed in detail. The patient also understands the alternative treatment options which were discussed in full. All questions were answered.  She was told she may be called for a preoperative appointment about a week prior to surgery and will be given further preoperative instructions at that visit. Printed patient education handouts about the procedure were given to the patient to review at home.  All of her questions were answered.   Total face-to-face time with patient was 30 min.  Greater than 50% was spent in counseling and coordination of care with the patient.   Anasophia Pecor L. Harraway-Smith, M.D., Cherlynn June

## 2017-02-23 ENCOUNTER — Encounter (HOSPITAL_COMMUNITY): Payer: Self-pay | Admitting: *Deleted

## 2017-02-23 LAB — HEMOGLOBIN A1C
Est. average glucose Bld gHb Est-mCnc: 174 mg/dL
HEMOGLOBIN A1C: 7.7 % — AB (ref 4.8–5.6)

## 2017-03-06 ENCOUNTER — Telehealth: Payer: Self-pay

## 2017-03-06 NOTE — Telephone Encounter (Signed)
Patient called and made aware of My chart message that was sent to her.   Patient states that she did read it and states understanding. [name] RNBSN

## 2017-03-07 DIAGNOSIS — N83201 Unspecified ovarian cyst, right side: Secondary | ICD-10-CM | POA: Diagnosis present

## 2017-03-07 DIAGNOSIS — D219 Benign neoplasm of connective and other soft tissue, unspecified: Secondary | ICD-10-CM | POA: Diagnosis present

## 2017-03-15 ENCOUNTER — Encounter (HOSPITAL_BASED_OUTPATIENT_CLINIC_OR_DEPARTMENT_OTHER): Payer: Self-pay | Admitting: Emergency Medicine

## 2017-03-15 ENCOUNTER — Other Ambulatory Visit: Payer: Self-pay

## 2017-03-15 ENCOUNTER — Emergency Department (HOSPITAL_BASED_OUTPATIENT_CLINIC_OR_DEPARTMENT_OTHER)
Admission: EM | Admit: 2017-03-15 | Discharge: 2017-03-15 | Disposition: A | Discharge status: Home / Self Care | Payer: Managed Care, Other (non HMO) | Attending: Emergency Medicine | Admitting: Emergency Medicine

## 2017-03-15 DIAGNOSIS — N183 Chronic kidney disease, stage 3 (moderate): Secondary | ICD-10-CM | POA: Insufficient documentation

## 2017-03-15 DIAGNOSIS — F17210 Nicotine dependence, cigarettes, uncomplicated: Secondary | ICD-10-CM | POA: Diagnosis not present

## 2017-03-15 DIAGNOSIS — E1122 Type 2 diabetes mellitus with diabetic chronic kidney disease: Secondary | ICD-10-CM | POA: Diagnosis not present

## 2017-03-15 DIAGNOSIS — I129 Hypertensive chronic kidney disease with stage 1 through stage 4 chronic kidney disease, or unspecified chronic kidney disease: Secondary | ICD-10-CM | POA: Diagnosis not present

## 2017-03-15 DIAGNOSIS — J45909 Unspecified asthma, uncomplicated: Secondary | ICD-10-CM | POA: Insufficient documentation

## 2017-03-15 DIAGNOSIS — Z79899 Other long term (current) drug therapy: Secondary | ICD-10-CM | POA: Diagnosis not present

## 2017-03-15 DIAGNOSIS — H5712 Ocular pain, left eye: Secondary | ICD-10-CM | POA: Diagnosis not present

## 2017-03-15 DIAGNOSIS — Z794 Long term (current) use of insulin: Secondary | ICD-10-CM | POA: Insufficient documentation

## 2017-03-15 MED ORDER — TRAMADOL HCL 50 MG PO TABS: 50.0000 mg | ORAL_TABLET | Freq: Four times a day (QID) | ORAL | 0 refills | Status: DC | PRN

## 2017-03-15 MED ORDER — FLUORESCEIN SODIUM 1 MG OP STRP
1.0000 | ORAL_STRIP | Freq: Once | OPHTHALMIC | Status: AC
  Administered 2017-03-15: 1 via OPHTHALMIC
  Filled 2017-03-15: qty 1

## 2017-03-15 MED ORDER — CEPHALEXIN 500 MG PO CAPS: ORAL_CAPSULE | ORAL | 0 refills | Status: DC

## 2017-03-15 MED ORDER — TETRACAINE HCL 0.5 % OP SOLN
2.0000 [drp] | Freq: Once | OPHTHALMIC | Status: AC
  Administered 2017-03-15: 2 [drp] via OPHTHALMIC
  Filled 2017-03-15: qty 4

## 2017-03-15 NOTE — ED Triage Notes (Signed)
Pt c/o LT eye irritation that started Mon pm; LT side facial swelling since yesterday

## 2017-03-15 NOTE — ED Notes (Signed)
Pt has multiple questions regarding her paperwork and diagnosis, asked EDP to go back in and answer her questions.  Pt given paperwork from nurse and signed on computer, denies any further needs from nurse at this time

## 2017-03-15 NOTE — ED Provider Notes (Addendum)
[facility] EMERGENCY DEPARTMENT Provider Note   CSN: [id number] Arrival date & time: 03/15/17  1942     History   Chief Complaint Chief Complaint  Patient presents with  . Eye Problem    HPI Renee James is a 47 y.o. female.  HPI   47 year old female with history of diabetes, diabetic neuropathy, Bell's palsy, anxiety presenting with eye complaint.  Patient report yesterday morning she woke up with her left eye crusted over.  Throughout the day she endorsed discomfort in the left eye, felt like a grain of sand is in there.  Was teary.  Symptom persisted throughout the day today now she is having pain to the left side of face near her left ear and down the neck.  Pain is sharp, tender to palpation and moderate in severity.  No associated fever, chills, diplopia, URI symptoms, ear pain, loss of hearing, dental pain, trouble swallowing, chest pain or shortness of breath.  She did try using Visine eyedrops as well as some left over erythromycin ointment without relief.  She does not wear contact lens.  She is up-to-date with immunization.  Past Medical History:  Diagnosis Date  . Allergy   . Anxiety   . Arthritis   . Asthma   . Bell's palsy   . Chronic kidney disease (CKD), stage III (moderate) (HCC)   . Depression   . Diabetes mellitus without complication ([place])   . Diabetic neuropathy ([place])   . Hyperlipidemia   . Hypertension   . Pneumonia   . Renal insufficiency 05/13/2015    Patient Active Problem List   Diagnosis Date Noted  . Right ovarian cyst 03/07/2017  . Fibroids 03/07/2017  . Abnormal uterine bleeding (AUB) 02/22/2017  . CKD (chronic kidney disease) stage 3, GFR 30-59 ml/min (HCC) 05/16/2016  . Renal insufficiency 05/13/2015  . Ovarian torsion 04/28/2015  . Pain   . Hyperlipidemia 03/19/2015  . Obesity, unspecified 03/19/2015  . Controlled type 2 diabetes mellitus without complication, without long-term current use of insulin ([place]) 03/19/2015  .  Essential hypertension 03/19/2015  . GAD (generalized anxiety disorder) 03/19/2015    Past Surgical History:  Procedure Laterality Date  . ECTOPIC PREGNANCY SURGERY    . LAPAROSCOPY N/A 04/28/2015   Procedure: LAPAROSCOPY DIAGNOSTIC;  Surgeon: Donnamae Jude, MD;  Location: [facility];  Service: Gynecology;  Laterality: N/A;  . OVARIAN CYST REMOVAL    . TUBAL LIGATION      OB History    Gravida Para Term Preterm AB Living   _0 SAB TAB Ectopic Multiple Live Births     1 1           Home Medications    Prior to Admission medications   Medication Sig Start Date End Date Taking? Authorizing Provider  albuterol (PROVENTIL HFA;VENTOLIN HFA) 108 (90 Base) MCG/ACT inhaler Inhale 1-2 puffs into the lungs every 6 (six) hours as needed for wheezing or shortness of breath. 08/29/16   Copland, Gay Filler, MD  Blood Glucose Monitoring Suppl ([place]) w/Device KIT 1 kit by Does not apply route as directed. USE AS DIRECTED TO TEST BLOOD SUGARS DAILY DX: E11.9 Patient not taking: Reported on 03/13/2017 03/04/16   Saguier, Percell Miller, PA-C  citalopram (CELEXA) 20 MG tablet Take 1 tablet (20 mg total) by mouth daily. Patient not taking: Reported on 03/13/2017 08/29/16   Copland, Gay Filler, MD  dextromethorphan-guaiFENesin (ROBITUSSIN-DM) 10-100 MG/5ML liquid Take  10 mLs by mouth every 4 (four) hours as needed for cough. Patient not taking: Reported on 03/13/2017 10/12/16   Degele, Jenne Pane, MD  ergocalciferol (VITAMIN D2) 50000 units capsule Take 50,000 Units by mouth every Monday.    [provider]  glucose blood (ONETOUCH VERIO) test strip USE AS DIRECTED TO TEST BLOOD SUGARS DAILY DX: E11.9 Patient not taking: Reported on 03/13/2017 04/13/16   Copland, Gay Filler, MD  hydrochlorothiazide (HYDRODIURIL) 25 MG tablet Take 1 tablet (25 mg total) by mouth daily. Patient not taking: Reported on 03/13/2017 12/14/15   Copland, Gay Filler, MD  Insulin Detemir (LEVEMIR) 100 UNIT/ML Pen  Inject 10 Units into the skin daily at 10 pm. Check your morning blood sugar every day. If morning blood sugars aren't at goal (100), then increase the amount of evening levemir insulin by 1 unit every night until fasting blood sugars  goal of less than 100 is reached. Patient taking differently: Inject 24-25 Units into the skin 2 (two) times daily. Check your morning blood sugar every day. If morning blood sugars aren't at goal (100), then increase the amount of evening levemir insulin by 1 unit every night until fasting blood sugars  goal of less than 100 is reached. 03/07/16   Saguier, Percell Miller, PA-C  Lancets [facility] ULTRASOFT) lancets USE AS DIRECTED TO TEST BLOOD SUGARS DAILY DX: E11.9 Patient not taking: Reported on 03/13/2017 03/07/16   Saguier, Percell Miller, PA-C  losartan (COZAAR) 100 MG tablet Take 1 tablet (100 mg total) by mouth daily. 04/13/16   Copland, Gay Filler, MD  medroxyPROGESTERone (PROVERA) 10 MG tablet Take 2 tablets (20 mg total) by mouth daily. Patient not taking: Reported on 03/13/2017 02/17/16   Truett Mainland, DO  metFORMIN (GLUCOPHAGE-XR) 500 MG 24 hr tablet Take 4 tablets (2,000 mg total) by mouth daily. 07/15/16   Copland, Gay Filler, MD  metroNIDAZOLE (METROGEL) 0.75 % vaginal gel Place 1 Applicatorful vaginally at bedtime. Insert 5 grams of gel to vagina once daily for 5 days. Patient not taking: Reported on 03/13/2017 10/20/16   Lance Sell, NP  Nystatin POWD Apply to skin lesions 2 to 3 times a day Patient not taking: Reported on 03/13/2017 10/12/16   Degele, Jenne Pane, MD  pioglitazone (ACTOS) 30 MG tablet Take 30 mg by mouth daily.    [provider]  pravastatin (PRAVACHOL) 20 MG tablet Take 20 mg by mouth daily.     [provider]  pregabalin (LYRICA) 75 MG capsule Take 75 mg by mouth daily as needed (for pain).     [provider]  torsemide (DEMADEX) 20 MG tablet Take 20 mg by mouth daily.     [provider]  traMADol (ULTRAM) 50 MG  tablet Take 1 tablet (50 mg total) by mouth every 6 (six) hours as needed. Patient taking differently: Take 50 mg by mouth every 6 (six) hours as needed for moderate pain.  12/17/16   Drenda Freeze, MD    Family History Family History  Problem Relation Age of Onset  . Diabetes Mother   . Heart disease Mother   . Kidney failure Mother   . Stroke Father   . Kidney failure Father   . Breast cancer Unknown     Social History Social History   Tobacco Use  . Smoking status: Current Every Day Smoker    Packs/day: 0.50    Types: Cigarettes  . Smokeless tobacco: Never Used  Substance Use Topics  . Alcohol use:  Yes    Comment: occ  . Drug use: No     Allergies   Latex; Talc; Ace inhibitors; and Other   Review of Systems Review of Systems  HENT: Positive for facial swelling. Negative for dental problem.   Eyes: Positive for pain and discharge. Negative for photophobia, redness and visual disturbance.     Physical Exam Updated Vital Signs BP 136/81   Pulse 79   Temp 98.3 F (36.8 C) (Oral)   Resp 18   Ht _0  (1.6 m)   Wt 89.8 kg (198 lb)   LMP 03/01/2017   SpO2 99%   BMI 35.07 kg/m   Physical Exam  Constitutional: She appears well-developed and well-nourished. No distress.  HENT:  Head: Atraumatic.  Right Ear: External ear normal.  Left Ear: External ear normal.  Tenderness along the left side of face on gentle palpation without edema, erythema, warmth, or abscess.  No evidence of lymphangitis.  Eyes: Conjunctivae and EOM are normal. Pupils are equal, round, and reactive to light. Right eye exhibits no discharge. Left eye exhibits no discharge. Right conjunctiva is not injected. Right conjunctiva has no hemorrhage. Left conjunctiva is not injected. Left conjunctiva has no hemorrhage. No scleral icterus.  Slit lamp exam:      The left eye shows no corneal abrasion, no corneal flare, no corneal ulcer, no foreign body, no hyphema, no hypopyon and no fluorescein  uptake.  IOP: 21 on L eye  Neck: Normal range of motion. Neck supple.  No nuchal rigidity  Neurological: She is alert.  Skin: No rash noted.  Psychiatric: She has a normal mood and affect.  Nursing note and vitals reviewed.    ED Treatments / Results  Labs (all labs ordered are listed, but only abnormal results are displayed) Labs Reviewed - No data to display  EKG  EKG Interpretation None       Radiology No results found.  Procedures Procedures (including critical care time)  20:37:52 Visual Acuity SG  Visual Acuity  R Near: 20  R Distance: 25  L Near: 20  L Distance: 20     Medications Ordered in ED Medications  tetracaine (PONTOCAINE) 0.5 % ophthalmic solution 2 drop (2 drops Right Eye Given by Other 03/15/17 2141)  fluorescein ophthalmic strip 1 strip (1 strip Left Eye Given by Other 03/15/17 2141)     Initial Impression / Assessment and Plan / ED Course  I have reviewed the triage vital signs and the nursing notes.  Pertinent labs & imaging results that were available during my care of the patient were reviewed by me and considered in my medical decision making (see chart for details).     BP 136/81   Pulse 79   Temp 98.3 F (36.8 C) (Oral)   Resp 18   Ht _1  (1.6 m)   Wt 89.8 kg (198 lb)   LMP 03/01/2017   SpO2 99%   BMI 35.07 kg/m    Final Clinical Impressions(s) / ED Diagnoses   Final diagnoses:  Left eye pain    ED Discharge Orders        Ordered    cephALEXin (KEFLEX) 500 MG capsule     03/15/17 2210    traMADol (ULTRAM) 50 MG tablet  Every 6 hours PRN     03/15/17 2210     Patient report having left-sided eye discomfort and facial pain.  History of Bell's palsy in the past.  No evidence of Bell's at this  time.  Will perform thorough eye examination  10:07 PM Normal visual acuity.  Eye was stained with fluorescein and no evidence of corneal abrasion, foreign body, ulceration, or other concerning feature.  Normal extraocular  movement.  Normal intraocular pressure.  At this time I have not identified any vision impairing disease process.  Left eye is mildly injected.  Since patient complaining of left-sided facial discomfort after her eye discomfort.  This could be early signs of cellulitis.  No findings to suggest orbital cellulitis. Normal IOP, doubt acute angle glaucoma or iritis/uveitis. We will preemptively prescribe Keflex for potential cellulitis.  Recommend Tylenol as needed for pain.  Ophthalmology referral given as needed.  Return precautions discussed. In order to decrease risk of narcotic abuse. Pt's record were checked using the [place] Controlled Substance database.        Domenic Moras, PA-C 03/15/17 2224    Julianne Rice, MD 03/18/17 1324

## 2017-03-15 NOTE — Discharge Instructions (Signed)
You have been evaluated for eye pain and facial pain.  If you notice redness, warmth or increase swelling to your face, take keflex as prescribed for the full duration.  Take tramadol as needed for pain.  You may continue to use your erythromycin ointment if you notice redness in your left eye.  Follow up with your doctor for further care.

## 2017-03-15 NOTE — ED Notes (Addendum)
Pt's left without talking to provider to clarify medication, nurse attempted to explain as much as possible

## 2017-03-15 NOTE — ED Notes (Signed)
With glasses

## 2017-03-17 NOTE — Patient Instructions (Addendum)
Renee James  03/17/2017   Your procedure is scheduled on: Tuesday 03/28/2017   Report to [facility] Main  Entrance              Report to admitting at  1030 AM   Call this number if you have problems the morning of surgery [phone]   How to Manage Your Diabetes Before and After Surgery  Why is it important to control my blood sugar before and after surgery? . Improving blood sugar levels before and after surgery helps healing and can limit problems. . A way of improving blood sugar control is eating a healthy diet by: o  Eating less sugar and carbohydrates o  Increasing activity/exercise o  Talking with your doctor about reaching your blood sugar goals . High blood sugars (greater than 180 mg/dL) can raise your risk of infections and slow your recovery, so you will need to focus on controlling your diabetes during the weeks before surgery. . Make sure that the doctor who takes care of your diabetes knows about your planned surgery including the date and location.  How do I manage my blood sugar before surgery? . Check your blood sugar at least 4 times a day, starting 2 days before surgery, to make sure that the level is not too high or low. o Check your blood sugar the morning of your surgery when you wake up and every 2 hours until you get to the Short Stay unit. . If your blood sugar is less than 70 mg/dL, you will need to treat for low blood sugar: o Do not take insulin. o Treat a low blood sugar (less than 70 mg/dL) with  cup of clear juice (cranberry or apple), 4 glucose tablets, OR glucose gel. o Recheck blood sugar in 15 minutes after treatment (to make sure it is greater than 70 mg/dL). If your blood sugar is not greater than 70 mg/dL on recheck, call [phone] for further instructions. . Report your blood sugar to the short stay nurse when you get to Short Stay.  . If you are admitted to the hospital after surgery: o Your blood sugar will be  checked by the staff and you will probably be given insulin after surgery (instead of oral diabetes medicines) to make sure you have good blood sugar levels. o The goal for blood sugar control after surgery is 80-180 mg/dL.   WHAT DO I DO ABOUT MY DIABETES MEDICATION?  The day before surgery on Monday 03/27/2017, take Metformin as usual.  Take Actos as usual the day before surgery on Monday 03/27/2017.   Marland Kitchen Do not take oral diabetes medicines (pills) the morning of surgery.  . THE NIGHT BEFORE SURGERY, take  12   units of  Levemir     insulin.       . THE MORNING OF SURGERY, take 12  units of   Levemir      insulin.       Remember: Do not eat food :After Midnight.May have clear liquids from midnight up until 0700 am then nothing until after surgery!     CLEAR LIQUID DIET   Foods Allowed  Foods Excluded  Coffee and tea, regular and decaf                             liquids that you cannot  Plain Jell-O in any flavor                                             see through such as: Fruit ices (not with fruit pulp)                                     milk, soups, orange juice  Iced Popsicles                                    All solid food Carbonated beverages, regular and diet                                    Cranberry, grape and apple juices Sports drinks like Gatorade Lightly seasoned clear broth or consume(fat free) Sugar, honey syrup  Sample Menu Breakfast                                Lunch                                     Supper Cranberry juice                    Beef broth                            Chicken broth Jell-O                                     Grape juice                           Apple juice Coffee or tea                        Jell-O                                      Popsicle                                                Coffee or tea                        Coffee or  tea  _____________________________________________________________________     Take these medicines the morning of surgery with A SIP OF WATER: Lyrica if needed, use Albuterol inhaler if  needed and bring it with you to the hospital   DO NOT Avis may not have any metal on your body including hair pins and              piercings  Do not wear jewelry, make-up, lotions, powders or perfumes, deodorant             Do not wear nail polish.  Do not shave  48 hours prior to surgery.              Men may shave face and neck.   Do not bring valuables to the hospital. [place].  Contacts, dentures or bridgework may not be worn into surgery.  Leave suitcase in the car. After surgery it may be brought to your room.     Patients discharged the day of surgery will not be allowed to drive home.  Name and phone number of your driver:  Special Instructions: N/A              Please read over the following fact sheets you were given: _____________________________________________________________________             [facility] - Preparing for Surgery Before surgery, you can play an important role.  Because skin is not sterile, your skin needs to be as free of germs as possible.  You can reduce the number of germs on your skin by washing with CHG (chlorahexidine gluconate) soap before surgery.  CHG is an antiseptic cleaner which kills germs and bonds with the skin to continue killing germs even after washing. Please DO NOT use if you have an allergy to CHG or antibacterial soaps.  If your skin becomes reddened/irritated stop using the CHG and inform your nurse when you arrive at Short Stay. Do not shave (including legs and underarms) for at least 48 hours prior to the first CHG shower.  You may shave your face/neck. Please follow these instructions carefully:  1.  Shower with  CHG Soap the night before surgery and the  morning of Surgery.  2.  If you choose to wash your hair, wash your hair first as usual with your  normal  shampoo.  3.  After you shampoo, rinse your hair and body thoroughly to remove the  shampoo.                           4.  Use CHG as you would any other liquid soap.  You can apply chg directly  to the skin and wash                       Gently with a scrungie or clean washcloth.  5.  Apply the CHG Soap to your body ONLY FROM THE NECK DOWN.   Do not use on face/ open                           Wound or open sores. Avoid contact with eyes, ears mouth and genitals (private parts).  Wash face,  Genitals (private parts) with your normal soap.             6.  Wash thoroughly, paying special attention to the area where your surgery  will be performed.  7.  Thoroughly rinse your body with warm water from the neck down.  8.  DO NOT shower/wash with your normal soap after using and rinsing off  the CHG Soap.                9.  Pat yourself dry with a clean towel.            10.  Wear clean pajamas.            11.  Place clean sheets on your bed the night of your first shower and do not  sleep with pets. Day of Surgery : Do not apply any lotions/deodorants the morning of surgery.  Please wear clean clothes to the hospital/surgery center.  FAILURE TO FOLLOW THESE INSTRUCTIONS MAY RESULT IN THE CANCELLATION OF YOUR SURGERY PATIENT SIGNATURE_________________________________  NURSE SIGNATURE__________________________________  ________________________________________________________________________   Adam Phenix  An incentive spirometer is a tool that can help keep your lungs clear and active. This tool measures how well you are filling your lungs with each breath. Taking long deep breaths may help reverse or decrease the chance of developing breathing (pulmonary) problems (especially infection) following:  A long period of  time when you are unable to move or be active. BEFORE THE PROCEDURE   If the spirometer includes an indicator to show your best effort, your nurse or respiratory therapist will set it to a desired goal.  If possible, sit up straight or lean slightly forward. Try not to slouch.  Hold the incentive spirometer in an upright position. INSTRUCTIONS FOR USE  1. Sit on the edge of your bed if possible, or sit up as far as you can in bed or on a chair. 2. Hold the incentive spirometer in an upright position. 3. Breathe out normally. 4. Place the mouthpiece in your mouth and seal your lips tightly around it. 5. Breathe in slowly and as deeply as possible, raising the piston or the ball toward the top of the column. 6. Hold your breath for 3-5 seconds or for as long as possible. Allow the piston or ball to fall to the bottom of the column. 7. Remove the mouthpiece from your mouth and breathe out normally. 8. Rest for a few seconds and repeat Steps 1 through 7 at least 10 times every 1-2 hours when you are awake. Take your time and take a few normal breaths between deep breaths. 9. The spirometer may include an indicator to show your best effort. Use the indicator as a goal to work toward during each repetition. 10. After each set of 10 deep breaths, practice coughing to be sure your lungs are clear. If you have an incision (the cut made at the time of surgery), support your incision when coughing by placing a pillow or rolled up towels firmly against it. Once you are able to get out of bed, walk around indoors and cough well. You may stop using the incentive spirometer when instructed by your caregiver.  RISKS AND COMPLICATIONS  Take your time so you do not get dizzy or light-headed.  If you are in pain, you may need to take or ask for pain medication before doing incentive spirometry. It is harder to take a deep breath if you are having pain.  AFTER USE  Rest and breathe slowly and easily.  It can  be helpful to keep track of a log of your progress. Your caregiver can provide you with a simple table to help with this. If you are using the spirometer at home, follow these instructions: [place] IF:   You are having difficultly using the spirometer.  You have trouble using the spirometer as often as instructed.  Your pain medication is not giving enough relief while using the spirometer.  You develop fever of 100.5 F (38.1 C) or higher. SEEK IMMEDIATE MEDICAL CARE IF:   You cough up bloody sputum that had not been present before.  You develop fever of 102 F (38.9 C) or greater.  You develop worsening pain at or near the incision site. MAKE SURE YOU:   Understand these instructions.  Will watch your condition.  Will get help right away if you are not doing well or get worse. Document Released: 05/02/2006 Document Revised: 03/14/2011 Document Reviewed: 07/03/2006 [employer] Patient Information 2014 [employer], [place].   ________________________________________________________________________  WHAT IS A BLOOD TRANSFUSION? Blood Transfusion Information  A transfusion is the replacement of blood or some of its parts. Blood is made up of multiple cells which provide different functions.  Red blood cells carry oxygen and are used for blood loss replacement.  White blood cells fight against infection.  Platelets control bleeding.  Plasma helps clot blood.  Other blood products are available for specialized needs, such as hemophilia or other clotting disorders. BEFORE THE TRANSFUSION  Who gives blood for transfusions?   Healthy volunteers who are fully evaluated to make sure their blood is safe. This is blood bank blood. Transfusion therapy is the safest it has ever been in the practice of medicine. Before blood is taken from a donor, a complete history is taken to make sure that person has no history of diseases nor engages in risky social behavior (examples are  intravenous drug use or sexual activity with multiple partners). The donor's travel history is screened to minimize risk of transmitting infections, such as malaria. The donated blood is tested for signs of infectious diseases, such as HIV and hepatitis. The blood is then tested to be sure it is compatible with you in order to minimize the chance of a transfusion reaction. If you or a relative donates blood, this is often done in anticipation of surgery and is not appropriate for emergency situations. It takes many days to process the donated blood. RISKS AND COMPLICATIONS Although transfusion therapy is very safe and saves many lives, the main dangers of transfusion include:   Getting an infectious disease.  Developing a transfusion reaction. This is an allergic reaction to something in the blood you were given. Every precaution is taken to prevent this. The decision to have a blood transfusion has been considered carefully by your caregiver before blood is given. Blood is not given unless the benefits outweigh the risks. AFTER THE TRANSFUSION  Right after receiving a blood transfusion, you will usually feel much better and more energetic. This is especially true if your red blood cells have gotten low (anemic). The transfusion raises the level of the red blood cells which carry oxygen, and this usually causes an energy increase.  The nurse administering the transfusion will monitor you carefully for complications. HOME CARE INSTRUCTIONS  No special instructions are needed after a transfusion. You may find your energy is better. Speak with your caregiver about any limitations on activity for underlying diseases  you may have. SEEK MEDICAL CARE IF:   Your condition is not improving after your transfusion.  You develop redness or irritation at the intravenous (IV) site. SEEK IMMEDIATE MEDICAL CARE IF:  Any of the following symptoms occur over the next 12 hours:  Shaking chills.  You have a  temperature by mouth above 102 F (38.9 C), not controlled by medicine.  Chest, back, or muscle pain.  People around you feel you are not acting correctly or are confused.  Shortness of breath or difficulty breathing.  Dizziness and fainting.  You get a rash or develop hives.  You have a decrease in urine output.  Your urine turns a dark color or changes to pink, red, or brown. Any of the following symptoms occur over the next 10 days:  You have a temperature by mouth above 102 F (38.9 C), not controlled by medicine.  Shortness of breath.  Weakness after normal activity.  The white part of the eye turns yellow (jaundice).  You have a decrease in the amount of urine or are urinating less often.  Your urine turns a dark color or changes to pink, red, or brown. Document Released: 12/18/1999 Document Revised: 03/14/2011 Document Reviewed: 08/06/2007 [facility] Patient Information 2014 [place], [place].  _______________________________________________________________________

## 2017-03-20 ENCOUNTER — Other Ambulatory Visit: Payer: Self-pay

## 2017-03-20 ENCOUNTER — Encounter (HOSPITAL_COMMUNITY): Payer: Self-pay

## 2017-03-20 ENCOUNTER — Encounter (HOSPITAL_COMMUNITY)
Admission: RE | Admit: 2017-03-20 | Discharge: 2017-03-20 | Disposition: A | Discharge status: Home / Self Care | Payer: Managed Care, Other (non HMO) | Source: Ambulatory Visit | Attending: Obstetrics & Gynecology | Admitting: Obstetrics & Gynecology

## 2017-03-20 DIAGNOSIS — Z01812 Encounter for preprocedural laboratory examination: Secondary | ICD-10-CM | POA: Diagnosis present

## 2017-03-20 DIAGNOSIS — Z01810 Encounter for preprocedural cardiovascular examination: Secondary | ICD-10-CM | POA: Diagnosis not present

## 2017-03-20 DIAGNOSIS — I10 Essential (primary) hypertension: Secondary | ICD-10-CM | POA: Insufficient documentation

## 2017-03-20 DIAGNOSIS — E119 Type 2 diabetes mellitus without complications: Secondary | ICD-10-CM | POA: Insufficient documentation

## 2017-03-20 HISTORY — DX: Headache: R51

## 2017-03-20 HISTORY — DX: Headache, unspecified: R51.9

## 2017-03-20 LAB — CBC
HCT: 40.7 % (ref 36.0–46.0)
Hemoglobin: 12.9 g/dL (ref 12.0–15.0)
MCH: 27.2 pg (ref 26.0–34.0)
MCHC: 31.7 g/dL (ref 30.0–36.0)
MCV: 85.7 fL (ref 78.0–100.0)
PLATELETS: 350 10*3/uL (ref 150–400)
RBC: 4.75 MIL/uL (ref 3.87–5.11)
RDW: 14.1 % (ref 11.5–15.5)
WBC: 8 10*3/uL (ref 4.0–10.5)

## 2017-03-20 LAB — BASIC METABOLIC PANEL
Anion gap: 11 (ref 5–15)
BUN: 21 mg/dL — AB (ref 6–20)
CALCIUM: 9.4 mg/dL (ref 8.9–10.3)
CHLORIDE: 100 mmol/L — AB (ref 101–111)
CO2: 27 mmol/L (ref 22–32)
CREATININE: 1.46 mg/dL — AB (ref 0.44–1.00)
GFR calc Af Amer: 48 mL/min — ABNORMAL LOW (ref >60)
GFR calc non Af Amer: 41 mL/min — ABNORMAL LOW (ref >60)
GLUCOSE: 120 mg/dL — AB (ref 65–99)
Potassium: 4.3 mmol/L (ref 3.5–5.1)
Sodium: 138 mmol/L (ref 135–145)

## 2017-03-20 LAB — GLUCOSE, CAPILLARY: Glucose-Capillary: 205 mg/dL — ABNORMAL HIGH (ref 65–99)

## 2017-03-20 LAB — HCG, SERUM, QUALITATIVE: Preg, Serum: NEGATIVE

## 2017-03-20 LAB — ABO/RH: ABO/RH(D): B POS

## 2017-03-20 NOTE — Progress Notes (Signed)
   03/20/17 1331  OBSTRUCTIVE SLEEP APNEA  Have you ever been diagnosed with sleep apnea through a sleep study? No  Do you snore loudly (loud enough to be heard through closed doors)?  1  Do you often feel tired, fatigued, or sleepy during the daytime (such as falling asleep during driving or talking to someone)? 1  Has anyone observed you stop breathing during your sleep? 1  Do you have, or are you being treated for high blood pressure? 1  BMI more than 35 kg/m2? 0  Age > 50 (1-yes) 0  Neck circumference greater than:Female 16 inches or larger, Female 17inches or larger? 1  Female Gender (Yes=1) 0  Obstructive Sleep Apnea Score 5  Score 5 or greater  Results sent to PCP

## 2017-03-20 NOTE — Progress Notes (Signed)
12/16/2016- noted in [place] and CXR  02/22/2017- noted in [place]

## 2017-03-21 ENCOUNTER — Encounter: Payer: Self-pay | Admitting: Family Medicine

## 2017-03-28 LAB — TYPE AND SCREEN
ABO/RH(D): B POS
Antibody Screen: NEGATIVE

## 2017-04-12 ENCOUNTER — Encounter: Payer: Managed Care, Other (non HMO) | Admitting: Obstetrics & Gynecology

## 2017-04-28 NOTE — Patient Instructions (Addendum)
Renee James  04/28/2017   Your procedure is scheduled on: 05-09-17   Report to [facility] Main  Entrance Report to Admitting at 5:30 AM    Call this number if you have problems the morning of surgery [phone]   Remember: Do not eat food or drink liquids :After Midnight.     Take these medicines the morning of surgery with A SIP OF WATER: None  DO NOT TAKE ANY DIABETIC MEDICATIONS DAY OF YOUR SURGERY                               You may not have any metal on your body including hair pins and              piercings  Do not wear jewelry, make-up, lotions, powders or perfumes, deodorant             Do not wear nail polish.  Do not shave  48 hours prior to surgery.             Do not bring valuables to the hospital. [place].  Contacts, dentures or bridgework may not be worn into surgery.  Leave suitcase in the car. After surgery it may be brought to your room.                 Please read over the following fact sheets you were given: _____________________________________________________________________ How to Manage Your Diabetes Before and After Surgery  Why is it important to control my blood sugar before and after surgery? . Improving blood sugar levels before and after surgery helps healing and can limit problems. . A way of improving blood sugar control is eating a healthy diet by: o  Eating less sugar and carbohydrates o  Increasing activity/exercise o  Talking with your doctor about reaching your blood sugar goals . High blood sugars (greater than 180 mg/dL) can raise your risk of infections and slow your recovery, so you will need to focus on controlling your diabetes during the weeks before surgery. . Make sure that the doctor who takes care of your diabetes knows about your planned surgery including the date and location.  How do I manage my blood sugar before surgery? . Check your blood sugar  at least 4 times a day, starting 2 days before surgery, to make sure that the level is not too high or low. o Check your blood sugar the morning of your surgery when you wake up and every 2 hours until you get to the Short Stay unit. . If your blood sugar is less than 70 mg/dL, you will need to treat for low blood sugar: o Do not take insulin. o Treat a low blood sugar (less than 70 mg/dL) with  cup of clear juice (cranberry or apple), 4 glucose tablets, OR glucose gel. o Recheck blood sugar in 15 minutes after treatment (to make sure it is greater than 70 mg/dL). If your blood sugar is not greater than 70 mg/dL on recheck, call [phone] for further instructions. . Report your blood sugar to the short stay nurse when you get to Short Stay.  . If you are admitted to the hospital after surgery: o Your blood sugar will be checked by the  staff and you will probably be given insulin after surgery (instead of oral diabetes medicines) to make sure you have good blood sugar levels. o The goal for blood sugar control after surgery is 80-180 mg/dL.   WHAT DO I DO ABOUT MY DIABETES MEDICATION?  Marland Kitchen Do not take oral diabetes medicines (pills) the morning of surgery.  . THE DAY BEFORE SURGERY, take only your morning dose of Actos and usual dose of Metformin. You can also take your usual 24-25 U of Levemir Insulin in the morning. However, only take 12 units of Levemir Insulin at dinner/bedtime.       Patient Signature:  Date:   Nurse Signature:  Date:   Reviewed and Endorsed by [facility] Patient Education Committee, August 2015            [facility] - Preparing for Surgery Before surgery, you can play an important role.  Because skin is not sterile, your skin needs to be as free of germs as possible.  You can reduce the number of germs on your skin by washing with CHG (chlorahexidine gluconate) soap before surgery.  CHG is an antiseptic cleaner which kills germs and bonds with the skin to  continue killing germs even after washing. Please DO NOT use if you have an allergy to CHG or antibacterial soaps.  If your skin becomes reddened/irritated stop using the CHG and inform your nurse when you arrive at Short Stay. Do not shave (including legs and underarms) for at least 48 hours prior to the first CHG shower.  You may shave your face/neck. Please follow these instructions carefully:  1.  Shower with CHG Soap the night before surgery and the  morning of Surgery.  2.  If you choose to wash your hair, wash your hair first as usual with your  normal  shampoo.  3.  After you shampoo, rinse your hair and body thoroughly to remove the  shampoo.                           4.  Use CHG as you would any other liquid soap.  You can apply chg directly  to the skin and wash                       Gently with a scrungie or clean washcloth.  5.  Apply the CHG Soap to your body ONLY FROM THE NECK DOWN.   Do not use on face/ open                           Wound or open sores. Avoid contact with eyes, ears mouth and genitals (private parts).                       Wash face,  Genitals (private parts) with your normal soap.             6.  Wash thoroughly, paying special attention to the area where your surgery  will be performed.  7.  Thoroughly rinse your body with warm water from the neck down.  8.  DO NOT shower/wash with your normal soap after using and rinsing off  the CHG Soap.                9.  Pat yourself dry with a clean towel.  10.  Wear clean pajamas.            11.  Place clean sheets on your bed the night of your first shower and do not  sleep with pets. Day of Surgery : Do not apply any lotions/deodorants the morning of surgery.  Please wear clean clothes to the hospital/surgery center.  FAILURE TO FOLLOW THESE INSTRUCTIONS MAY RESULT IN THE CANCELLATION OF YOUR SURGERY PATIENT SIGNATURE_________________________________  NURSE  SIGNATURE__________________________________  ________________________________________________________________________  WHAT IS A BLOOD TRANSFUSION? Blood Transfusion Information  A transfusion is the replacement of blood or some of its parts. Blood is made up of multiple cells which provide different functions.  Red blood cells carry oxygen and are used for blood loss replacement.  White blood cells fight against infection.  Platelets control bleeding.  Plasma helps clot blood.  Other blood products are available for specialized needs, such as hemophilia or other clotting disorders. BEFORE THE TRANSFUSION  Who gives blood for transfusions?   Healthy volunteers who are fully evaluated to make sure their blood is safe. This is blood bank blood. Transfusion therapy is the safest it has ever been in the practice of medicine. Before blood is taken from a donor, a complete history is taken to make sure that person has no history of diseases nor engages in risky social behavior (examples are intravenous drug use or sexual activity with multiple partners). The donor's travel history is screened to minimize risk of transmitting infections, such as malaria. The donated blood is tested for signs of infectious diseases, such as HIV and hepatitis. The blood is then tested to be sure it is compatible with you in order to minimize the chance of a transfusion reaction. If you or a relative donates blood, this is often done in anticipation of surgery and is not appropriate for emergency situations. It takes many days to process the donated blood. RISKS AND COMPLICATIONS Although transfusion therapy is very safe and saves many lives, the main dangers of transfusion include:   Getting an infectious disease.  Developing a transfusion reaction. This is an allergic reaction to something in the blood you were given. Every precaution is taken to prevent this. The decision to have a blood transfusion has been  considered carefully by your caregiver before blood is given. Blood is not given unless the benefits outweigh the risks. AFTER THE TRANSFUSION  Right after receiving a blood transfusion, you will usually feel much better and more energetic. This is especially true if your red blood cells have gotten low (anemic). The transfusion raises the level of the red blood cells which carry oxygen, and this usually causes an energy increase.  The nurse administering the transfusion will monitor you carefully for complications. HOME CARE INSTRUCTIONS  No special instructions are needed after a transfusion. You may find your energy is better. Speak with your caregiver about any limitations on activity for underlying diseases you may have. SEEK MEDICAL CARE IF:   Your condition is not improving after your transfusion.  You develop redness or irritation at the intravenous (IV) site. SEEK IMMEDIATE MEDICAL CARE IF:  Any of the following symptoms occur over the next 12 hours:  Shaking chills.  You have a temperature by mouth above 102 F (38.9 C), not controlled by medicine.  Chest, back, or muscle pain.  People around you feel you are not acting correctly or are confused.  Shortness of breath or difficulty breathing.  Dizziness and fainting.  You get a rash or develop  hives.  You have a decrease in urine output.  Your urine turns a dark color or changes to pink, red, or brown. Any of the following symptoms occur over the next 10 days:  You have a temperature by mouth above 102 F (38.9 C), not controlled by medicine.  Shortness of breath.  Weakness after normal activity.  The white part of the eye turns yellow (jaundice).  You have a decrease in the amount of urine or are urinating less often.  Your urine turns a dark color or changes to pink, red, or brown. Document Released: 12/18/1999 Document Revised: 03/14/2011 Document Reviewed: 08/06/2007 [facility] Patient Information 2014  [place], [place].  _______________________________________________________________________

## 2017-04-28 NOTE — Progress Notes (Signed)
03-20-17 (Epic) EKG  12-16-16 (Epic) CXR

## 2017-05-01 ENCOUNTER — Other Ambulatory Visit: Payer: Self-pay

## 2017-05-01 ENCOUNTER — Encounter (HOSPITAL_COMMUNITY)
Admission: RE | Admit: 2017-05-01 | Discharge: 2017-05-01 | Disposition: A | Discharge status: Home / Self Care | Payer: Managed Care, Other (non HMO) | Source: Ambulatory Visit | Attending: Obstetrics & Gynecology | Admitting: Obstetrics & Gynecology

## 2017-05-01 ENCOUNTER — Encounter (HOSPITAL_COMMUNITY): Payer: Self-pay

## 2017-05-01 DIAGNOSIS — Z01812 Encounter for preprocedural laboratory examination: Secondary | ICD-10-CM | POA: Diagnosis present

## 2017-05-01 LAB — CBC
HCT: 39.1 % (ref 36.0–46.0)
Hemoglobin: 12.4 g/dL (ref 12.0–15.0)
MCH: 27.3 pg (ref 26.0–34.0)
MCHC: 31.7 g/dL (ref 30.0–36.0)
MCV: 86.1 fL (ref 78.0–100.0)
PLATELETS: 340 10*3/uL (ref 150–400)
RBC: 4.54 MIL/uL (ref 3.87–5.11)
RDW: 14.7 % (ref 11.5–15.5)
WBC: 9.8 10*3/uL (ref 4.0–10.5)

## 2017-05-01 LAB — GLUCOSE, CAPILLARY: Glucose-Capillary: 156 mg/dL — ABNORMAL HIGH (ref 65–99)

## 2017-05-01 LAB — BASIC METABOLIC PANEL
ANION GAP: 11 (ref 5–15)
BUN: 22 mg/dL — ABNORMAL HIGH (ref 6–20)
CALCIUM: 9.4 mg/dL (ref 8.9–10.3)
CO2: 26 mmol/L (ref 22–32)
CREATININE: 1.43 mg/dL — AB (ref 0.44–1.00)
Chloride: 105 mmol/L (ref 101–111)
GFR calc non Af Amer: 43 mL/min — ABNORMAL LOW (ref >60)
GFR, EST AFRICAN AMERICAN: 49 mL/min — AB (ref >60)
Glucose, Bld: 120 mg/dL — ABNORMAL HIGH (ref 65–99)
Potassium: 4.5 mmol/L (ref 3.5–5.1)
SODIUM: 142 mmol/L (ref 135–145)

## 2017-05-01 LAB — HEMOGLOBIN A1C
HEMOGLOBIN A1C: 6.9 % — AB (ref 4.8–5.6)
Mean Plasma Glucose: 151.33 mg/dL

## 2017-05-01 LAB — PREGNANCY, URINE: PREG TEST UR: NEGATIVE

## 2017-05-08 NOTE — Anesthesia Preprocedure Evaluation (Addendum)
Anesthesia Evaluation  Patient identified by MRN, date of birth, ID band Patient awake    Reviewed: Allergy & Precautions, H&P , NPO status , Patient's Chart, lab work & pertinent test results, reviewed documented beta blocker date and time   History of Anesthesia Complications (+) PSEUDOCHOLINESTERASE DEFICIENCY  Airway Mallampati: II  TM Distance: >3 FB Neck ROM: Full    Dental no notable dental hx. (+) Teeth Intact, Chipped, Dental Advisory Given   Pulmonary asthma , pneumonia, Current Smoker,    Pulmonary exam normal breath sounds clear to auscultation       Cardiovascular hypertension, Pt. on medications  Rhythm:Regular Rate:Normal     Neuro/Psych  Headaches, Anxiety Depression Bell's palsy  Neuromuscular disease    GI/Hepatic negative GI ROS, Neg liver ROS,   Endo/Other  diabetes, Well Controlled, Type 2, Oral Hypoglycemic Agents, Insulin DependentObesity  Renal/GU CRFRenal disease     Musculoskeletal  (+) Arthritis ,   Abdominal (+) + obese,   Peds  Hematology negative hematology ROS (+)   Anesthesia Other Findings   Reproductive/Obstetrics                           Anesthesia Physical  Anesthesia Plan  ASA: III  Anesthesia Plan: General   Post-op Pain Management:    Induction: Intravenous  PONV Risk Score and Plan: 4 or greater and Treatment may vary due to age or medical condition, Ondansetron, Dexamethasone, Midazolam and Scopolamine patch - Pre-op  Airway Management Planned: Oral ETT  Additional Equipment: None  Intra-op Plan:   Post-operative Plan: Extubation in OR  Informed Consent: I have reviewed the patients History and Physical, chart, labs and discussed the procedure including the risks, benefits and alternatives for the proposed anesthesia with the patient or authorized representative who has indicated his/her understanding and acceptance.   Dental  advisory given  Plan Discussed with: CRNA and Anesthesiologist  Anesthesia Plan Comments:         Anesthesia Quick Evaluation

## 2017-05-08 NOTE — H&P (Signed)
Preoperative History and Physical  Renee James is a 47 y.o. [id number] here for surgical management of AUB. Pt has had 3 prior abd/pelvic surgeries   Proposed surgery:  robot assisted total laparoscopic hysterectomy with bilateral salpingectomy  Past Medical History:  Diagnosis Date  . Allergy   . Anxiety   . Arthritis   . Asthma   . Bell's palsy   . Bell's palsy 2012  . Chronic kidney disease (CKD), stage III (moderate) (HCC)   . Depression   . Diabetes mellitus without complication ([place])   . Diabetic neuropathy ([place])   . Headache    migraines  . Hyperlipidemia   . Hypertension   . Pneumonia   . Renal insufficiency 05/13/2015   Past Surgical History:  Procedure Laterality Date  . ECTOPIC PREGNANCY SURGERY    . LAPAROSCOPY N/A 04/28/2015   Procedure: LAPAROSCOPY DIAGNOSTIC;  Surgeon: Donnamae Jude, MD;  Location: [facility];  Service: Gynecology;  Laterality: N/A;  . OVARIAN CYST REMOVAL    . TUBAL LIGATION     OB History    Gravida  5   Para      Term      Preterm      AB  2   Living  3     SAB      TAB  1   Ectopic  1   Multiple      Live Births             Patient denies any cervical dysplasia or STIs. No medications prior to admission.    Allergies  Allergen Reactions  . Latex Hives, Shortness Of Breath and Itching  . Talc Hives, Shortness Of Breath and Itching  . Ace Inhibitors Cough  . 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  She has a 11.50 pack-year smoking history. She has never used smokeless tobacco. She reports that she drinks alcohol. She reports that she does not use drugs. Family History  Problem Relation Age of Onset  . Diabetes Mother   . Heart disease Mother   . Kidney failure Mother   . Stroke Father   . Kidney failure Father   . Breast cancer Unknown     Review of Systems:  Noncontributory  PHYSICAL EXAM: BP 113/75   Pulse 80   Temp 98.5 F (36.9 C) (Oral)   Resp 16   Ht 5\' 3"  (1.6 m)   Wt 197 lb (89.4 kg)   LMP 05/09/2017   SpO2 100%   BMI 34.90 kg/m   General appearance - alert, well appearing, and in no  distress Chest - clear to auscultation, no wheezes, rales or rhonchi, symmetric air entry Heart - normal rate and regular rhythm Abdomen - soft, nontender, nondistended, no masses or organomegaly Pelvic - examination not indicated Extremities - peripheral pulses normal, no pedal edema, no clubbing or cyanosis  Labs: Results for orders placed or performed during the hospital encounter of 05/01/17 (from the past 336 hour(s))  Glucose, capillary   Collection Time: 05/01/17  9:53 AM  Result Value Ref Range   Glucose-Capillary 156 (H) 65 - 99 mg/dL  Hemoglobin A1c   Collection Time: 05/01/17 10:26 AM  Result Value Ref Range   Hgb A1c MFr Bld 6.9 (H) 4.8 - 5.6 %   Mean Plasma Glucose 151.33 mg/dL  Basic metabolic  panel   Collection Time: 05/01/17 10:26 AM  Result Value Ref Range   Sodium 142 135 - 145 mmol/L   Potassium 4.5 3.5 - 5.1 mmol/L   Chloride 105 101 - 111 mmol/L   CO2 26 22 - 32 mmol/L   Glucose, Bld 120 (H) 65 - 99 mg/dL   BUN 22 (H) 6 - 20 mg/dL   Creatinine, Ser 1.43 (H) 0.44 - 1.00 mg/dL   Calcium 9.4 8.9 - 10.3 mg/dL   GFR calc non Af Amer 43 (L) >60 mL/min   GFR calc Af Amer 49 (L) >60 mL/min   Anion gap 11 5 - 15  CBC   Collection Time: 05/01/17 10:26 AM  Result Value Ref Range   WBC 9.8 4.0 - 10.5 K/uL   RBC 4.54 3.87 - 5.11 MIL/uL   Hemoglobin 12.4 12.0 - 15.0 g/dL   HCT 39.1 36.0 - 46.0 %   MCV 86.1 78.0 - 100.0 fL   MCH 27.3 26.0 - 34.0 pg   MCHC 31.7 30.0 - 36.0 g/dL   RDW 14.7 11.5 - 15.5 %   Platelets 340 150 - 400 K/uL  Pregnancy, urine   Collection Time: 05/01/17 10:26 AM  Result Value Ref Range   Preg Test, Ur NEGATIVE NEGATIVE  Type and screen [place]   Collection Time:  05/01/17 10:29 AM  Result Value Ref Range   ABO/RH(D) B POS    Antibody Screen NEG    Sample Expiration 05/15/2017    Extend sample reason      NO TRANSFUSIONS OR PREGNANCY IN THE PAST 3 MONTHS Performed at [facility], [place] [address], [place], [place] [address]     Imaging Studies: 12/16/2017 CLINICAL DATA:  Assess large right adnexal cystic lesion seen on CT. Lower abdominal pain, acute onset.  EXAM: TRANSABDOMINAL AND TRANSVAGINAL ULTRASOUND OF PELVIS  DOPPLER ULTRASOUND OF OVARIES  TECHNIQUE: Both transabdominal and transvaginal ultrasound examinations of the pelvis were performed. Transabdominal technique was performed for global imaging of the pelvis including uterus, ovaries, adnexal regions, and pelvic cul-de-sac.  It was necessary to proceed with endovaginal exam following the transabdominal exam to visualize the uterus and ovaries in greater detail. Color and duplex Doppler ultrasound was utilized to evaluate blood flow to the ovaries.  COMPARISON:  None.  FINDINGS: Uterus  Measurements: 10.3 x 5.5 x 5.9 cm. A 1.8 cm intramural fibroid is noted at the left anterior uterine wall. Multiple nabothian cysts are noted.  Endometrium  Thickness: 1.0 cm.  No focal abnormality visualized.  Right ovary  Not well characterized. There is a 4.4 x 3.7 x 4.2 cm anechoic cyst at the right adnexa, with likely adjacent ovarian tissue. Blood flow at the soft tissue rim is not well characterized due to its location and overlying bowel gas.  Left ovary  Measurements: 3.3 x 2.3 x 2.3 cm. Normal appearance/no adnexal mass.  Pulsed Doppler evaluation of the left ovary demonstrates normal low-resistance arterial and venous waveforms.  Other findings  No abnormal free fluid.  IMPRESSION: 1. No definite evidence for ovarian torsion. However, the right ovary is not well characterized due to a 4.4 cm right adnexal cyst and overlying  structures. 2. 4.4 cm right adnexal simple appearing cyst. 3. Small intramural uterine fibroid noted. Uterus otherwise unremarkable in appearance  Assessment: Patient Active Problem List   Diagnosis Date Noted  . Right ovarian cyst 03/07/2017  . Fibroids 03/07/2017  . Abnormal uterine bleeding (AUB) 02/22/2017  .  CKD (chronic kidney disease) stage 3, GFR 30-59 ml/min (HCC) 05/16/2016  . Renal insufficiency 05/13/2015  . Ovarian torsion 04/28/2015  . Pain   . Hyperlipidemia 03/19/2015  . Obesity, unspecified 03/19/2015  . Controlled type 2 diabetes mellitus without complication, without long-term current use of insulin ([place]) 03/19/2015  . Essential hypertension 03/19/2015  . GAD (generalized anxiety disorder) 03/19/2015    Plan: Patient will undergo surgical management with robot assisted total laparoscopic hysterectomy with bilateral salpingectomy.   The risks of surgery were discussed in detail with the patient including but not limited to: bleeding which may require transfusion or reoperation; infection which may require antibiotics; injury to surrounding organs which may involve bowel, bladder, ureters ; need for additional procedures including laparoscopy or laparotomy; thromboembolic phenomenon, surgical site problems and other postoperative/anesthesia complications. Likelihood of success in alleviating the patient's condition was discussed. Routine postoperative instructions will be reviewed with the patient and her family in detail after surgery.  The patient concurred with the proposed plan, giving informed written consent for the surgery.  Patient has been NPO since last night she will remain NPO for procedure.  Anesthesia and OR aware.  Preoperative prophylactic antibiotics and SCDs ordered on call to the OR.  To OR when ready.  Caeleigh Prohaska L. Ihor Dow, M.D., [facility] 05/08/2017 5:02 PM

## 2017-05-09 ENCOUNTER — Ambulatory Visit (HOSPITAL_COMMUNITY): Payer: Managed Care, Other (non HMO) | Admitting: Anesthesiology

## 2017-05-09 ENCOUNTER — Other Ambulatory Visit: Payer: Self-pay

## 2017-05-09 ENCOUNTER — Encounter (HOSPITAL_COMMUNITY): Payer: Self-pay | Admitting: *Deleted

## 2017-05-09 ENCOUNTER — Encounter (HOSPITAL_COMMUNITY)
Admission: RE | Disposition: A | Discharge status: Home / Self Care | Payer: Self-pay | Source: Ambulatory Visit | Attending: Obstetrics & Gynecology

## 2017-05-09 ENCOUNTER — Observation Stay (HOSPITAL_COMMUNITY)
Admission: RE | Admit: 2017-05-09 | Discharge: 2017-05-10 | Disposition: A | Discharge status: Home / Self Care | Payer: Managed Care, Other (non HMO) | Source: Ambulatory Visit | Attending: Obstetrics & Gynecology | Admitting: Obstetrics & Gynecology

## 2017-05-09 DIAGNOSIS — Z6834 Body mass index (BMI) 34.0-34.9, adult: Secondary | ICD-10-CM | POA: Insufficient documentation

## 2017-05-09 DIAGNOSIS — N183 Chronic kidney disease, stage 3 unspecified: Secondary | ICD-10-CM | POA: Diagnosis present

## 2017-05-09 DIAGNOSIS — N736 Female pelvic peritoneal adhesions (postinfective): Secondary | ICD-10-CM | POA: Diagnosis not present

## 2017-05-09 DIAGNOSIS — E785 Hyperlipidemia, unspecified: Secondary | ICD-10-CM | POA: Diagnosis present

## 2017-05-09 DIAGNOSIS — N83201 Unspecified ovarian cyst, right side: Secondary | ICD-10-CM | POA: Insufficient documentation

## 2017-05-09 DIAGNOSIS — I129 Hypertensive chronic kidney disease with stage 1 through stage 4 chronic kidney disease, or unspecified chronic kidney disease: Secondary | ICD-10-CM | POA: Insufficient documentation

## 2017-05-09 DIAGNOSIS — F411 Generalized anxiety disorder: Secondary | ICD-10-CM | POA: Diagnosis not present

## 2017-05-09 DIAGNOSIS — N838 Other noninflammatory disorders of ovary, fallopian tube and broad ligament: Secondary | ICD-10-CM | POA: Diagnosis not present

## 2017-05-09 DIAGNOSIS — Z888 Allergy status to other drugs, medicaments and biological substances status: Secondary | ICD-10-CM | POA: Diagnosis not present

## 2017-05-09 DIAGNOSIS — N939 Abnormal uterine and vaginal bleeding, unspecified: Secondary | ICD-10-CM | POA: Diagnosis not present

## 2017-05-09 DIAGNOSIS — N840 Polyp of corpus uteri: Secondary | ICD-10-CM | POA: Diagnosis not present

## 2017-05-09 DIAGNOSIS — I10 Essential (primary) hypertension: Secondary | ICD-10-CM | POA: Diagnosis present

## 2017-05-09 DIAGNOSIS — Z9889 Other specified postprocedural states: Secondary | ICD-10-CM

## 2017-05-09 DIAGNOSIS — Z841 Family history of disorders of kidney and ureter: Secondary | ICD-10-CM | POA: Diagnosis not present

## 2017-05-09 DIAGNOSIS — D261 Other benign neoplasm of corpus uteri: Secondary | ICD-10-CM | POA: Diagnosis not present

## 2017-05-09 DIAGNOSIS — E1169 Type 2 diabetes mellitus with other specified complication: Secondary | ICD-10-CM | POA: Insufficient documentation

## 2017-05-09 DIAGNOSIS — E1122 Type 2 diabetes mellitus with diabetic chronic kidney disease: Secondary | ICD-10-CM | POA: Diagnosis not present

## 2017-05-09 DIAGNOSIS — N289 Disorder of kidney and ureter, unspecified: Secondary | ICD-10-CM

## 2017-05-09 DIAGNOSIS — Z91018 Allergy to other foods: Secondary | ICD-10-CM | POA: Diagnosis not present

## 2017-05-09 DIAGNOSIS — Z794 Long term (current) use of insulin: Secondary | ICD-10-CM | POA: Diagnosis not present

## 2017-05-09 DIAGNOSIS — E1165 Type 2 diabetes mellitus with hyperglycemia: Secondary | ICD-10-CM

## 2017-05-09 DIAGNOSIS — F17210 Nicotine dependence, cigarettes, uncomplicated: Secondary | ICD-10-CM | POA: Insufficient documentation

## 2017-05-09 DIAGNOSIS — N938 Other specified abnormal uterine and vaginal bleeding: Secondary | ICD-10-CM | POA: Diagnosis not present

## 2017-05-09 DIAGNOSIS — Z8249 Family history of ischemic heart disease and other diseases of the circulatory system: Secondary | ICD-10-CM | POA: Diagnosis not present

## 2017-05-09 DIAGNOSIS — Z79899 Other long term (current) drug therapy: Secondary | ICD-10-CM | POA: Diagnosis not present

## 2017-05-09 DIAGNOSIS — E669 Obesity, unspecified: Secondary | ICD-10-CM | POA: Insufficient documentation

## 2017-05-09 DIAGNOSIS — J45909 Unspecified asthma, uncomplicated: Secondary | ICD-10-CM | POA: Insufficient documentation

## 2017-05-09 DIAGNOSIS — R102 Pelvic and perineal pain: Secondary | ICD-10-CM | POA: Insufficient documentation

## 2017-05-09 DIAGNOSIS — Z91040 Latex allergy status: Secondary | ICD-10-CM | POA: Insufficient documentation

## 2017-05-09 DIAGNOSIS — D219 Benign neoplasm of connective and other soft tissue, unspecified: Secondary | ICD-10-CM | POA: Diagnosis present

## 2017-05-09 DIAGNOSIS — IMO0001 Reserved for inherently not codable concepts without codable children: Secondary | ICD-10-CM

## 2017-05-09 HISTORY — PX: ABDOMINAL HYSTERECTOMY: SHX81

## 2017-05-09 HISTORY — PX: ROBOTIC ASSISTED LAPAROSCOPIC HYSTERECTOMY AND SALPINGECTOMY: SHX6379

## 2017-05-09 LAB — GLUCOSE, CAPILLARY
GLUCOSE-CAPILLARY: 84 mg/dL (ref 65–99)
GLUCOSE-CAPILLARY: 213 mg/dL — AB (ref 65–99)
Glucose-Capillary: 101 mg/dL — ABNORMAL HIGH (ref 65–99)
Glucose-Capillary: 247 mg/dL — ABNORMAL HIGH (ref 65–99)

## 2017-05-09 LAB — TYPE AND SCREEN
ABO/RH(D): B POS
ANTIBODY SCREEN: NEGATIVE

## 2017-05-09 SURGERY — XI ROBOTIC ASSISTED LAPAROSCOPIC HYSTERECTOMY AND SALPINGECTOMY
Anesthesia: General | Site: Abdomen | Laterality: Bilateral

## 2017-05-09 MED ORDER — SODIUM CHLORIDE 0.9 % IR SOLN
Status: DC | PRN
  Administered 2017-05-09: 3000 mL

## 2017-05-09 MED ORDER — SOD CITRATE-CITRIC ACID 500-334 MG/5ML PO SOLN
ORAL | Status: AC
  Filled 2017-05-09: qty 15

## 2017-05-09 MED ORDER — PRAVASTATIN SODIUM 20 MG PO TABS: 20.0000 mg | ORAL_TABLET | Freq: Every day | ORAL | Status: DC

## 2017-05-09 MED ORDER — SODIUM CHLORIDE 0.9 % IJ SOLN
INTRAMUSCULAR | Status: AC
  Filled 2017-05-09: qty 10

## 2017-05-09 MED ORDER — INDIGOTINDISULFONATE SODIUM 8 MG/ML IJ SOLN
INTRAMUSCULAR | Status: AC
  Filled 2017-05-09: qty 5

## 2017-05-09 MED ORDER — LACTATED RINGERS IV SOLN: INTRAVENOUS | Status: DC

## 2017-05-09 MED ORDER — LIDOCAINE HCL (CARDIAC) PF 100 MG/5ML IV SOSY
PREFILLED_SYRINGE | INTRAVENOUS | Status: DC | PRN
  Administered 2017-05-09: 100 mg via INTRAVENOUS

## 2017-05-09 MED ORDER — INSULIN DETEMIR 100 UNIT/ML FLEXPEN
10.0000 [IU] | PEN_INJECTOR | Freq: Every day | SUBCUTANEOUS | Status: DC
  Administered 2017-05-09: 10 [IU] via SUBCUTANEOUS

## 2017-05-09 MED ORDER — LOSARTAN POTASSIUM 50 MG PO TABS
100.0000 mg | ORAL_TABLET | Freq: Every day | ORAL | Status: DC
  Administered 2017-05-09: 100 mg via ORAL
  Filled 2017-05-09: qty 2

## 2017-05-09 MED ORDER — FENTANYL CITRATE (PF) 250 MCG/5ML IJ SOLN
INTRAMUSCULAR | Status: AC
  Filled 2017-05-09: qty 5

## 2017-05-09 MED ORDER — FENTANYL CITRATE (PF) 100 MCG/2ML IJ SOLN
INTRAMUSCULAR | Status: DC | PRN
  Administered 2017-05-09 (×3): 25 ug via INTRAVENOUS
  Administered 2017-05-09: 50 ug via INTRAVENOUS
  Administered 2017-05-09 (×3): 25 ug via INTRAVENOUS
  Administered 2017-05-09: 50 ug via INTRAVENOUS
  Administered 2017-05-09 (×2): 25 ug via INTRAVENOUS
  Administered 2017-05-09: 50 ug via INTRAVENOUS

## 2017-05-09 MED ORDER — ALBUTEROL SULFATE HFA 108 (90 BASE) MCG/ACT IN AERS: 1.0000 | INHALATION_SPRAY | Freq: Four times a day (QID) | RESPIRATORY_TRACT | Status: DC | PRN

## 2017-05-09 MED ORDER — ONDANSETRON HCL 4 MG/2ML IJ SOLN
INTRAMUSCULAR | Status: AC
  Filled 2017-05-09: qty 2

## 2017-05-09 MED ORDER — PROMETHAZINE HCL 25 MG/ML IJ SOLN: 6.2500 mg | INTRAMUSCULAR | Status: DC | PRN

## 2017-05-09 MED ORDER — KETOROLAC TROMETHAMINE 30 MG/ML IJ SOLN: 30.0000 mg | Freq: Three times a day (TID) | INTRAMUSCULAR | Status: DC

## 2017-05-09 MED ORDER — TORSEMIDE 20 MG PO TABS
20.0000 mg | ORAL_TABLET | Freq: Every day | ORAL | Status: DC
  Administered 2017-05-09: 20 mg via ORAL
  Filled 2017-05-09: qty 1

## 2017-05-09 MED ORDER — DEXTROSE-NACL 5-0.45 % IV SOLN: INTRAVENOUS | Status: DC

## 2017-05-09 MED ORDER — SOD CITRATE-CITRIC ACID 500-334 MG/5ML PO SOLN
30.0000 mL | ORAL | Status: AC
  Administered 2017-05-09: 30 mL via ORAL
  Filled 2017-05-09: qty 15

## 2017-05-09 MED ORDER — ZOLPIDEM TARTRATE 5 MG PO TABS: 5.0000 mg | ORAL_TABLET | Freq: Every evening | ORAL | Status: DC | PRN

## 2017-05-09 MED ORDER — ENOXAPARIN SODIUM 30 MG/0.3ML ~~LOC~~ SOLN
30.0000 mg | SUBCUTANEOUS | Status: DC
  Administered 2017-05-10: 30 mg via SUBCUTANEOUS

## 2017-05-09 MED ORDER — CEFAZOLIN SODIUM-DEXTROSE 2-4 GM/100ML-% IV SOLN: 2.0000 g | INTRAVENOUS | Status: DC

## 2017-05-09 MED ORDER — FENTANYL CITRATE (PF) 100 MCG/2ML IJ SOLN
INTRAMUSCULAR | Status: AC
  Filled 2017-05-09: qty 2

## 2017-05-09 MED ORDER — BISACODYL 10 MG RE SUPP
10.0000 mg | Freq: Every day | RECTAL | Status: DC | PRN
  Filled 2017-05-09: qty 1

## 2017-05-09 MED ORDER — FENTANYL CITRATE (PF) 100 MCG/2ML IJ SOLN: 25.0000 ug | INTRAMUSCULAR | Status: DC | PRN

## 2017-05-09 MED ORDER — MENTHOL 3 MG MT LOZG
1.0000 | LOZENGE | OROMUCOSAL | Status: DC | PRN
  Filled 2017-05-09: qty 9

## 2017-05-09 MED ORDER — MIDAZOLAM HCL 5 MG/5ML IJ SOLN
INTRAMUSCULAR | Status: DC | PRN
  Administered 2017-05-09: 2 mg via INTRAVENOUS

## 2017-05-09 MED ORDER — PIOGLITAZONE HCL 30 MG PO TABS: 30.0000 mg | ORAL_TABLET | Freq: Every day | ORAL | Status: DC

## 2017-05-09 MED ORDER — OXYCODONE HCL 5 MG/5ML PO SOLN
5.0000 mg | Freq: Once | ORAL | Status: DC | PRN
  Filled 2017-05-09: qty 5

## 2017-05-09 MED ORDER — OXYCODONE-ACETAMINOPHEN 5-325 MG PO TABS
1.0000 | ORAL_TABLET | ORAL | Status: DC | PRN
  Administered 2017-05-09 – 2017-05-10 (×2): 1 via ORAL

## 2017-05-09 MED ORDER — ONDANSETRON HCL 4 MG/2ML IJ SOLN
INTRAMUSCULAR | Status: DC | PRN
  Administered 2017-05-09: 4 mg via INTRAVENOUS

## 2017-05-09 MED ORDER — ONDANSETRON HCL 4 MG/2ML IJ SOLN: 4.0000 mg | Freq: Four times a day (QID) | INTRAMUSCULAR | Status: DC | PRN

## 2017-05-09 MED ORDER — LIDOCAINE 2% (20 MG/ML) 5 ML SYRINGE
INTRAMUSCULAR | Status: AC
  Filled 2017-05-09: qty 5

## 2017-05-09 MED ORDER — BUPIVACAINE HCL (PF) 0.5 % IJ SOLN
INTRAMUSCULAR | Status: DC | PRN
  Administered 2017-05-09: 30 mL

## 2017-05-09 MED ORDER — DOCUSATE SODIUM 100 MG PO CAPS
ORAL_CAPSULE | ORAL | Status: AC
  Filled 2017-05-09: qty 1

## 2017-05-09 MED ORDER — ROCURONIUM BROMIDE 100 MG/10ML IV SOLN
INTRAVENOUS | Status: DC | PRN
  Administered 2017-05-09: 20 mg via INTRAVENOUS
  Administered 2017-05-09 (×2): 10 mg via INTRAVENOUS
  Administered 2017-05-09: 60 mg via INTRAVENOUS

## 2017-05-09 MED ORDER — HYDROMORPHONE HCL 1 MG/ML IJ SOLN
0.2000 mg | INTRAMUSCULAR | Status: DC | PRN
  Administered 2017-05-09 (×2): 0.5 mg via INTRAVENOUS

## 2017-05-09 MED ORDER — HYDROMORPHONE HCL 1 MG/ML IJ SOLN
0.2500 mg | INTRAMUSCULAR | Status: DC | PRN
  Administered 2017-05-09: 0.5 mg via INTRAVENOUS
  Administered 2017-05-09: 0.25 mg via INTRAVENOUS

## 2017-05-09 MED ORDER — SUCCINYLCHOLINE CHLORIDE 200 MG/10ML IV SOSY
PREFILLED_SYRINGE | INTRAVENOUS | Status: AC
  Filled 2017-05-09: qty 10

## 2017-05-09 MED ORDER — OXYCODONE-ACETAMINOPHEN 5-325 MG PO TABS
ORAL_TABLET | ORAL | Status: AC
  Filled 2017-05-09: qty 1

## 2017-05-09 MED ORDER — CITALOPRAM HYDROBROMIDE 20 MG PO TABS: 20.0000 mg | ORAL_TABLET | Freq: Every day | ORAL | Status: DC

## 2017-05-09 MED ORDER — OXYCODONE HCL 5 MG PO TABS: 5.0000 mg | ORAL_TABLET | Freq: Once | ORAL | Status: DC | PRN

## 2017-05-09 MED ORDER — PANTOPRAZOLE SODIUM 40 MG PO TBEC: 40.0000 mg | DELAYED_RELEASE_TABLET | Freq: Every day | ORAL | Status: DC

## 2017-05-09 MED ORDER — POLYETHYLENE GLYCOL 3350 17 G PO PACK
17.0000 g | PACK | Freq: Every day | ORAL | Status: DC | PRN
  Filled 2017-05-09: qty 1

## 2017-05-09 MED ORDER — INDIGOTINDISULFONATE SODIUM 8 MG/ML IJ SOLN
INTRAMUSCULAR | Status: DC | PRN
  Administered 2017-05-09: 5 mL via INTRAVENOUS

## 2017-05-09 MED ORDER — SIMETHICONE 80 MG PO CHEW
CHEWABLE_TABLET | ORAL | Status: AC
  Filled 2017-05-09: qty 1

## 2017-05-09 MED ORDER — OXYCODONE-ACETAMINOPHEN 5-325 MG PO TABS
2.0000 | ORAL_TABLET | ORAL | Status: DC | PRN
  Administered 2017-05-10 (×2): 2 via ORAL

## 2017-05-09 MED ORDER — MIDAZOLAM HCL 2 MG/2ML IJ SOLN
INTRAMUSCULAR | Status: AC
  Filled 2017-05-09: qty 2

## 2017-05-09 MED ORDER — PROPOFOL 10 MG/ML IV BOLUS
INTRAVENOUS | Status: AC
  Filled 2017-05-09: qty 40

## 2017-05-09 MED ORDER — ROCURONIUM BROMIDE 10 MG/ML (PF) SYRINGE
PREFILLED_SYRINGE | INTRAVENOUS | Status: AC
  Filled 2017-05-09: qty 5

## 2017-05-09 MED ORDER — DEXAMETHASONE SODIUM PHOSPHATE 4 MG/ML IJ SOLN
INTRAMUSCULAR | Status: DC | PRN
  Administered 2017-05-09: 10 mg via INTRAVENOUS

## 2017-05-09 MED ORDER — METFORMIN HCL ER 750 MG PO TB24
2000.0000 mg | ORAL_TABLET | Freq: Every day | ORAL | Status: DC
  Administered 2017-05-09: 2000 mg via ORAL
  Filled 2017-05-09: qty 1

## 2017-05-09 MED ORDER — SIMETHICONE 80 MG PO CHEW
80.0000 mg | CHEWABLE_TABLET | Freq: Four times a day (QID) | ORAL | Status: DC | PRN
  Administered 2017-05-09 (×2): 80 mg via ORAL
  Filled 2017-05-09 (×3): qty 1

## 2017-05-09 MED ORDER — PROPOFOL 10 MG/ML IV BOLUS
INTRAVENOUS | Status: DC | PRN
  Administered 2017-05-09: 200 mg via INTRAVENOUS

## 2017-05-09 MED ORDER — HYDROMORPHONE HCL 1 MG/ML IJ SOLN
INTRAMUSCULAR | Status: AC
  Filled 2017-05-09: qty 1

## 2017-05-09 MED ORDER — DEXAMETHASONE SODIUM PHOSPHATE 10 MG/ML IJ SOLN
INTRAMUSCULAR | Status: AC
  Filled 2017-05-09: qty 1

## 2017-05-09 MED ORDER — DOCUSATE SODIUM 100 MG PO CAPS
100.0000 mg | ORAL_CAPSULE | Freq: Two times a day (BID) | ORAL | Status: DC
  Administered 2017-05-09: 100 mg via ORAL

## 2017-05-09 MED ORDER — CEFAZOLIN SODIUM-DEXTROSE 2-4 GM/100ML-% IV SOLN
2.0000 g | INTRAVENOUS | Status: AC
  Administered 2017-05-09: 2 g via INTRAVENOUS
  Filled 2017-05-09: qty 100

## 2017-05-09 MED ORDER — BUPIVACAINE HCL (PF) 0.5 % IJ SOLN
INTRAMUSCULAR | Status: AC
  Filled 2017-05-09: qty 30

## 2017-05-09 MED ORDER — SUGAMMADEX SODIUM 200 MG/2ML IV SOLN
INTRAVENOUS | Status: DC | PRN
  Administered 2017-05-09: 200 mg via INTRAVENOUS

## 2017-05-09 MED ORDER — SUGAMMADEX SODIUM 200 MG/2ML IV SOLN
INTRAVENOUS | Status: AC
  Filled 2017-05-09: qty 2

## 2017-05-09 MED ORDER — SODIUM CHLORIDE 0.9 % IV SOLN
INTRAVENOUS | Status: DC
Start: 2017-05-09 — End: 2017-05-09
  Administered 2017-05-09: 07:00:00 via INTRAVENOUS

## 2017-05-09 MED ORDER — KETOROLAC TROMETHAMINE 30 MG/ML IJ SOLN
INTRAMUSCULAR | Status: DC | PRN
  Administered 2017-05-09: 30 mg via INTRAVENOUS

## 2017-05-09 MED ORDER — ONDANSETRON HCL 4 MG PO TABS
4.0000 mg | ORAL_TABLET | Freq: Four times a day (QID) | ORAL | Status: DC | PRN
  Filled 2017-05-09: qty 1

## 2017-05-09 MED ORDER — PREGABALIN 75 MG PO CAPS: 75.0000 mg | ORAL_CAPSULE | Freq: Every day | ORAL | Status: DC | PRN

## 2017-05-09 SURGICAL SUPPLY — 71 items
APPLICATOR ARISTA FLEXITIP XL (MISCELLANEOUS) IMPLANT
APPLIER CLIP 5 13 M/L LIGAMAX5 (MISCELLANEOUS)
BARRIER ADHS 3X4 INTERCEED (GAUZE/BANDAGES/DRESSINGS) IMPLANT
CANISTER SUCT 3000ML PPV (MISCELLANEOUS) ×3 IMPLANT
CATH FOLEY 3WAY  5CC 16FR (CATHETERS)
CATH FOLEY 3WAY 5CC 16FR (CATHETERS) IMPLANT
CLIP APPLIE 5 13 M/L LIGAMAX5 (MISCELLANEOUS) IMPLANT
COVER BACK TABLE 60X90IN (DRAPES) ×3 IMPLANT
COVER TIP SHEARS 8 DVNC (MISCELLANEOUS) ×1 IMPLANT
COVER TIP SHEARS 8MM DA VINCI (MISCELLANEOUS) ×2
DECANTER SPIKE VIAL GLASS SM (MISCELLANEOUS) ×3 IMPLANT
DEFOGGER SCOPE WARMER CLEARIFY (MISCELLANEOUS) ×3 IMPLANT
DERMABOND ADVANCED (GAUZE/BANDAGES/DRESSINGS) ×2
DERMABOND ADVANCED .7 DNX12 (GAUZE/BANDAGES/DRESSINGS) ×1 IMPLANT
DRAPE ARM DVNC X/XI (DISPOSABLE) ×3 IMPLANT
DRAPE COLUMN DVNC XI (DISPOSABLE) ×1 IMPLANT
DRAPE DA VINCI XI ARM (DISPOSABLE) ×6
DRAPE DA VINCI XI COLUMN (DISPOSABLE) ×2
DURAPREP 26ML APPLICATOR (WOUND CARE) ×3 IMPLANT
ELECT REM PT RETURN 15FT ADLT (MISCELLANEOUS) ×3 IMPLANT
GLOVE BIO SURGEON STRL SZ7 (GLOVE) ×3 IMPLANT
GLOVE BIOGEL PI IND STRL 7.0 (GLOVE) ×5 IMPLANT
GLOVE BIOGEL PI INDICATOR 7.0 (GLOVE) ×10
GLOVE SURG SS PI 6.5 STRL IVOR (GLOVE) ×12 IMPLANT
GLOVE SURG SS PI 7.0 STRL IVOR (GLOVE) ×6 IMPLANT
GOWN STRL REUS W/TWL XL LVL3 (GOWN DISPOSABLE) ×3 IMPLANT
GYRUS RUMI II 2.5CM BLUE (DISPOSABLE)
GYRUS RUMI II 3.5CM BLUE (DISPOSABLE)
GYRUS RUMI II 4.0CM BLUE (DISPOSABLE)
HEMOSTAT ARISTA ABSORB 3G PWDR (MISCELLANEOUS) IMPLANT
IRRIG SUCT STRYKERFLOW 2 WTIP (MISCELLANEOUS) ×3
IRRIGATION SUCT STRKRFLW 2 WTP (MISCELLANEOUS) ×1 IMPLANT
NEEDLE INSUFFLATION 120MM (ENDOMECHANICALS) ×3 IMPLANT
OBTURATOR OPTICAL STANDARD 8MM (TROCAR)
OBTURATOR OPTICAL STND 8 DVNC (TROCAR)
OBTURATOR OPTICALSTD 8 DVNC (TROCAR) IMPLANT
OCCLUDER COLPOPNEUMO (BALLOONS) ×3 IMPLANT
PACK ROBOT WH (CUSTOM PROCEDURE TRAY) ×3 IMPLANT
PACK ROBOTIC GOWN (GOWN DISPOSABLE) ×3 IMPLANT
PACK TRENDGUARD 450 HYBRID PRO (MISCELLANEOUS) ×1 IMPLANT
PAD PREP 24X48 CUFFED NSTRL (MISCELLANEOUS) ×3 IMPLANT
POSITIONER SURGICAL ARM (MISCELLANEOUS) ×6 IMPLANT
RUMI II 3.0CM BLUE KOH-EFFICIE (DISPOSABLE) IMPLANT
RUMI II GYRUS 2.5CM BLUE (DISPOSABLE) IMPLANT
RUMI II GYRUS 3.5CM BLUE (DISPOSABLE) IMPLANT
RUMI II GYRUS 4.0CM BLUE (DISPOSABLE) IMPLANT
SCISSORS LAP 5X35 DISP (ENDOMECHANICALS) ×3 IMPLANT
SEAL CANN UNIV 5-8 DVNC XI (MISCELLANEOUS) ×3 IMPLANT
SEAL XI 5MM-8MM UNIVERSAL (MISCELLANEOUS) ×6
SEALER VESSEL DA VINCI XI (MISCELLANEOUS) ×2
SEALER VESSEL EXT DVNC XI (MISCELLANEOUS) ×1 IMPLANT
SET CYSTO W/LG BORE CLAMP LF (SET/KITS/TRAYS/PACK) ×3 IMPLANT
SET TRI-LUMEN FLTR TB AIRSEAL (TUBING) ×3 IMPLANT
SLEEVE SURGEON STRL (DRAPES) ×3 IMPLANT
SUT VIC AB 0 CT1 27 (SUTURE) ×4
SUT VIC AB 0 CT1 27XBRD ANBCTR (SUTURE) ×2 IMPLANT
SUT VICRYL 0 UR6 27IN ABS (SUTURE) ×6 IMPLANT
SUT VICRYL 4-0 PS2 18IN ABS (SUTURE) ×6 IMPLANT
SUT VLOC 180 0 9IN  GS21 (SUTURE) ×2
SUT VLOC 180 0 9IN GS21 (SUTURE) ×1 IMPLANT
SYSTEM CARTER THOMASON II (TROCAR) IMPLANT
TIP RUMI ORANGE 6.7MMX12CM (TIP) IMPLANT
TIP UTERINE 5.1X6CM LAV DISP (MISCELLANEOUS) IMPLANT
TIP UTERINE 6.7X10CM GRN DISP (MISCELLANEOUS) ×3 IMPLANT
TIP UTERINE 6.7X6CM WHT DISP (MISCELLANEOUS) IMPLANT
TIP UTERINE 6.7X8CM BLUE DISP (MISCELLANEOUS) IMPLANT
TOWEL OR 17X26 10 PK STRL BLUE (TOWEL DISPOSABLE) ×6 IMPLANT
TRENDGUARD 450 HYBRID PRO PACK (MISCELLANEOUS) ×3
TROCAR PORT AIRSEAL 5X120 (TROCAR) ×3 IMPLANT
TROCAR XCEL NON BLADE 8MM B8LT (ENDOMECHANICALS) ×3 IMPLANT
WATER STERILE IRR 1000ML POUR (IV SOLUTION) ×3 IMPLANT

## 2017-05-09 NOTE — Op Note (Signed)
05/09/2017  10:35 AM  PATIENT:  Renee James  47 y.o. female  PRE-OPERATIVE DIAGNOSIS:  AUB Pelvic Pain Ovarian Cyst Fibroids  POST-OPERATIVE DIAGNOSIS:  AUB Pelvic Pain; fibroids  PROCEDURE:  Procedure(s): XI ROBOTIC ASSISTED LAPAROSCOPIC HYSTERECTOMY AND Left SALPINGECTOMY, Lysis of Adhesions, cystoscopy (Bilateral)  SURGEON:  Surgeon(s) and Role:    * Lavonia Drafts, MD - Primary    * Dove, Wilhemina Cash, MD - Assisting  ANESTHESIA:   local and general  EBL:  50 mL   BLOOD ADMINISTERED:none  DRAINS: none   LOCAL MEDICATIONS USED:  MARCAINE     SPECIMEN:  Source of Specimen:  uterus, left fallopian tube and cervix   DISPOSITION OF SPECIMEN:  PATHOLOGY  COUNTS:  YES  TOURNIQUET:  * No tourniquets in log *  DICTATION: .Note written in EPIC  PLAN OF CARE: Admit for overnight observation  PATIENT DISPOSITION:  PACU - hemodynamically stable.   Delay start of Pharmacological VTE agent (>24hrs) due to surgical blood loss or risk of bleeding: no  Complications: none immediate  Findings: The uterus is enlarged due ot fibroids. ;  The right fallopian tube was prev surgically removed.  There were adhesions of omentum to the anterior abd wall. There were adhesion of bowel and peritoneum to the side wall on the left side.       The risks, benefits, and alternatives of surgery were explained, understood, and accepted. Consents were signed. All questions were answered. She was taken to the operating room and general anesthesia was applied without complication. She was placed in the dorsal lithotomy position and her abdomen and vagina were prepped and draped after she had been carefully positioned on the table. A bimanual exam revealed a 12 week size uterus that was mobile. Her adnexa were not enlarged. The cervix was measured and the uterus was sounded to 11 cm. A Rumi uterine manipulator was placed without difficulty. A Foley catheter was placed and it drained clear throughout  the case. Gloves were changed and attention was turned to the abdomen. A 79mm incision was made in the umbilicus and a Veress needle was placed intraperitoneally. CO2 was used to insufflate the abdomen to approximately 4 L. After good pneumoperitoneum was established, a 8 mm trocar was placed 4 cm above the umbilicus.  Laparoscopy confirmed correct placement. She was placed in Trendelenburg position and ports were placed in appropriate positions on her abdomen to allow maximum exposure during the robotic case. Specifically there was a 87mm assistant port placed in the left upper quadrant under direct laproscopic visualization. Two 8 mm ports were placed 8cm lateral to the midline port.  These were all placed under direct laparoscopic visualization. There were adhesions of omentum to anterior abdominal wall that had to be released prior to placing the 8 mm port on the right side.  This was done using curved shears. After the ports were placed, the robot was docked and I proceeded with a robotic portion of the case.  The pelvis was inspected and the uterus was found to have fibroids and be enlarged and had an irregular contour. The above findings were noted.   The fallopian tube on the right side was found to be surgically excised previously.  The left fallopian tube and both ovaries were found to be WNL.  The ureters and the infundibulopelvic ligaments were identified. I excised the fallopian tubes bilaterally. The round ligament on each side was cauterized and cut. The Vessel Sealer instrument was used for this portion. The  round ligaments were identified, cauterized and ligated, a bladder flap was created anteriorly. The uterine vessels were identified and cauterized and then cut.The bladder was pushed out of the operative site and an anterior colpotomy was made. The colpotomy incision was extended circumferentially, following the blue outline of the Rumi manipulator. All pedicles were hemostatic.  The uterus was  removed from the vagina with the fallopian tube segments. The vaginal cuff was closed with v-lock suture.  Excellent hemostasis was noted throughout. The pelvis was irrigated. The intraabdominal pressure was lowered assess hemostasis. After determining excellent hemostasis, the robot was undocked. At this point I performed cystoscopy. The cystoscopy revealed blue ejection from both ureters. The robot was undocked. The skin incisions  from all of the ports were closed with 3-0 vicryl. 30cc of 0.5% Marcaine was injected into the port sites.  The patient was then extubated and taken to recovery in stable condition.   Sponge, lap and needle counts were correct x 2.  An experienced assistant was required given the standard of surgical care given the complexity of the case.  This assistant was needed for exposure, dissection, suctioning, retraction, instrument exchange, assisting with delivery with administration of fundal pressure, and for overall help during the procedure.  Alanna Storti L. Harraway-Smith, M.D., Cherlynn June

## 2017-05-09 NOTE — Anesthesia Postprocedure Evaluation (Signed)
Anesthesia Post Note  Patient: Renee James  Procedure(s) Performed: XI ROBOTIC ASSISTED LAPAROSCOPIC HYSTERECTOMY AND Left SALPINGECTOMY, Lysis of Adhesions, cystoscopy (Bilateral Abdomen)     Patient location during evaluation: PACU Anesthesia Type: General Level of consciousness: awake Pain management: pain level controlled Vital Signs Assessment: post-procedure vital signs reviewed and stable Respiratory status: spontaneous breathing Cardiovascular status: stable Anesthetic complications: no    Last Vitals:  Vitals:   05/09/17 1013 05/09/17 1015  BP: (!) 149/81 (!) 144/75  Pulse: 90 84  Resp: 19 13  Temp: 37.1 C   SpO2: 91% 94%    Last Pain:  Vitals:   05/09/17 1015  TempSrc:   PainSc: Asleep                 Laporchia Nakajima

## 2017-05-09 NOTE — Anesthesia Postprocedure Evaluation (Signed)
Anesthesia Post Note  Patient: Delila Pereyra  Procedure(s) Performed: XI ROBOTIC ASSISTED LAPAROSCOPIC HYSTERECTOMY AND Left SALPINGECTOMY, Lysis of Adhesions, cystoscopy (Bilateral Abdomen)     Patient location during evaluation: PACU Anesthesia Type: General Level of consciousness: awake and alert Pain management: pain level controlled Vital Signs Assessment: post-procedure vital signs reviewed and stable Respiratory status: spontaneous breathing, nonlabored ventilation, respiratory function stable and patient connected to nasal cannula oxygen Cardiovascular status: blood pressure returned to baseline and stable Postop Assessment: no apparent nausea or vomiting Anesthetic complications: no    Last Vitals:  Vitals:   05/09/17 1030 05/09/17 1045  BP: (!) 142/79 (!) 147/88  Pulse: 88 83  Resp: 17 17  Temp:    SpO2: 99% 99%    Last Pain:  Vitals:   05/09/17 1050  TempSrc:   PainSc: Encinal Markham Dumlao

## 2017-05-09 NOTE — Progress Notes (Signed)
05/09/2017 1745 Attempted to page MD regarding low UOP this afternoon. Will await callback.  Ayjah Show, Arville Lime

## 2017-05-09 NOTE — Transfer of Care (Signed)
Immediate Anesthesia Transfer of Care Note  Patient: Renee James  Procedure(s) Performed: Procedure(s) (LRB): XI ROBOTIC ASSISTED LAPAROSCOPIC HYSTERECTOMY AND Left SALPINGECTOMY, Lysis of Adhesions, cystoscopy (Bilateral)  Patient Location: PACU  Anesthesia Type: General  Level of Consciousness: awake, sedated, patient cooperative and responds to stimulation  Airway & Oxygen Therapy: Patient Spontanous Breathing and Patient connected to face mask oxygen  Post-op Assessment: Report given to PACU RN, Post -op Vital signs reviewed and stable and Patient moving all extremities  Post vital signs: Reviewed and stable  Complications: No apparent anesthesia complications

## 2017-05-09 NOTE — Progress Notes (Signed)
05/09/2017 Dr. Ihor Dow on floor to see patient. Orders received ok to d/c foley but d/c IV Toradol order. Orders enacted. Will continue to closely monitor patient.  Aimy Sweeting, Arville Lime

## 2017-05-09 NOTE — Brief Op Note (Signed)
05/09/2017  10:35 AM  PATIENT:  Renee James  47 y.o. female  PRE-OPERATIVE DIAGNOSIS:  AUB Pelvic Pain Ovarian Cyst Fibroids  POST-OPERATIVE DIAGNOSIS:  AUB Pelvic Pain  PROCEDURE:  Procedure(s): XI ROBOTIC ASSISTED LAPAROSCOPIC HYSTERECTOMY AND Left SALPINGECTOMY, Lysis of Adhesions, cystoscopy (Bilateral)  SURGEON:  Surgeon(s) and Role:    * Lavonia Drafts, MD - Primary    * Dove, Wilhemina Cash, MD - Assisting  ANESTHESIA:   local and general  EBL:  50 mL   BLOOD ADMINISTERED:none  DRAINS: none   LOCAL MEDICATIONS USED:  MARCAINE     SPECIMEN:  Source of Specimen:  uterus, left fallopian tube and cervix   DISPOSITION OF SPECIMEN:  PATHOLOGY  COUNTS:  YES  TOURNIQUET:  * No tourniquets in log *  DICTATION: .Note written in EPIC  PLAN OF CARE: Admit for overnight observation  PATIENT DISPOSITION:  PACU - hemodynamically stable.   Delay start of Pharmacological VTE agent (>24hrs) due to surgical blood loss or risk of bleeding: no  Complications: none immediate  Camp Gopal L. Harraway-Smith, M.D., Cherlynn June

## 2017-05-09 NOTE — Anesthesia Procedure Notes (Signed)
Procedure Name: Intubation Date/Time: 05/09/2017 7:33 AM Performed by: Justice Rocher, CRNA Pre-anesthesia Checklist: Patient identified, Emergency Drugs available, Suction available and Patient being monitored Patient Re-evaluated:Patient Re-evaluated prior to induction Oxygen Delivery Method: Circle system utilized Preoxygenation: Pre-oxygenation with 100% oxygen Induction Type: IV induction Ventilation: Mask ventilation without difficulty Laryngoscope Size: Mac and 4 Grade View: Grade II Tube type: Oral Tube size: 7.5 mm Number of attempts: 1 Airway Equipment and Method: Stylet and Oral airway Placement Confirmation: ETT inserted through vocal cords under direct vision,  positive ETCO2 and breath sounds checked- equal and bilateral Secured at: 22 cm Tube secured with: Tape Dental Injury: Teeth and Oropharynx as per pre-operative assessment

## 2017-05-09 NOTE — Discharge Instructions (Signed)

## 2017-05-09 NOTE — Progress Notes (Signed)
05/09/2017 6:22 PM Attempted to contact MD regarding low UOP. Office to page on call MD. Will await callback.  Carless Slatten, Arville Lime

## 2017-05-09 NOTE — Addendum Note (Signed)
Addendum  created 05/09/17 1054 by Audry Pili, MD   Sign clinical note

## 2017-05-10 ENCOUNTER — Encounter (HOSPITAL_COMMUNITY): Payer: Self-pay | Admitting: Obstetrics & Gynecology

## 2017-05-10 ENCOUNTER — Encounter: Payer: Managed Care, Other (non HMO) | Admitting: Obstetrics & Gynecology

## 2017-05-10 DIAGNOSIS — N938 Other specified abnormal uterine and vaginal bleeding: Secondary | ICD-10-CM | POA: Diagnosis not present

## 2017-05-10 LAB — BASIC METABOLIC PANEL
Anion gap: 12 (ref 5–15)
BUN: 27 mg/dL — AB (ref 6–20)
CHLORIDE: 102 mmol/L (ref 101–111)
CO2: 23 mmol/L (ref 22–32)
CREATININE: 1.72 mg/dL — AB (ref 0.44–1.00)
Calcium: 8 mg/dL — ABNORMAL LOW (ref 8.9–10.3)
GFR calc Af Amer: 39 mL/min — ABNORMAL LOW (ref >60)
GFR calc non Af Amer: 34 mL/min — ABNORMAL LOW (ref >60)
Glucose, Bld: 136 mg/dL — ABNORMAL HIGH (ref 65–99)
Potassium: 4.3 mmol/L (ref 3.5–5.1)
SODIUM: 137 mmol/L (ref 135–145)

## 2017-05-10 LAB — CBC
HCT: 32.9 % — ABNORMAL LOW (ref 36.0–46.0)
HEMOGLOBIN: 10.8 g/dL — AB (ref 12.0–15.0)
MCH: 28.1 pg (ref 26.0–34.0)
MCHC: 32.8 g/dL (ref 30.0–36.0)
MCV: 85.7 fL (ref 78.0–100.0)
PLATELETS: 299 10*3/uL (ref 150–400)
RBC: 3.84 MIL/uL — ABNORMAL LOW (ref 3.87–5.11)
RDW: 15.4 % (ref 11.5–15.5)
WBC: 18 10*3/uL — ABNORMAL HIGH (ref 4.0–10.5)

## 2017-05-10 MED ORDER — OXYCODONE-ACETAMINOPHEN 5-325 MG PO TABS
ORAL_TABLET | ORAL | Status: AC
  Filled 2017-05-10: qty 1

## 2017-05-10 MED ORDER — OXYCODONE-ACETAMINOPHEN 5-325 MG PO TABS: 1.0000 | ORAL_TABLET | ORAL | 0 refills | Status: DC | PRN

## 2017-05-10 MED ORDER — OXYCODONE-ACETAMINOPHEN 5-325 MG PO TABS
ORAL_TABLET | ORAL | Status: AC
  Filled 2017-05-10: qty 2

## 2017-05-10 MED ORDER — ENOXAPARIN SODIUM 40 MG/0.4ML ~~LOC~~ SOLN
SUBCUTANEOUS | Status: AC
  Filled 2017-05-10: qty 0.4

## 2017-05-10 MED ORDER — DOCUSATE SODIUM 100 MG PO CAPS: 100.0000 mg | ORAL_CAPSULE | Freq: Two times a day (BID) | ORAL | 0 refills | Status: DC

## 2017-05-10 NOTE — Discharge Summary (Signed)
Physician Discharge Summary  Patient ID: Renee James MRN: [id number] DOB/AGE: [birth year] 47 y.o.  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 Furthermore, the patient had an uncomplicated postoperative course.  By time of discharge, her pain was controlled on oral pain medications; she was ambulating, voiding without difficulty, tolerating regular diet and passing flatus.  She was deemed stable for discharge to home.    Consults: None  Significant Diagnostic Studies: labs: CBC, BMP  Treatments: surgery: Robot assisted total laparoscopic hysterectomy with left salpingectomy  Discharge Exam: Blood pressure 116/68, pulse 80, temperature 98.6 F (37 C), temperature source Oral, resp. rate 16, height '5\' 3"'$  (1.6 m), weight 197 lb (89.4 kg), last menstrual period 05/09/2017, SpO2 98 %. General appearance: alert and no distress Resp: clear to auscultation bilaterally Cardio: regular rate and rhythm, S1, S2 normal, no murmur, click, rub or gallop GI: soft, non-tender; bowel sounds normal; no masses,  no organomegaly and ports sites healing well with no complications  Extremities: extremities normal, atraumatic, no cyanosis or edema Skin: Skin color, texture, turgor normal. No rashes or lesions  Disposition: discharge to home/self  care  Discharge Instructions    Call MD for:  difficulty breathing, headache or visual disturbances   Complete by:  As directed    Call MD for:  extreme fatigue   Complete by:  As directed    Call MD for:  hives   Complete by:  As directed    Call MD for:  persistant dizziness or light-headedness   Complete by:  As directed    Call MD for:  persistant nausea and vomiting   Complete by:  As directed    Call MD for:  redness, tenderness, or signs of infection (pain, swelling, redness, odor or green/yellow discharge around incision site)   Complete by:  As directed    Call MD for:  severe uncontrolled pain   Complete by:  As directed    Call MD for:  temperature >100.4   Complete by:  As directed    Diet - low sodium heart healthy   Complete by:  As directed    Discharge wound care:   Complete by:  As directed    Keep port sites clean and dry   Driving Restrictions   Complete by:  As directed    No driving for 2 weeks   Increase activity slowly   Complete by:  As directed    Lifting restrictions   Complete by:  As directed    No lifting for 6 weeks   Sexual Activity Restrictions   Complete by:  As directed    NO sexual intercourse for 8 weeks     Allergies as of 05/10/2017      Reactions   Latex Hives, Shortness Of Breath, Itching   Talc Hives, Shortness Of Breath, Itching   Ace Inhibitors Cough  Other Swelling, Other (See Comments)   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   citalopram 20 MG tablet Commonly known as:  CELEXA Take 1 tablet (20 mg total) by mouth  daily.   dextromethorphan-guaiFENesin 10-100 MG/5ML liquid Commonly known as:  ROBITUSSIN-DM Take 10 mLs by mouth every 4 (four) hours as needed for cough.   docusate sodium 100 MG capsule Commonly known as:  COLACE Take 1 capsule (100 mg total) by mouth 2 (two) times daily.   ergocalciferol 50000 units capsule Commonly known as:  VITAMIN D2 Take 50,000 Units by mouth every Monday.   glucose blood test strip Commonly known as:  ONETOUCH VERIO USE AS DIRECTED TO TEST BLOOD SUGARS DAILY DX: E11.9   hydrochlorothiazide 25 MG tablet Commonly known as:  HYDRODIURIL Take 1 tablet (25 mg total) by mouth daily.   Insulin Detemir 100 UNIT/ML Pen Commonly known as:  LEVEMIR Inject 10 Units into the skin daily at 10 pm. Check your morning blood sugar every day. If morning blood sugars aren't at goal (100), then increase the amount of evening levemir insulin by 1 unit every night until fasting blood sugars  goal of less than 100 is reached. What changed:    how much to take  when to take this  additional instructions   losartan 100 MG tablet Commonly known as:  COZAAR Take 1 tablet (100 mg total) by mouth daily.   metFORMIN 500 MG 24 hr tablet Commonly known as:  GLUCOPHAGE-XR Take 4 tablets (2,000 mg total) by mouth daily.   Nystatin Powd Apply to skin lesions 2 to 3 times a day   onetouch ultrasoft lancets USE AS DIRECTED TO TEST BLOOD SUGARS DAILY DX: E11.9   ONETOUCH VERIO FLEX SYSTEM w/Device Kit 1 kit by Does not apply route as directed. USE AS DIRECTED TO TEST BLOOD SUGARS DAILY DX: E11.9   oxyCODONE-acetaminophen 5-325 MG tablet Commonly known as:  PERCOCET/ROXICET Take 1 tablet by mouth every 4 (four) hours as needed for moderate pain ((when tolerating fluids)).   pioglitazone 30 MG tablet Commonly known as:  ACTOS Take 30 mg by mouth daily.   pravastatin 20 MG tablet Commonly known as:  PRAVACHOL Take 20 mg by mouth daily.   pregabalin 75 MG  capsule Commonly known as:  LYRICA Take 75 mg by mouth daily as needed (for pain).   torsemide 20 MG tablet Commonly known as:  DEMADEX Take 20 mg by mouth daily.            Discharge Care Instructions  (From admission, onward)        Start     Ordered   05/10/17 0000  Discharge wound care:    Comments:  Keep port sites clean and dry   05/10/17 0848     Follow-up Information    [facility].   Contact information: [address], [place] [phone]          Signed: Lavonia Drafts 05/10/2017, 4:28 PM

## 2017-05-15 ENCOUNTER — Other Ambulatory Visit: Payer: Self-pay | Admitting: Obstetrics & Gynecology

## 2017-05-16 MED ORDER — OXYCODONE-ACETAMINOPHEN 5-325 MG PO TABS: 1.0000 | ORAL_TABLET | ORAL | 0 refills | Status: DC | PRN

## 2017-05-17 ENCOUNTER — Other Ambulatory Visit: Payer: Self-pay | Admitting: Family Medicine

## 2017-05-17 DIAGNOSIS — I10 Essential (primary) hypertension: Secondary | ICD-10-CM

## 2017-05-22 ENCOUNTER — Ambulatory Visit (INDEPENDENT_AMBULATORY_CARE_PROVIDER_SITE_OTHER): Payer: Managed Care, Other (non HMO) | Admitting: Obstetrics & Gynecology

## 2017-05-22 ENCOUNTER — Encounter: Payer: Self-pay | Admitting: Obstetrics & Gynecology

## 2017-05-22 VITALS — BP 136/99 | HR 99 | Ht 63.0 in | Wt 192.0 lb

## 2017-05-22 DIAGNOSIS — Z9889 Other specified postprocedural states: Secondary | ICD-10-CM

## 2017-05-22 MED ORDER — METRONIDAZOLE 500 MG PO TABS: 500.0000 mg | ORAL_TABLET | Freq: Two times a day (BID) | ORAL | 0 refills | Status: DC

## 2017-05-22 MED ORDER — AMOXICILLIN-POT CLAVULANATE 875-125 MG PO TABS: 1.0000 | ORAL_TABLET | Freq: Two times a day (BID) | ORAL | 0 refills | Status: DC

## 2017-05-22 NOTE — Progress Notes (Addendum)
History:  47 y.o. [id number] here today for 2 weeks post op check. Pts son was shot 3 days prev and is on life supoprt at [facility] in [place].  She reports vag odor. No bleeding or discharge  The following portions of the patient's history were reviewed and updated as appropriate: allergies, current medications, past family history, past medical history, past social history, past surgical history and problem list.  Review of Systems:  Pertinent items are noted in HPI.    Objective:  Physical Exam Blood pressure (!) 136/99, pulse 99, height 5\' 3"  (1.6 m), weight 192 lb (87.1 kg), last menstrual period 05/09/2017. Gen: NAD Abd: Soft, nontender and nondistended Pelvic: Normal appearing external genitalia; normal appearing vaginal mucosa and cervix.  Normal discharge.  Small uterus, no other palpable masses, no uterine or adnexal tenderness  Labs and Imaging 05/09/2017 Diagnosis Uterus and cervix, left fallopian tube - UTERUS: -ENDOMETRIUM: ENDOMETRIAL TYPE POLYP. WEAKLY SECRETORY ENDOMETRIUM. NO HYPERPLASIA OR MALIGNANCY. -MYOMETRIUM: LEIOMYOMA. NO MALIGNANCY. -SEROSA: FIBROUS ADHESIONS. NO MALIGNANCY. - CERVIX: BENIGN SQUAMOUS AND ENDOCERVICAL MUCOSA. NO DYSPLASIA OR MALIGNANCY. - LEFT FALLOPIAN TUBE: PARATUBAL CYSTS. Assessment & Plan:  2 week post op state after RATH. Doing well from surgery  Gradual increase in activities  Reviewed surg path   Flagyl 500mg  bid x 7 DAYS  Augmentin 875 mg bid x 7 days  condolences on condition of son. Discussed with pt and partner outcome of son.   Maryem Shuffler L. Harraway-Smith, M.D., Cherlynn June

## 2017-05-22 NOTE — Progress Notes (Signed)
2 week post op.  [name] RN

## 2017-05-22 NOTE — Patient Instructions (Signed)

## 2017-06-12 ENCOUNTER — Encounter: Payer: Self-pay | Admitting: Obstetrics & Gynecology

## 2017-06-12 ENCOUNTER — Ambulatory Visit (INDEPENDENT_AMBULATORY_CARE_PROVIDER_SITE_OTHER): Payer: Managed Care, Other (non HMO) | Admitting: Obstetrics & Gynecology

## 2017-06-12 VITALS — BP 138/73 | HR 66 | Ht 63.0 in | Wt 191.0 lb

## 2017-06-12 DIAGNOSIS — F332 Major depressive disorder, recurrent severe without psychotic features: Secondary | ICD-10-CM

## 2017-06-12 DIAGNOSIS — Z9889 Other specified postprocedural states: Secondary | ICD-10-CM

## 2017-06-12 DIAGNOSIS — F4321 Adjustment disorder with depressed mood: Secondary | ICD-10-CM

## 2017-06-12 NOTE — Progress Notes (Signed)
History:  47 y.o. [id number] here today for 5 week post op check. Pt is s/p RATH with bilateral salpingectomy. Pt reports occ sharp pains in pelvis. No pain meds required. Pt was prescribed atbx last visit which she did not take.   She is voiding and passing stools without difficulty.   The day after her last visit her son died. The service was last week. Pt has a h/o depression since age 63 years. She is off meds at present. Her SIL is with her today and reports that pt has been crying and requires full time assist.  She denies suicidal or homicidal ideation.  The following portions of the patient's history were reviewed and updated as appropriate: allergies, current medications, past family history, past medical history, past social history, past surgical history and problem list.  Review of Systems:  Pertinent items are noted in HPI.    Objective:  Physical Exam Blood pressure 138/73, pulse 66, height 5\' 3"  (1.6 m), weight 191 lb (86.6 kg).  CONSTITUTIONAL: Well-developed, well-nourished female in no acute distress. Pt did smile at times throughout the exam and occ was teary in discussing sensitive issues.   HENT:  Normocephalic, atraumatic EYES: Conjunctivae and EOM are normal. No scleral icterus.  NECK: Normal range of motion SKIN: Skin is warm and dry. No rash noted. Not diaphoretic.No pallor. [place]: Alert and oriented to person, place, and time. Normal coordination.  Abd: obese, soft, NT, ND. Ports sites healing well Pelvic: Normal appearing external genitalia; normal appearing vaginal mucosa. There are some sutures still noted but, there is no abnormal discharge or erytehma.  Normal discharge.  No palpable masses or dnexal tenderness   Assessment & Plan:  5 week post op check  Doing well post surgically   F/u in 3 months   May RTW in 3 weeks   Depression exacerbated by situational depression- as p[t is on mult other meds and has baseline health issues. Will refer pt back to  primary care to make rec for meds for depression.  Will f/u with Dr. Edilia Bo next week.  Demmi Sindt L. Harraway-Smith, M.D., Cherlynn June

## 2017-06-12 NOTE — Progress Notes (Signed)
Patient states she has a pulling sensation and sharp pains in her abdomen. [name] RN
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
## 2017-06-14 ENCOUNTER — Ambulatory Visit: Payer: Managed Care, Other (non HMO) | Admitting: Family Medicine

## 2017-06-14 ENCOUNTER — Encounter: Payer: Self-pay | Admitting: Family Medicine

## 2017-06-14 VITALS — BP 118/88 | HR 80 | Resp 16 | Ht 63.0 in | Wt 192.0 lb

## 2017-06-14 DIAGNOSIS — Z634 Disappearance and death of family member: Secondary | ICD-10-CM | POA: Diagnosis not present

## 2017-06-14 DIAGNOSIS — F4323 Adjustment disorder with mixed anxiety and depressed mood: Secondary | ICD-10-CM

## 2017-06-14 DIAGNOSIS — G4700 Insomnia, unspecified: Secondary | ICD-10-CM | POA: Diagnosis not present

## 2017-06-14 DIAGNOSIS — F4321 Adjustment disorder with depressed mood: Secondary | ICD-10-CM

## 2017-06-14 MED ORDER — ALPRAZOLAM 0.25 MG PO TABS: 0.2500 mg | ORAL_TABLET | Freq: Every evening | ORAL | 0 refills | Status: DC | PRN

## 2017-06-14 MED ORDER — CITALOPRAM HYDROBROMIDE 20 MG PO TABS: 20.0000 mg | ORAL_TABLET | Freq: Every day | ORAL | 3 refills | Status: AC

## 2017-06-14 NOTE — Patient Instructions (Signed)
It was good to see you today, but I am so sorry for the tragic loss of your son.  When you feel ready, a support group such as the Compassionate Friends network may be helpful for you  [place]  [place]  Email: [email or web address] Phone Contact: Becky [phone] [place]: [place], [place] Meeting Info: 1st Monday of each month 7:00 pm Chapter Number: [id number]  We are going to start you back on celexa- take a 1/2 pill for a week, then go to a full pill I also gave you some xanax to use as needed for your insomnia.  Remember this can be habit forming so we will want to use for a short a time as possible.    I would suggest that you apply for short term disability from work if you wish- I am glad to help with any paperwork  Please see me in one month to check in

## 2017-06-27 ENCOUNTER — Telehealth: Payer: Self-pay | Admitting: Family Medicine

## 2017-06-27 NOTE — Telephone Encounter (Signed)
Patient's cousin Terri speaking for patient stating that patient is scheduled to return to work tomorrow. Patient has a letter from [place] [employer] extending [employer] leave to 06/28/17 for another issue. Patient states that she has not received the new disability paper work that [place] advised to obtain & agreed to fill out. Patient is requesting that Dr. Lorelei Pont write her out from work until she receives the paperwork & they are completed by provider . Patient advised it takes 5-7 business days. Pt aware that Dr. Lorelei Pont returns to office tomorrow & is requesting update ASAP & letter written. Please advise.
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
## 2017-06-28 NOTE — Telephone Encounter (Signed)
fyi

## 2017-06-30 NOTE — Telephone Encounter (Signed)
Patient  Dropped off FMLA paperwork Renee James HR requesting to have this returned to her by 07/12/17 & faxed to her attn at [phone]. In [place] tray up front.
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
## 2017-07-07 NOTE — Telephone Encounter (Signed)
Completed as much as possible; forwarded to provider, scheduled to RTO on Mon, 07/10/17//SLS 07/05

## 2017-07-10 ENCOUNTER — Other Ambulatory Visit: Payer: Self-pay

## 2017-07-10 ENCOUNTER — Encounter: Payer: Self-pay | Admitting: Family Medicine

## 2017-07-10 ENCOUNTER — Ambulatory Visit: Payer: Managed Care, Other (non HMO) | Admitting: Family Medicine

## 2017-07-10 VITALS — BP 138/90 | HR 77 | Temp 99.0°F | Resp 16 | Ht 63.0 in | Wt 194.0 lb

## 2017-07-10 DIAGNOSIS — G4700 Insomnia, unspecified: Secondary | ICD-10-CM

## 2017-07-10 DIAGNOSIS — G8929 Other chronic pain: Secondary | ICD-10-CM | POA: Diagnosis not present

## 2017-07-10 DIAGNOSIS — F4323 Adjustment disorder with mixed anxiety and depressed mood: Secondary | ICD-10-CM | POA: Diagnosis not present

## 2017-07-10 DIAGNOSIS — Z634 Disappearance and death of family member: Secondary | ICD-10-CM | POA: Diagnosis not present

## 2017-07-10 DIAGNOSIS — F4321 Adjustment disorder with depressed mood: Secondary | ICD-10-CM

## 2017-07-10 DIAGNOSIS — M5136 Other intervertebral disc degeneration, lumbar region: Secondary | ICD-10-CM | POA: Diagnosis not present

## 2017-07-10 DIAGNOSIS — M5416 Radiculopathy, lumbar region: Secondary | ICD-10-CM | POA: Diagnosis not present

## 2017-07-10 NOTE — Patient Instructions (Addendum)
It was good to see you today- again, I am so sorry about the loss of your son Renee James.  We will plan to have you return to work on 08/03/17.    Please call the HR lady with [employer] [place] to touch base  I will set up an MRI of your back to evaluate your lower back pain and numbness/ tingling that you have noticed

## 2017-07-11 NOTE — Telephone Encounter (Signed)
Informed patient via phone conversation that her FMLA paperwork was successfully faxed to [place] [employer] to [employer]. Asked her to let us know if her employer and/or herself needs anything further from [place] to please not hesitate to call; pt understood and agreed/SLS 06/01

## 2017-08-07 ENCOUNTER — Ambulatory Visit
Admission: RE | Admit: 2017-08-07 | Discharge: 2017-08-07 | Disposition: A | Discharge status: Home / Self Care | Payer: Managed Care, Other (non HMO) | Source: Ambulatory Visit | Attending: Family Medicine | Admitting: Family Medicine

## 2017-08-07 DIAGNOSIS — M5136 Other intervertebral disc degeneration, lumbar region: Secondary | ICD-10-CM

## 2017-08-08 ENCOUNTER — Encounter: Payer: Self-pay | Admitting: Family Medicine

## 2017-08-08 ENCOUNTER — Other Ambulatory Visit: Payer: Self-pay | Admitting: Family Medicine

## 2017-08-08 DIAGNOSIS — M5416 Radiculopathy, lumbar region: Principal | ICD-10-CM

## 2017-08-08 DIAGNOSIS — G8929 Other chronic pain: Secondary | ICD-10-CM

## 2017-08-11 ENCOUNTER — Other Ambulatory Visit: Payer: Self-pay

## 2017-08-11 ENCOUNTER — Emergency Department (HOSPITAL_BASED_OUTPATIENT_CLINIC_OR_DEPARTMENT_OTHER)
Admission: EM | Admit: 2017-08-11 | Discharge: 2017-08-11 | Disposition: A | Discharge status: Home / Self Care | Payer: Managed Care, Other (non HMO) | Source: Home / Self Care | Attending: Emergency Medicine | Admitting: Emergency Medicine

## 2017-08-11 ENCOUNTER — Encounter (HOSPITAL_BASED_OUTPATIENT_CLINIC_OR_DEPARTMENT_OTHER): Payer: Self-pay | Admitting: Emergency Medicine

## 2017-08-11 ENCOUNTER — Emergency Department (HOSPITAL_BASED_OUTPATIENT_CLINIC_OR_DEPARTMENT_OTHER): Payer: Managed Care, Other (non HMO)

## 2017-08-11 DIAGNOSIS — K859 Acute pancreatitis without necrosis or infection, unspecified: Secondary | ICD-10-CM | POA: Diagnosis not present

## 2017-08-11 DIAGNOSIS — F17210 Nicotine dependence, cigarettes, uncomplicated: Secondary | ICD-10-CM | POA: Insufficient documentation

## 2017-08-11 DIAGNOSIS — R1011 Right upper quadrant pain: Secondary | ICD-10-CM | POA: Insufficient documentation

## 2017-08-11 DIAGNOSIS — K760 Fatty (change of) liver, not elsewhere classified: Secondary | ICD-10-CM | POA: Insufficient documentation

## 2017-08-11 DIAGNOSIS — Z794 Long term (current) use of insulin: Secondary | ICD-10-CM

## 2017-08-11 DIAGNOSIS — E119 Type 2 diabetes mellitus without complications: Secondary | ICD-10-CM

## 2017-08-11 DIAGNOSIS — K852 Alcohol induced acute pancreatitis without necrosis or infection: Secondary | ICD-10-CM | POA: Diagnosis not present

## 2017-08-11 DIAGNOSIS — N183 Chronic kidney disease, stage 3 (moderate): Secondary | ICD-10-CM

## 2017-08-11 DIAGNOSIS — I129 Hypertensive chronic kidney disease with stage 1 through stage 4 chronic kidney disease, or unspecified chronic kidney disease: Secondary | ICD-10-CM

## 2017-08-11 DIAGNOSIS — Z79899 Other long term (current) drug therapy: Secondary | ICD-10-CM | POA: Insufficient documentation

## 2017-08-11 LAB — CBC WITH DIFFERENTIAL/PLATELET
BASOS ABS: 0 10*3/uL (ref 0.0–0.1)
BASOS PCT: 0 %
EOS ABS: 0.5 10*3/uL (ref 0.0–0.7)
EOS PCT: 6 %
HCT: 38.6 % (ref 36.0–46.0)
Hemoglobin: 13 g/dL (ref 12.0–15.0)
Lymphocytes Relative: 39 %
Lymphs Abs: 3.3 10*3/uL (ref 0.7–4.0)
MCH: 28.7 pg (ref 26.0–34.0)
MCHC: 33.7 g/dL (ref 30.0–36.0)
MCV: 85.2 fL (ref 78.0–100.0)
MONO ABS: 0.6 10*3/uL (ref 0.1–1.0)
Monocytes Relative: 7 %
Neutro Abs: 4 10*3/uL (ref 1.7–7.7)
Neutrophils Relative %: 48 %
PLATELETS: 227 10*3/uL (ref 150–400)
RBC: 4.53 MIL/uL (ref 3.87–5.11)
RDW: 14.5 % (ref 11.5–15.5)
WBC: 8.3 10*3/uL (ref 4.0–10.5)

## 2017-08-11 LAB — COMPREHENSIVE METABOLIC PANEL
ALT: 10 U/L (ref 0–44)
AST: 16 U/L (ref 15–41)
Albumin: 3.6 g/dL (ref 3.5–5.0)
Alkaline Phosphatase: 78 U/L (ref 38–126)
Anion gap: 9 (ref 5–15)
BUN: 16 mg/dL (ref 6–20)
CHLORIDE: 104 mmol/L (ref 98–111)
CO2: 25 mmol/L (ref 22–32)
CREATININE: 1.17 mg/dL — AB (ref 0.44–1.00)
Calcium: 9.1 mg/dL (ref 8.9–10.3)
GFR calc Af Amer: >60 mL/min (ref >60)
GFR calc non Af Amer: 54 mL/min — ABNORMAL LOW (ref >60)
Glucose, Bld: 170 mg/dL — ABNORMAL HIGH (ref 70–99)
POTASSIUM: 3.9 mmol/L (ref 3.5–5.1)
SODIUM: 138 mmol/L (ref 135–145)
Total Bilirubin: 0.3 mg/dL (ref 0.3–1.2)
Total Protein: 7 g/dL (ref 6.5–8.1)

## 2017-08-11 LAB — URINALYSIS, ROUTINE W REFLEX MICROSCOPIC
Bilirubin Urine: NEGATIVE
Glucose, UA: NEGATIVE mg/dL
HGB URINE DIPSTICK: NEGATIVE
KETONES UR: NEGATIVE mg/dL
Leukocytes, UA: NEGATIVE
Nitrite: NEGATIVE
Protein, ur: NEGATIVE mg/dL
Specific Gravity, Urine: 1.01 (ref 1.005–1.030)
pH: 6.5 (ref 5.0–8.0)

## 2017-08-11 LAB — TROPONIN I

## 2017-08-11 LAB — LIPASE, BLOOD: Lipase: 63 U/L — ABNORMAL HIGH (ref 11–51)

## 2017-08-11 MED ORDER — MORPHINE SULFATE (PF) 4 MG/ML IV SOLN
4.0000 mg | Freq: Once | INTRAVENOUS | Status: AC
  Administered 2017-08-11: 4 mg via INTRAMUSCULAR
  Filled 2017-08-11: qty 1

## 2017-08-11 MED ORDER — IOPAMIDOL (ISOVUE-300) INJECTION 61%
100.0000 mL | Freq: Once | INTRAVENOUS | Status: AC | PRN
  Administered 2017-08-11: 100 mL via INTRAVENOUS

## 2017-08-11 MED ORDER — SODIUM CHLORIDE 0.9 % IV BOLUS
1000.0000 mL | Freq: Once | INTRAVENOUS | Status: AC
  Administered 2017-08-11: 1000 mL via INTRAVENOUS

## 2017-08-11 MED ORDER — HYDROCODONE-ACETAMINOPHEN 5-325 MG PO TABS: 1.0000 | ORAL_TABLET | Freq: Four times a day (QID) | ORAL | 0 refills | Status: DC | PRN

## 2017-08-11 MED ORDER — DICYCLOMINE HCL 10 MG PO CAPS
20.0000 mg | ORAL_CAPSULE | Freq: Once | ORAL | Status: DC
  Filled 2017-08-11: qty 2

## 2017-08-11 MED ORDER — PANTOPRAZOLE SODIUM 20 MG PO TBEC: 20.0000 mg | DELAYED_RELEASE_TABLET | Freq: Every day | ORAL | 0 refills | Status: DC

## 2017-08-11 MED ORDER — METHOCARBAMOL 500 MG PO TABS
500.0000 mg | ORAL_TABLET | Freq: Once | ORAL | Status: AC
  Administered 2017-08-11: 500 mg via ORAL
  Filled 2017-08-11: qty 1

## 2017-08-11 MED ORDER — MORPHINE SULFATE (PF) 4 MG/ML IV SOLN
4.0000 mg | Freq: Once | INTRAVENOUS | Status: AC
  Administered 2017-08-11: 4 mg via INTRAVENOUS
  Filled 2017-08-11: qty 1

## 2017-08-11 MED ORDER — SUCRALFATE 1 G PO TABS: 1.0000 g | ORAL_TABLET | Freq: Three times a day (TID) | ORAL | 0 refills | Status: DC

## 2017-08-11 MED ORDER — ONDANSETRON HCL 4 MG/2ML IJ SOLN
INTRAMUSCULAR | Status: AC
  Administered 2017-08-11: 4 mg via INTRAVENOUS
  Filled 2017-08-11: qty 2

## 2017-08-11 MED ORDER — KETOROLAC TROMETHAMINE 30 MG/ML IJ SOLN
15.0000 mg | Freq: Once | INTRAMUSCULAR | Status: AC
  Administered 2017-08-11: 15 mg via INTRAVENOUS
  Filled 2017-08-11: qty 1

## 2017-08-11 MED ORDER — DICYCLOMINE HCL 20 MG PO TABS: 20.0000 mg | ORAL_TABLET | Freq: Two times a day (BID) | ORAL | 0 refills | Status: DC

## 2017-08-11 MED ORDER — METHOCARBAMOL 500 MG PO TABS: 500.0000 mg | ORAL_TABLET | Freq: Two times a day (BID) | ORAL | 0 refills | Status: DC

## 2017-08-11 MED ORDER — ONDANSETRON HCL 4 MG/2ML IJ SOLN
4.0000 mg | Freq: Once | INTRAMUSCULAR | Status: AC
  Administered 2017-08-11: 4 mg via INTRAVENOUS

## 2017-08-11 MED ORDER — MORPHINE SULFATE (PF) 4 MG/ML IV SOLN: 4.0000 mg | Freq: Once | INTRAVENOUS | Status: DC

## 2017-08-11 MED FILL — METHOCARBAMOL 500 MG TABLET: 500 | 7 days supply | Qty: 14 | Fill #0

## 2017-08-11 MED FILL — HYDROCODON-APAP 5-325: 5-325 | 2 days supply | Qty: 8 | Fill #0

## 2017-08-11 NOTE — ED Provider Notes (Signed)
[facility] EMERGENCY DEPARTMENT Provider Note   CSN: [id number] Arrival date & time: 08/11/17  9983     History   Chief Complaint Chief Complaint  Patient presents with  . Flank Pain  . Abdominal Pain    HPI Renee James is a 47 y.o. female.  HPI  Patient is a 47 year old female with a history of type 2 diabetes mellitus, hyper lipidemia, hypertension, CKD, presenting for right upper quadrant epigastric pain.  Patient reports that her symptoms began 4 days ago and progressively worsened throughout the week.  Patient reports that since last night, her symptoms been constant, and any movement in bed will exacerbate her pain.  Patient scrubs the pain is sharp and radiating to the back.  She reports feeling a "knot" in the right upper quadrant of her abdomen.  Patient denies any nausea, vomiting, or diarrhea, or melena or hematochezia.  Patient does report that she has been constipated throughout this week.  No dysuria, urgency, or frequency.  No fever or chills.  No chest pain or shortness of breath.  Patient denies history of prior similar pain.  Patient denies that eating will make the pain worse or better.  Patient denies any history of gallstones, peptic ulcer disease, or nephrolithiasis.  No remedies tried at home for symptoms.  Past Medical History:  Diagnosis Date  . Allergy   . Anxiety   . Arthritis   . Asthma   . Bell's palsy   . Bell's palsy 2012  . Chronic kidney disease (CKD), stage III (moderate) (HCC)   . Depression   . Diabetes mellitus without complication ([place])   . Diabetic neuropathy ([place])   . Headache    migraines  . Hyperlipidemia   . Hypertension   . Pneumonia   . Renal insufficiency 05/13/2015    Patient Active Problem List   Diagnosis Date Noted  . 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 Post-operative state 05/09/2017  . Right ovarian cyst 03/07/2017  . Fibroids 03/07/2017  . Abnormal uterine bleeding (AUB) 02/22/2017  . CKD  (chronic kidney disease) stage 3, GFR 30-59 ml/min (HCC) 05/16/2016  . Renal insufficiency 05/13/2015  . Ovarian torsion 04/28/2015  . Pain   . Hyperlipidemia 03/19/2015  . Obesity, unspecified 03/19/2015  . Controlled type 2 diabetes mellitus without complication, without long-term current use of insulin ([place]) 03/19/2015  . Essential hypertension 03/19/2015  . GAD (generalized anxiety disorder) 03/19/2015    Past Surgical History:  Procedure Laterality Date  . ABDOMINAL HYSTERECTOMY  05/09/2017  . ECTOPIC PREGNANCY SURGERY    . LAPAROSCOPY N/A 04/28/2015   Procedure: LAPAROSCOPY DIAGNOSTIC;  Surgeon: Donnamae Jude, MD;  Location: [facility];  Service: Gynecology;  Laterality: N/A;  . OVARIAN CYST REMOVAL    . ROBOTIC ASSISTED LAPAROSCOPIC HYSTERECTOMY AND SALPINGECTOMY Bilateral 05/09/2017   Procedure: XI ROBOTIC ASSISTED LAPAROSCOPIC HYSTERECTOMY AND Left SALPINGECTOMY, Lysis of Adhesions, cystoscopy;  Surgeon: Lavonia Drafts, MD;  Location: [facility];  Service: Gynecology;  Laterality: Bilateral;  . TUBAL LIGATION       OB History    Gravida  5   Para      Term      Preterm      AB  2   Living  3     SAB      TAB  1   Ectopic  1   Multiple      Live Births  Home Medications    Prior to Admission medications   Medication Sig Start Date End Date Taking? Authorizing Provider  albuterol (PROVENTIL HFA;VENTOLIN HFA) 108 (90 Base) MCG/ACT inhaler Inhale 1-2 puffs into the lungs every 6 (six) hours as needed for wheezing or shortness of breath. 08/29/16   Copland, Gay Filler, MD  ALPRAZolam (XANAX) 0.25 MG tablet Take 1 tablet (0.25 mg total) by mouth at bedtime as needed for anxiety or sleep. 06/14/17   Copland, Gay Filler, MD  citalopram (CELEXA) 20 MG tablet Take 1 tablet (20 mg total) by mouth daily. 06/14/17   Copland, Gay Filler, MD  docusate sodium (COLACE) 100 MG capsule Take 1 capsule (100 mg total) by mouth 2 (two) times daily. 05/10/17    Lavonia Drafts, MD  ergocalciferol (VITAMIN D2) 50000 units capsule Take 50,000 Units by mouth every Monday.    [provider]  Insulin Detemir (LEVEMIR) 100 UNIT/ML Pen Inject 10 Units into the skin daily at 10 pm. Check your morning blood sugar every day. If morning blood sugars aren't at goal (100), then increase the amount of evening levemir insulin by 1 unit every night until fasting blood sugars  goal of less than 100 is reached. Patient taking differently: Inject 25 Units into the skin 2 (two) times daily. Check your morning blood sugar every day. If morning blood sugars aren't at goal (100), then increase the amount of evening levemir insulin by 1 unit every night until fasting blood sugars  goal of less than 100 is reached. 03/07/16   Saguier, Percell Miller, PA-C  Lancets [facility] ULTRASOFT) lancets USE AS DIRECTED TO TEST BLOOD SUGARS DAILY DX: E11.9 03/07/16   Saguier, Percell Miller, PA-C  losartan (COZAAR) 100 MG tablet TAKE 1 TABLET BY MOUTH ONCE DAILY 05/17/17   Copland, Gay Filler, MD  metFORMIN (GLUCOPHAGE-XR) 500 MG 24 hr tablet Take 4 tablets (2,000 mg total) by mouth daily. 07/15/16   Copland, Gay Filler, MD  pioglitazone (ACTOS) 30 MG tablet Take 30 mg by mouth daily.    [provider]  pravastatin (PRAVACHOL) 20 MG tablet Take 20 mg by mouth daily.     [provider]  pregabalin (LYRICA) 75 MG capsule Take 75 mg by mouth daily as needed (for pain).     [provider]  torsemide (DEMADEX) 20 MG tablet Take 20 mg by mouth daily.     [provider]    Family History Family History  Problem Relation Age of Onset  . Diabetes Mother   . Heart disease Mother   . Kidney failure Mother   . Stroke Father   . Kidney failure Father   . Breast cancer Unknown     Social History Social History   Tobacco Use  . Smoking status: Current Every Day Smoker    Packs/day: 0.50    Years: 23.00    Pack years: 11.50    Types: Cigarettes  . Smokeless  tobacco: Never Used  Substance Use Topics  . Alcohol use: Yes    Comment: occ  . Drug use: No     Allergies   Latex; Talc; Ace inhibitors; and Other   Review of Systems Review of Systems  Constitutional: Negative for chills and fever.  HENT: Negative for congestion, rhinorrhea, sinus pain and sore throat.   Eyes: Negative for visual disturbance.  Respiratory: Negative for cough, chest tightness and shortness of breath.   Cardiovascular: Negative for chest pain, palpitations and leg swelling.  Gastrointestinal: Positive for abdominal distention, abdominal  pain and constipation. Negative for diarrhea, nausea and vomiting.  Genitourinary: Negative for dysuria and flank pain.  Musculoskeletal: Negative for back pain and myalgias.  Skin: Negative for rash.  Neurological: Negative for dizziness, syncope, light-headedness and headaches.     Physical Exam Updated Vital Signs BP 132/79 (BP Location: Left Arm)   Pulse (!) 56   Temp 98.7 F (37.1 C) (Oral)   Resp 18   Ht 5\' 3"  (1.6 m)   Wt 88.5 kg   LMP 05/09/2017   SpO2 100%   BMI 34.54 kg/m   Physical Exam  Constitutional: She appears well-developed and well-nourished. No distress.  HENT:  Head: Normocephalic and atraumatic.  Mouth/Throat: Oropharynx is clear and moist.  Eyes: Pupils are equal, round, and reactive to light. Conjunctivae and EOM are normal.  Neck: Normal range of motion. Neck supple.  Cardiovascular: Normal rate, regular rhythm, S1 normal and S2 normal.  No murmur heard. Pulmonary/Chest: Effort normal and breath sounds normal. She has no wheezes. She has no rales.  Abdominal: Soft. Bowel sounds are normal. She exhibits no distension. There is tenderness in the right upper quadrant and epigastric area. There is no rebound and no guarding.  Musculoskeletal: Normal range of motion. She exhibits no edema or deformity.  Lymphadenopathy:    She has no cervical adenopathy.  Neurological: She is alert.  Cranial  nerves grossly intact. Patient moves extremities symmetrically and with good coordination.  Skin: Skin is warm and dry. No rash noted. No erythema.  Psychiatric: She has a normal mood and affect. Her behavior is normal. Judgment and thought content normal.  Nursing note and vitals reviewed.    ED Treatments / Results  Labs (all labs ordered are listed, but only abnormal results are displayed) Labs Reviewed  COMPREHENSIVE METABOLIC PANEL - Abnormal; Notable for the following components:      Result Value   Glucose, Bld 170 (*)    Creatinine, Ser 1.17 (*)    GFR calc non Af Amer 54 (*)    All other components within normal limits  LIPASE, BLOOD - Abnormal; Notable for the following components:   Lipase 63 (*)    All other components within normal limits  URINALYSIS, ROUTINE W REFLEX MICROSCOPIC  CBC WITH DIFFERENTIAL/PLATELET  TROPONIN I    EKG EKG Interpretation  Date/Time:  Friday August 11 2017 11:14:06 EDT Ventricular Rate:  51 PR Interval:    QRS Duration: 105 QT Interval:  429 QTC Calculation: 396 R Axis:   68 Text Interpretation:  Sinus rhythm Low voltage, precordial leads Confirmed by Quintella Reichert [phone]) on 08/11/2017 11:18:20 AM   Radiology No results found.  Procedures Procedures (including critical care time)  Medications Ordered in ED Medications  morphine 4 MG/ML injection 4 mg (4 mg Intramuscular Given 08/11/17 1111)     Initial Impression / Assessment and Plan / ED Course  I have reviewed the triage vital signs and the nursing notes.  Pertinent labs & imaging results that were available during my care of the patient were reviewed by me and considered in my medical decision making (see chart for details).  Clinical Course as of Aug 11 1699  Fri Aug 11, 2017  1056 Not meeting criteria for acute pancreatitis.  Lipase(!): 63 [AM]  1311 Reassessed patient.  Patient continues to have pain, however it is less after antiemetics and analgesia.  Right  upper quadrant ultrasound negative for cholecystitis or cholelithiasis.  Will assess for appendicitis and CT scan.   [AM]  1550 Discussed results with patient.  Patient visibly upset that we do not have a cause of her pain.  I went over the testing that we performed today including cardiac, and provided reassurance for her symptoms.  I encouraged her to have GI follow-up, and provided referral for gastroenterology for evaluation and possible HIDA scan.   [AM]  1700 I have reviewed the patient's information in the [facility] for the past 12 months and found them to have no Rx that are overlapping.  Opiates were prescribed for an acute, painful condition. The patient was given information on side effects and encouraged to use other, non-opiate pain medication primary, only using opiate medicine sparingly for severe pain.   [AM]    Clinical Course User Index [AM] Albesa Seen, PA-C    Patient nontoxic-appearing, afebrile, and abdomen is nonsurgical.  Differential diagnosis includes cholecystitis, cholelithiasis, nephrolithiasis, peptic ulcer disease, gastritis, appendicitis, pyelonephritis.  Work-up is reassuring.  Patient with no acute metabolic appearance is concerning for systemic illness.  Patient's creatinine is 1.17, consistent with CKD, and GFR is 54.  Patient CT abdomen and pelvis shows moderate stool burden, normal appendix, no evidence of bowel inflammation.  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  Discussed with patient keeping her out of this position for a week, and following up with her primary care provider next week, and using short course of analgesic medication and Robaxin.  Patient instructed not to drive, drink alcohol, or operate machinery while taking medication that can impair judgment.  I  discussed with the patient that given that this is not related to eating, unlikely to be solely related to gallbladder, but she may need further GI work-up if not improving.  Return precautions were given for worsening abdominal pain, intractable nausea or vomiting, or fever chills or pain.  Patient is in understanding and agrees with the plan of care.  Final Clinical Impressions(s) / ED Diagnoses   Final diagnoses:  RUQ pain  Hepatic steatosis      Tamala Julian 08/11/17 1704    Quintella Reichert, MD 08/12/17 [phone]

## 2017-08-11 NOTE — ED Notes (Addendum)
ED Provider at bedside - Dr. Maryan Rued.  Discussing test results in detail.

## 2017-08-11 NOTE — ED Notes (Signed)
Pt walked out of ED without paperwork or D/c vitals.

## 2017-08-11 NOTE — ED Notes (Signed)
Advised patient that PA would be in the room shortly to discuss results

## 2017-08-11 NOTE — ED Triage Notes (Addendum)
RUQ pain since Monday and today pain radiates to flank. No n/v , last BM last night but was a "little one" was constipated for 3 days . Had a MRI done on Monday for recurrent back pain

## 2017-08-11 NOTE — ED Notes (Signed)
IV attempt x 2 by this RN. 

## 2017-08-11 NOTE — ED Notes (Signed)
Pt to CT at this time.

## 2017-08-11 NOTE — ED Notes (Signed)
ED Provider at bedside. 

## 2017-08-11 NOTE — Discharge Instructions (Addendum)
Please see the information and instructions below regarding your visit.  Your diagnoses today include:  1. RUQ pain   2. Hepatic steatosis     Your exam and testing today is reassuring that there is not a condition causing your abdominal pain that we immediately need to intervene on at this time with surgery.  This appears to be coming from within the abdominal wall. We will treat you will you with muscle relaxants.   Abdominal (belly) pain can be caused by many things. Your caregiver performed an examination and possibly ordered blood/urine tests and imaging (CT scan, x-rays, ultrasound). Many cases can be observed and treated at home after initial evaluation in the emergency department. Even though you are being discharged home, abdominal pain can be unpredictable. Therefore, you need a repeated exam if your pain does not resolve, returns, or worsens. Most patients with abdominal pain don't have to be admitted to the hospital or have surgery, but serious problems like appendicitis and gallbladder attacks can start out as nonspecific pain. Many abdominal conditions cannot be diagnosed in one visit, so follow-up evaluations are very important.  Tests performed today include: Blood counts and electrolytes Blood tests to check liver and kidney function Blood tests to check pancreas function Urine test to look for infection and pregnancy (in women) Vital signs. See below for your results today.   See side panel of your discharge paperwork for testing performed today. Vital signs are listed at the bottom of these instructions.   Medications prescribed:    Take any prescribed medications only as prescribed, and any over the counter medications only as directed on the packaging.  You are prescribed Robaxin, a muscle relaxant. Some common side effects of this medication include:  Feeling sleepy.  Dizziness. Take care upon going from a seated to a standing position.  Dry mouth.  Feeling tired or  weak.  Hard stools (constipation).  Upset stomach. These are not all of the side effects that may occur. If you have questions about side effects, call your doctor. Call your primary care provider for medical advice about side effects.  This medication can be sedating. Only take this medication as needed. Please do not combine with alcohol. Do not drive or operate machinery while taking this medication.   This medication can interact with some other medications. Make sure to tell any provider you are taking this medication before they prescribe you a new medication.    Home care instructions:  Tr y eating, but start with foods that have a lot of fluid in them. Good examples are soup, Jell-O, and popsicles. If you do OK with those foods, you can try soft, bland foods, such as plain yogurt. Foods that are high in carbohydrates ("carbs"), like bread or saltine crackers, can help settle your stomach. Some people also find that ginger helps with nausea. You should avoid foods that have a lot of fat in them. They can make nausea worse. Call your doctor if your symptoms come back when you try to eat.  Please follow any educational materials contained in this packet.   Follow-up instructions: Please follow-up with your primary care provider in as soon as possible for further evaluation of your symptoms if they are not completely improved.   Please follow up with Dr. Benson [place] of gastroenterology for further evaluation.  Return instructions:  Please return to the Emergency Department if you experience worsening symptoms.  SEEK IMMEDIATE MEDICAL ATTENTION IF: The pain does not go away or becomes  severe  A temperature above 101F develops  Repeated vomiting occurs (multiple episodes)  The pain becomes localized to portions of the abdomen. The right side could possibly be appendicitis. In an adult, the left lower portion of the abdomen could be colitis or diverticulitis.  Blood is being passed in stools or  vomit (bright red or black tarry stools)  You develop chest pain, difficulty breathing, dizziness or fainting, or become confused, poorly responsive, or inconsolable (young children) If you have any other emergent concerns regarding your health  Additional Information:   Your vital signs today were: BP 136/82    Pulse (!) 52    Temp 98.7 F (37.1 C) (Oral)    Resp 12    Ht 5\' 3"  (1.6 m)    Wt 88.5 kg    LMP 05/09/2017    SpO2 100%    BMI 34.54 kg/m  If your blood pressure (BP) was elevated on multiple readings during this visit above 130 for the top number or above 80 for the bottom number, please have this repeated by your primary care provider within one month. --------------  Thank you for allowing [place] to participate in your care today.

## 2017-08-13 ENCOUNTER — Inpatient Hospital Stay (HOSPITAL_COMMUNITY)
Admission: EM | Admit: 2017-08-13 | Discharge: 2017-08-16 | DRG: 439 | Disposition: A | Discharge status: Home / Self Care | Payer: Managed Care, Other (non HMO) | Attending: Family Medicine | Admitting: Family Medicine

## 2017-08-13 ENCOUNTER — Emergency Department (HOSPITAL_COMMUNITY): Payer: Managed Care, Other (non HMO)

## 2017-08-13 ENCOUNTER — Encounter (HOSPITAL_COMMUNITY): Payer: Self-pay | Admitting: *Deleted

## 2017-08-13 ENCOUNTER — Other Ambulatory Visit: Payer: Self-pay

## 2017-08-13 DIAGNOSIS — J449 Chronic obstructive pulmonary disease, unspecified: Secondary | ICD-10-CM | POA: Diagnosis present

## 2017-08-13 DIAGNOSIS — E1122 Type 2 diabetes mellitus with diabetic chronic kidney disease: Secondary | ICD-10-CM | POA: Diagnosis present

## 2017-08-13 DIAGNOSIS — K859 Acute pancreatitis without necrosis or infection, unspecified: Secondary | ICD-10-CM | POA: Diagnosis not present

## 2017-08-13 DIAGNOSIS — Z888 Allergy status to other drugs, medicaments and biological substances status: Secondary | ICD-10-CM

## 2017-08-13 DIAGNOSIS — F411 Generalized anxiety disorder: Secondary | ICD-10-CM | POA: Diagnosis present

## 2017-08-13 DIAGNOSIS — F329 Major depressive disorder, single episode, unspecified: Secondary | ICD-10-CM | POA: Diagnosis present

## 2017-08-13 DIAGNOSIS — N183 Chronic kidney disease, stage 3 unspecified: Secondary | ICD-10-CM | POA: Diagnosis present

## 2017-08-13 DIAGNOSIS — F17210 Nicotine dependence, cigarettes, uncomplicated: Secondary | ICD-10-CM | POA: Diagnosis present

## 2017-08-13 DIAGNOSIS — T402X5A Adverse effect of other opioids, initial encounter: Secondary | ICD-10-CM | POA: Diagnosis present

## 2017-08-13 DIAGNOSIS — K852 Alcohol induced acute pancreatitis without necrosis or infection: Secondary | ICD-10-CM | POA: Diagnosis present

## 2017-08-13 DIAGNOSIS — Z794 Long term (current) use of insulin: Secondary | ICD-10-CM | POA: Diagnosis not present

## 2017-08-13 DIAGNOSIS — F32A Depression, unspecified: Secondary | ICD-10-CM | POA: Diagnosis present

## 2017-08-13 DIAGNOSIS — N179 Acute kidney failure, unspecified: Secondary | ICD-10-CM | POA: Diagnosis present

## 2017-08-13 DIAGNOSIS — Z91040 Latex allergy status: Secondary | ICD-10-CM

## 2017-08-13 DIAGNOSIS — E785 Hyperlipidemia, unspecified: Secondary | ICD-10-CM | POA: Diagnosis present

## 2017-08-13 DIAGNOSIS — Z79899 Other long term (current) drug therapy: Secondary | ICD-10-CM | POA: Diagnosis not present

## 2017-08-13 DIAGNOSIS — K5903 Drug induced constipation: Secondary | ICD-10-CM | POA: Diagnosis present

## 2017-08-13 DIAGNOSIS — F1010 Alcohol abuse, uncomplicated: Secondary | ICD-10-CM | POA: Diagnosis present

## 2017-08-13 DIAGNOSIS — Z7951 Long term (current) use of inhaled steroids: Secondary | ICD-10-CM

## 2017-08-13 DIAGNOSIS — E876 Hypokalemia: Secondary | ICD-10-CM | POA: Diagnosis present

## 2017-08-13 DIAGNOSIS — I10 Essential (primary) hypertension: Secondary | ICD-10-CM | POA: Diagnosis not present

## 2017-08-13 DIAGNOSIS — E1140 Type 2 diabetes mellitus with diabetic neuropathy, unspecified: Secondary | ICD-10-CM | POA: Diagnosis present

## 2017-08-13 DIAGNOSIS — J45909 Unspecified asthma, uncomplicated: Secondary | ICD-10-CM | POA: Diagnosis present

## 2017-08-13 DIAGNOSIS — I129 Hypertensive chronic kidney disease with stage 1 through stage 4 chronic kidney disease, or unspecified chronic kidney disease: Secondary | ICD-10-CM | POA: Diagnosis present

## 2017-08-13 DIAGNOSIS — Z720 Tobacco use: Secondary | ICD-10-CM | POA: Diagnosis present

## 2017-08-13 DIAGNOSIS — E119 Type 2 diabetes mellitus without complications: Secondary | ICD-10-CM

## 2017-08-13 LAB — CBC
HEMATOCRIT: 38.2 % (ref 36.0–46.0)
Hemoglobin: 12.8 g/dL (ref 12.0–15.0)
MCH: 28.9 pg (ref 26.0–34.0)
MCHC: 33.5 g/dL (ref 30.0–36.0)
MCV: 86.2 fL (ref 78.0–100.0)
Platelets: 385 10*3/uL (ref 150–400)
RBC: 4.43 MIL/uL (ref 3.87–5.11)
RDW: 14.2 % (ref 11.5–15.5)
WBC: 14.7 10*3/uL — AB (ref 4.0–10.5)

## 2017-08-13 LAB — MAGNESIUM: Magnesium: 1.9 mg/dL (ref 1.7–2.4)

## 2017-08-13 LAB — COMPREHENSIVE METABOLIC PANEL
ALBUMIN: 3.4 g/dL — AB (ref 3.5–5.0)
ALT: 11 U/L (ref 0–44)
AST: 21 U/L (ref 15–41)
Alkaline Phosphatase: 76 U/L (ref 38–126)
Anion gap: 10 (ref 5–15)
BILIRUBIN TOTAL: 0.9 mg/dL (ref 0.3–1.2)
BUN: 17 mg/dL (ref 6–20)
CHLORIDE: 106 mmol/L (ref 98–111)
CO2: 24 mmol/L (ref 22–32)
Calcium: 9 mg/dL (ref 8.9–10.3)
Creatinine, Ser: 1.4 mg/dL — ABNORMAL HIGH (ref 0.44–1.00)
GFR calc Af Amer: 50 mL/min — ABNORMAL LOW (ref >60)
GFR calc non Af Amer: 43 mL/min — ABNORMAL LOW (ref >60)
GLUCOSE: 181 mg/dL — AB (ref 70–99)
POTASSIUM: 4.8 mmol/L (ref 3.5–5.1)
Sodium: 140 mmol/L (ref 135–145)
TOTAL PROTEIN: 6.8 g/dL (ref 6.5–8.1)

## 2017-08-13 LAB — URINALYSIS, ROUTINE W REFLEX MICROSCOPIC
Bilirubin Urine: NEGATIVE
Glucose, UA: NEGATIVE mg/dL
Hgb urine dipstick: NEGATIVE
Ketones, ur: NEGATIVE mg/dL
LEUKOCYTES UA: NEGATIVE
NITRITE: NEGATIVE
PH: 6 (ref 5.0–8.0)
Protein, ur: NEGATIVE mg/dL
Specific Gravity, Urine: 1.019 (ref 1.005–1.030)

## 2017-08-13 LAB — LIPASE, BLOOD: LIPASE: 5311 U/L — AB (ref 11–51)

## 2017-08-13 LAB — GLUCOSE, CAPILLARY
Glucose-Capillary: 104 mg/dL — ABNORMAL HIGH (ref 70–99)
Glucose-Capillary: 83 mg/dL (ref 70–99)

## 2017-08-13 LAB — PHOSPHORUS: PHOSPHORUS: 2.4 mg/dL — AB (ref 2.5–4.6)

## 2017-08-13 MED ORDER — ONDANSETRON HCL 4 MG/2ML IJ SOLN
4.0000 mg | Freq: Once | INTRAMUSCULAR | Status: AC
  Administered 2017-08-13: 4 mg via INTRAVENOUS
  Filled 2017-08-13: qty 2

## 2017-08-13 MED ORDER — ADULT MULTIVITAMIN W/MINERALS CH
1.0000 | ORAL_TABLET | Freq: Every day | ORAL | Status: DC
  Administered 2017-08-13 – 2017-08-16 (×4): 1 via ORAL
  Filled 2017-08-13 (×4): qty 1

## 2017-08-13 MED ORDER — FAMOTIDINE IN NACL 20-0.9 MG/50ML-% IV SOLN
20.0000 mg | Freq: Two times a day (BID) | INTRAVENOUS | Status: DC
  Administered 2017-08-13 – 2017-08-15 (×5): 20 mg via INTRAVENOUS
  Filled 2017-08-13 (×5): qty 50

## 2017-08-13 MED ORDER — MORPHINE SULFATE (PF) 4 MG/ML IV SOLN
4.0000 mg | Freq: Once | INTRAVENOUS | Status: AC
  Administered 2017-08-13: 4 mg via INTRAVENOUS
  Filled 2017-08-13: qty 1

## 2017-08-13 MED ORDER — LORAZEPAM 1 MG PO TABS: 0.0000 mg | ORAL_TABLET | Freq: Four times a day (QID) | ORAL | Status: DC

## 2017-08-13 MED ORDER — LOSARTAN POTASSIUM 50 MG PO TABS
100.0000 mg | ORAL_TABLET | Freq: Every day | ORAL | Status: DC
  Administered 2017-08-13 – 2017-08-16 (×4): 100 mg via ORAL
  Filled 2017-08-13 (×4): qty 2

## 2017-08-13 MED ORDER — THIAMINE HCL 100 MG/ML IJ SOLN
100.0000 mg | Freq: Every day | INTRAMUSCULAR | Status: DC
  Filled 2017-08-13: qty 2

## 2017-08-13 MED ORDER — VITAMIN B-1 100 MG PO TABS
100.0000 mg | ORAL_TABLET | Freq: Every day | ORAL | Status: DC
  Administered 2017-08-13 – 2017-08-16 (×4): 100 mg via ORAL
  Filled 2017-08-13 (×4): qty 1

## 2017-08-13 MED ORDER — SODIUM CHLORIDE 0.9 % IV SOLN
INTRAVENOUS | Status: DC
  Administered 2017-08-13 (×2): via INTRAVENOUS

## 2017-08-13 MED ORDER — ONDANSETRON HCL 4 MG/2ML IJ SOLN
4.0000 mg | Freq: Four times a day (QID) | INTRAMUSCULAR | Status: DC | PRN
Start: 2017-08-13 — End: 2017-08-16

## 2017-08-13 MED ORDER — SODIUM CHLORIDE 0.9 % IV BOLUS
1000.0000 mL | Freq: Once | INTRAVENOUS | Status: AC
  Administered 2017-08-13: 1000 mL via INTRAVENOUS

## 2017-08-13 MED ORDER — INSULIN ASPART 100 UNIT/ML ~~LOC~~ SOLN
0.0000 [IU] | Freq: Three times a day (TID) | SUBCUTANEOUS | Status: DC
  Administered 2017-08-15: 2 [IU] via SUBCUTANEOUS
  Administered 2017-08-16 (×2): 1 [IU] via SUBCUTANEOUS

## 2017-08-13 MED ORDER — NICOTINE 21 MG/24HR TD PT24
21.0000 mg | MEDICATED_PATCH | Freq: Every day | TRANSDERMAL | Status: DC | PRN
  Administered 2017-08-15: 21 mg via TRANSDERMAL
  Filled 2017-08-13: qty 1

## 2017-08-13 MED ORDER — ONDANSETRON HCL 4 MG PO TABS: 4.0000 mg | ORAL_TABLET | Freq: Four times a day (QID) | ORAL | Status: DC | PRN

## 2017-08-13 MED ORDER — ONDANSETRON HCL 4 MG/2ML IJ SOLN: 4.0000 mg | Freq: Once | INTRAMUSCULAR | Status: DC | PRN

## 2017-08-13 MED ORDER — FENTANYL CITRATE (PF) 100 MCG/2ML IJ SOLN
50.0000 ug | INTRAMUSCULAR | Status: AC | PRN
  Administered 2017-08-13 – 2017-08-14 (×4): 50 ug via INTRAVENOUS
  Filled 2017-08-13 (×4): qty 2

## 2017-08-13 MED ORDER — LORAZEPAM 2 MG/ML IJ SOLN: 0.0000 mg | Freq: Two times a day (BID) | INTRAMUSCULAR | Status: DC

## 2017-08-13 MED ORDER — LORAZEPAM 2 MG/ML IJ SOLN
0.0000 mg | Freq: Four times a day (QID) | INTRAMUSCULAR | Status: DC
  Administered 2017-08-13 (×2): 1 mg via INTRAVENOUS
  Filled 2017-08-13 (×2): qty 1

## 2017-08-13 MED ORDER — FENTANYL CITRATE (PF) 100 MCG/2ML IJ SOLN
50.0000 ug | Freq: Once | INTRAMUSCULAR | Status: AC
  Administered 2017-08-13: 50 ug via INTRAVENOUS
  Filled 2017-08-13: qty 2

## 2017-08-13 MED ORDER — LORAZEPAM 1 MG PO TABS: 0.0000 mg | ORAL_TABLET | Freq: Two times a day (BID) | ORAL | Status: DC

## 2017-08-13 NOTE — H&P (Signed)
History and Physical    Renee James TKP:[id number] DOB: July 22, 1970 DOA: 08/13/2017  PCP: Darreld Mclean, MD   Patient coming from: Home.  I have personally briefly reviewed patient's old medical records in [place]  Chief Complaint: Abdominal pain.  HPI: Renee James is a 47 y.o. female with medical history significant of allergies, anxiety, depression, asthma/COPD, Bell's palsy, stage III chronic kidney disease, type 2 diabetes, diabetic neuropathy, migraine headaches, hyperlipidemia, hypertension, history of pneumonia who is coming to the emergency department for the second time in the past 3 days with complaints of progressively worse abdominal pain since Monday.  She was seen on Friday she denies fever, chills, nausea, emesis, diarrhea, constipation, melena or hematochezia.  She denies dysuria, frequency or hematuria.  Denies rhinorrhea, sore throat, dyspnea, hemoptysis, wheezing, chest pain, palpitations, diaphoresis, PND, orthopnea or pitting edema of the lower extremity.  No heat or cold intolerance.  Denies polyuria, polydipsia, polyphagia or blurred vision.  Denies skin rashes.  She has been anxious and feeling depressed after her son was was killed with a firearm in May.  She has been having an increase wine consumption, but states that she has not been drinking daily.  ED Course: Initial vital signs temperature 98.4 F, pulse 82, respirations 20, blood pressure 166/103 mmHg and O2 sat 99% on room air.  The patient was given a 1000 mL normal saline bolus, morphine sulfate 4 mg IVP x2, Zofran 4 mg IVP x1, fentanyl 50 mcg x 1 IVP and a as needed order old fentanyl 50 mcg every hour as needed x4.  Her urinalysis was normal.  White count was 14.7, hemoglobin 12.8 g/dL and platelets 385.  Sodium 140, potassium 4.8, chloride 106 and CO2 24 mmol/L.  BUN was 17, creatinine 1.40, glucose 181, calcium 9.0, magnesium 1.9 and phosphorus 2.4 mg/dL.  Her albumin was decreased at 3.4 g/dL, but  all other hepatic function tests were within normal limits.  Lipase level was 5311 units/L.  Imaging from 2 days ago with CT abdomen/pelvis without contrast did not show any acute findings.  However there was a benign-appearing calcification along the periphery of the right adrenal gland which is stable.  Previous trauma to the area suspected.  There was hepatic steatosis and aortic atherosclerosis.  RUQ ultrasound showed mild fatty infiltration of the liver.  Pancreatic duct at the upper limits of normal size.  Please see images and full radiology report for further detail.  Review of Systems: As per HPI otherwise 10 point review of systems negative.   Past Medical History:  Diagnosis Date  . Allergy   . Anxiety   . Arthritis   . Asthma   . Bell's palsy   . Bell's palsy 2012  . Chronic kidney disease (CKD), stage III (moderate) (HCC)   . Depression   . Diabetes mellitus without complication ([place])   . Diabetic neuropathy ([place])   . Headache    migraines  . Hyperlipidemia   . Hypertension   . Pneumonia   . Renal insufficiency 05/13/2015    Past Surgical History:  Procedure Laterality Date  . ABDOMINAL HYSTERECTOMY  05/09/2017  . ECTOPIC PREGNANCY SURGERY    . LAPAROSCOPY N/A 04/28/2015   Procedure: LAPAROSCOPY DIAGNOSTIC;  Surgeon: Donnamae Jude, MD;  Location: [facility];  Service: Gynecology;  Laterality: N/A;  . OVARIAN CYST REMOVAL    . ROBOTIC ASSISTED LAPAROSCOPIC HYSTERECTOMY AND SALPINGECTOMY Bilateral 05/09/2017   Procedure: XI ROBOTIC ASSISTED LAPAROSCOPIC HYSTERECTOMY AND Left SALPINGECTOMY, Lysis  of Adhesions, cystoscopy;  Surgeon: Lavonia Drafts, MD;  Location: [facility];  Service: Gynecology;  Laterality: Bilateral;  . TUBAL LIGATION       reports that she has been smoking cigarettes. She has a 11.50 pack-year smoking history. She has never used smokeless tobacco. She reports that she drinks alcohol. She reports that she does not use drugs.  Allergies  Allergen  Reactions  . Latex Hives, Shortness Of Breath and Itching  . Talc Hives, Shortness Of Breath and Itching  . Ace Inhibitors Cough  . 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 Diabetes Mother   . Heart disease Mother   . Kidney failure Mother   . Stroke Father   . Kidney failure Father   . Breast cancer Unknown     Prior to Admission medications   Medication Sig Start Date End Date Taking? Authorizing Provider  acetaminophen (TYLENOL) 500 MG tablet Take 1,000-1,500 mg by mouth every 6 (six) hours as needed for moderate pain.   Yes [provider]  ALPRAZolam (XANAX) 0.25 MG tablet Take 1 tablet (0.25 mg total) by mouth at bedtime as needed for anxiety or sleep. 06/14/17  Yes Copland, Gay Filler, MD  HYDROcodone-acetaminophen (NORCO/VICODIN) 5-325 MG tablet Take 1 tablet by mouth every 6 (six) hours as needed. Patient taking differently: Take 1 tablet by mouth every 6 (six) hours as needed for moderate pain.  08/11/17  Yes Langston Masker B, PA-C  Insulin Detemir (LEVEMIR) 100 UNIT/ML Pen Inject 10 Units into the skin daily at 10 pm. Check your morning blood sugar every day. If morning blood sugars aren't at goal (100), then increase the amount of evening levemir insulin by 1 unit every night until fasting blood sugars  goal of less than 100 is reached. Patient taking differently: Inject 25 Units into the skin 2 (two) times daily. Check your morning blood sugar every day. If morning blood sugars aren't at goal (100), then increase the amount of evening levemir insulin by 1 unit every night until fasting blood sugars  goal of less than 100 is reached. 03/07/16  Yes Saguier, Percell Miller, PA-C  losartan (COZAAR) 100 MG tablet TAKE 1 TABLET BY MOUTH ONCE DAILY Patient taking differently: Take 100 mg by mouth daily.  05/17/17  Yes Copland, Gay Filler, MD  metFORMIN (GLUCOPHAGE-XR) 500 MG  24 hr tablet Take 4 tablets (2,000 mg total) by mouth daily. 07/15/16  Yes Copland, Gay Filler, MD  methocarbamol (ROBAXIN) 500 MG tablet Take 1 tablet (500 mg total) by mouth 2 (two) times daily. 08/11/17  Yes Valere Dross, Alyssa B, PA-C  pioglitazone (ACTOS) 30 MG tablet Take 30 mg by mouth daily.   Yes [provider]  pravastatin (PRAVACHOL) 20 MG tablet Take 20 mg by mouth daily.    Yes [provider]  pregabalin (LYRICA) 75 MG capsule Take 75 mg by mouth daily as needed (for pain).    Yes [provider]  torsemide (DEMADEX) 20 MG tablet Take 20 mg by mouth daily.    Yes [provider]  albuterol (PROVENTIL HFA;VENTOLIN HFA) 108 (90 Base) MCG/ACT inhaler Inhale 1-2 puffs into the lungs every 6 (six) hours as needed for wheezing or shortness of breath. 08/29/16   Copland, Gay Filler, MD  citalopram (CELEXA) 20 MG tablet Take 1 tablet (20 mg total) by mouth daily. Patient not  taking: Reported on 08/13/2017 06/14/17   Copland, Gay Filler, MD  docusate sodium (COLACE) 100 MG capsule Take 1 capsule (100 mg total) by mouth 2 (two) times daily. Patient not taking: Reported on 08/13/2017 05/10/17   Lavonia Drafts, MD  ergocalciferol (VITAMIN D2) 50000 units capsule Take 50,000 Units by mouth every Monday.    [provider]  Lancets [facility] ULTRASOFT) lancets USE AS DIRECTED TO TEST BLOOD SUGARS DAILY DX: E11.9 03/07/16   Mackie Pai, PA-C    Physical Exam: Vitals:   08/13/17 0026 08/13/17 0312 08/13/17 0518 08/13/17 0855  BP: (!) 166/103 (!) 150/78 (!) 154/85 (!) 148/96  Pulse: 82 85 87 93  Resp: 20 14 16 16   Temp: 98.4 F (36.9 C)   98 F (36.7 C)  TempSrc: Oral   Oral  SpO2: 99% 93% 96% 94%    Constitutional: NAD, calm, comfortable Eyes: PERRL, lids and conjunctivae normal.  No icterus. ENMT: Mucous membranes are moist. Posterior pharynx clear of any exudate or lesions. Neck: normal, supple, no masses, no thyromegaly Respiratory: clear to  auscultation bilaterally, no wheezing, no crackles. Normal respiratory effort. No accessory muscle use.  Cardiovascular: Regular rate and rhythm, no murmurs / rubs / gallops. No extremity edema. 2+ pedal pulses. No carotid bruits.  Abdomen: Obese, soft, epigastric and RUQ tenderness, no guarding/rebound/masses palpated. No hepatosplenomegaly. Bowel sounds positive.  Musculoskeletal: no clubbing / cyanosis. No joint deformity upper and lower extremities. Good ROM, no contractures. Normal muscle tone.  Skin: no rashes, lesions, ulcers on limited dermatological examination. Neurologic: CN 2-12 grossly intact. Sensation intact, DTR normal. Strength 5/5 in all 4.  Psychiatric: Normal judgment and insight. Alert and oriented x 4.  Labs on Admission: I have personally reviewed following labs and imaging studies  CBC: Recent Labs  Lab 08/11/17 0959 08/13/17 0116  WBC 8.3 14.7*  NEUTROABS 4.0  --   HGB 13.0 12.8  HCT 38.6 38.2  MCV 85.2 86.2  PLT 227 384   Basic Metabolic Panel: Recent Labs  Lab 08/11/17 0959 08/13/17 0116 08/13/17 0739  NA 138 140  --   K 3.9 4.8  --   CL 104 106  --   CO2 25 24  --   GLUCOSE 170* 181*  --   BUN 16 17  --   CREATININE 1.17* 1.40*  --   CALCIUM 9.1 9.0  --   MG  --   --  1.9  PHOS  --   --  2.4*   GFR: Estimated Creatinine Clearance: 51.3 mL/min (A) (by C-G formula based on SCr of 1.4 mg/dL (H)). Liver Function Tests: Recent Labs  Lab 08/11/17 0959 08/13/17 0116  AST 16 21  ALT 10 11  ALKPHOS 78 76  BILITOT 0.3 0.9  PROT 7.0 6.8  ALBUMIN 3.6 3.4*   Recent Labs  Lab 08/11/17 0959 08/13/17 0116  LIPASE 63* 5,311*   No results for input(s): AMMONIA in the last 168 hours. Coagulation Profile: No results for input(s): INR, PROTIME in the last 168 hours. Cardiac Enzymes: Recent Labs  Lab 08/11/17 1015  TROPONINI <0.03   BNP (last 3 results) No results for input(s): PROBNP in the last 8760 hours. HbA1C: No results for input(s):  HGBA1C in the last 72 hours. CBG: No results for input(s): GLUCAP in the last 168 hours. Lipid Profile: No results for input(s): CHOL, HDL, LDLCALC, TRIG, CHOLHDL, LDLDIRECT in the last 72 hours. Thyroid Function Tests: No results for input(s): TSH, T4TOTAL, FREET4, T3FREE, THYROIDAB  in the last 72 hours. Anemia Panel: No results for input(s): VITAMINB12, FOLATE, FERRITIN, TIBC, IRON, RETICCTPCT in the last 72 hours. Urine analysis:    Component Value Date/Time   COLORURINE YELLOW 08/13/2017 0413   APPEARANCEUR CLEAR 08/13/2017 0413   LABSPEC 1.019 08/13/2017 0413   PHURINE 6.0 08/13/2017 0413   GLUCOSEU NEGATIVE 08/13/2017 0413   HGBUR NEGATIVE 08/13/2017 0413   BILIRUBINUR NEGATIVE 08/13/2017 0413   BILIRUBINUR neg 03/04/2016 0929   KETONESUR NEGATIVE 08/13/2017 0413   PROTEINUR NEGATIVE 08/13/2017 0413   UROBILINOGEN 1.0 03/04/2016 0929   UROBILINOGEN 0.2 01/09/2012 2018   NITRITE NEGATIVE 08/13/2017 0413   LEUKOCYTESUR NEGATIVE 08/13/2017 0413    Radiological Exams on Admission: Ct Abdomen Pelvis W Contrast  Result Date: 08/11/2017 CLINICAL DATA:  Right-sided abdominal pain EXAM: CT ABDOMEN AND PELVIS WITH CONTRAST TECHNIQUE: Multidetector CT imaging of the abdomen and pelvis was performed using the standard protocol following bolus administration of intravenous contrast. CONTRAST:  100 mL ISOVUE-300 IOPAMIDOL (ISOVUE-300) INJECTION 61% COMPARISON:  December 16, 2016 FINDINGS: Lower chest: There is slight atelectatic change in the right middle lobe. There is no lung base edema or consolidation. Hepatobiliary: There is hepatic steatosis. No focal liver lesions are evident. There is no appreciable gallbladder wall thickening. No evident pericholecystic fluid. Pancreas: There is no pancreatic mass or inflammatory focus. Spleen: No splenic lesions are evident. There is a small accessory spleen inferior to the spleen. Adrenals/Urinary Tract: There is stable calcification along the  periphery of the right adrenal. Adrenals bilaterally otherwise appear unremarkable and stable. Kidneys bilaterally show no evident mass or hydronephrosis. No evident renal or ureteral calculus. Urinary bladder is midline with wall thickness within normal limits. Stomach/Bowel: There is moderate stool throughout the colon. There is no appreciable bowel wall or mesenteric thickening. No evident bowel obstruction. There is no free air or portal venous air. Vascular/Lymphatic: There is atherosclerotic calcification in the aorta. No aneurysm evident. Major mesenteric arterial vessels appear patent. There is no appreciable adenopathy in the abdomen or pelvis. Reproductive: Uterus is absent.  There is no evident pelvic mass. Other: Appendix appears normal. There is no abscess or ascites in the abdomen or pelvis. Musculoskeletal: There is degenerative change in the lumbar spine. There are no blastic or lytic bone lesions. There is no intramuscular or abdominal wall lesion evident. IMPRESSION: 1. A cause for patient's symptoms has not been established with this study. 2. Appendix appears normal. No bowel wall thickening or bowel obstruction evident. There is moderate stool in the colon. No abscess is evident in the abdomen or pelvis. 3.  No renal or ureteral calculus evident.  No hydronephrosis. 4. Benign-appearing calcification along the periphery of the right adrenal, stable. Suspect prior trauma in this area as most likely etiology. 5.  Hepatic steatosis. 6.  Aortic atherosclerosis. Aortic Atherosclerosis (ICD10-I70.0). Electronically Signed   By: Lowella Grip III M.D.   On: 08/11/2017 13:53   US Abdomen Limited Ruq  Result Date: 08/11/2017 CLINICAL DATA:  Right upper quadrant pain for several days EXAM: ULTRASOUND ABDOMEN LIMITED RIGHT UPPER QUADRANT COMPARISON:  12/16/2016 CT of the abdomen and pelvis. FINDINGS: Gallbladder: No gallstones or wall thickening visualized. No sonographic Murphy sign noted by  sonographer. Common bile duct: Diameter: 3.7 mm. Liver: Mild increased echogenicity is noted consistent with fatty infiltration. No focal mass lesion is noted. Portal vein is patent on color Doppler imaging with normal direction of blood flow towards the liver. Pancreatic duct is at the upper limits of normal  in size. IMPRESSION: Mild fatty infiltration of the liver. Pancreatic duct at the upper limits of normal in size. Electronically Signed   By: Inez Catalina M.D.   On: 08/11/2017 12:46    EKG: Independently reviewed. Vent. rate 51 BPM PR interval * ms QRS duration 105 ms QT/QTc 429/396 ms P-R-T axes 30 68 24 Sinus rhythm Low voltage, precordial leads  Assessment/Plan Principal Problem:   Acute pancreatitis Admit to telemetry/inpatient. Keep n.p.o. Continue IV fluids. Analgesics as needed. Antiemetics as needed. Hold torsemide Check hepatic function panel daily. Check lipase daily. Patient advised to cease alcohol consumption.  Active Problems:   Tobacco use Nicotine replacement therapy ordered as needed. Staff to provide tobacco cessation information.    Hyperlipidemia Hold pravastatin while having acute pancreatitis. Monitor LFTs daily.    Type 2 diabetes mellitus (HCC) Currently n.p.o. Hold long-acting insulin. CBG monitoring with sensitive sliding scale every 6 hours. Switch to before meals and at bedtime once clear for oral intake.    Essential hypertension Hold torsemide. Continue losartan 100 mg p.o. daily. Monitor blood pressure, renal function electrolytes.    GAD (generalized anxiety disorder) Hold alprazolam while on CIWA program.    CKD (chronic kidney disease) stage 3, GFR 30-59 ml/min (HCC) Monitor renal function electrolytes.    Asthma Supplemental oxygen and bronchodilators as needed.    Depression Not on antidepressants. Denies depressive mood.    Diabetic neuropathy (HCC) Continue Lyrica 75 mg p.o. twice daily.    DVT prophylaxis:  SCDs. Code Status: Full code. Family Communication:  Disposition Plan: Admit for pancreatitis symptoms treatment and CIWA protocol. Consults called: Admission status: Inpatient/telemetry.   Reubin Milan MD [facility] Pager [phone]  If 7PM-7AM, please contact night-coverage [email or web address] Password [facility]  08/13/2017, 9:46 AM

## 2017-08-13 NOTE — ED Notes (Signed)
Pt ambulated to the restroom with assistance. Urine sample is collected

## 2017-08-13 NOTE — ED Notes (Signed)
ED TO INPATIENT HANDOFF REPORT  Name/Age/Gender Renee James 47 y.o. female  Code Status Code Status History    Date Active Date Inactive Code Status Order ID Comments User Context   05/09/2017 1146 05/10/2017 1326 Full Code [id number]  Lavonia Drafts, MD Inpatient      Home/SNF/Other Home  Chief Complaint shortness of Breath;abdominal pain  Level of Care/Admitting Diagnosis ED Disposition    ED Disposition Condition [facility] Area: [facility] [[id number]]  Level of Care: Telemetry [5]  Admit to tele based on following criteria: Monitor for Ischemic changes  Diagnosis: Acute pancreatitis [577.0.ICD-9-CM]  Admitting Physician: Reubin Milan [[id number]]  Attending Physician: Reubin Milan [[id number]]  Estimated length of stay: past midnight tomorrow  Certification:: I certify this patient will need inpatient services for at least 2 midnights  PT Class (Do Not Modify): Inpatient [101]  PT Acc Code (Do Not Modify): Private [1]       Medical History Past Medical History:  Diagnosis Date  . Allergy   . Anxiety   . Arthritis   . Asthma   . Bell's palsy   . Bell's palsy 2012  . Chronic kidney disease (CKD), stage III (moderate) (HCC)   . Depression   . Diabetes mellitus without complication ([place])   . Diabetic neuropathy ([place])   . Headache    migraines  . Hyperlipidemia   . Hypertension   . Pneumonia   . Renal insufficiency 05/13/2015    Allergies Allergies  Allergen Reactions  . Latex Hives, Shortness Of Breath and Itching  . Talc Hives, Shortness Of Breath and Itching  . Ace Inhibitors Cough  . 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, [place], [place] [address]   Comprehensive metabolic panel     Status: Abnormal   Collection Time: 08/13/17  1:16 AM  Result Value Ref Range   Sodium 140 135 - 145 mmol/L   Potassium 4.8 3.5 - 5.1 mmol/L   Chloride 106 98 - 111 mmol/L   CO2 24 22 - 32 mmol/L   Glucose, Bld 181 (H) 70 - 99 mg/dL   BUN 17 6 - 20 mg/dL   Creatinine, Ser 1.40 (H) 0.44 - 1.00 mg/dL   Calcium 9.0 8.9 - 10.3 mg/dL   Total Protein 6.8 6.5 - 8.1 g/dL   Albumin 3.4 (L) 3.5 - 5.0 g/dL   AST 21 15 - 41 U/L   ALT 11 0 - 44 U/L   Alkaline Phosphatase 76 38 - 126 U/L   Total Bilirubin 0.9 0.3 - 1.2 mg/dL   GFR calc non Af Amer 43 (L) >60 mL/min   GFR  calc Af Amer 50 (L) >60 mL/min    Comment: (NOTE) The eGFR has been calculated using the CKD EPI equation. This calculation has not been validated in all clinical situations. eGFR's persistently <60 mL/min signify possible Chronic Kidney Disease.    Anion gap 10 5 - 15    Comment: Performed at [facility], [place] [address], [place], [place] [address]  CBC     Status: Abnormal   Collection Time: 08/13/17  1:16 AM  Result Value Ref Range   WBC 14.7 (H) 4.0 - 10.5 K/uL   RBC 4.43 3.87 - 5.11 MIL/uL   Hemoglobin 12.8 12.0 - 15.0 g/dL   HCT 38.2 36.0 - 46.0 %   MCV 86.2 78.0 - 100.0 fL   MCH 28.9 26.0 - 34.0 pg   MCHC 33.5 30.0 - 36.0 g/dL   RDW 14.2 11.5 - 15.5 %   Platelets 385 150 - 400 K/uL    Comment: Performed at [facility], [place] [address], [place], [place] [address]  Urinalysis, Routine w reflex microscopic     Status: None    Collection Time: 08/13/17  4:13 AM  Result Value Ref Range   Color, Urine YELLOW YELLOW   APPearance CLEAR CLEAR   Specific Gravity, Urine 1.019 1.005 - 1.030   pH 6.0 5.0 - 8.0   Glucose, UA NEGATIVE NEGATIVE mg/dL   Hgb urine dipstick NEGATIVE NEGATIVE   Bilirubin Urine NEGATIVE NEGATIVE   Ketones, ur NEGATIVE NEGATIVE mg/dL   Protein, ur NEGATIVE NEGATIVE mg/dL   Nitrite NEGATIVE NEGATIVE   Leukocytes, UA NEGATIVE NEGATIVE    Comment: Performed at [facility] [address], [place], [place] [address]  Magnesium     Status: None   Collection Time: 08/13/17  7:39 AM  Result Value Ref Range   Magnesium 1.9 1.7 - 2.4 mg/dL    Comment: Performed at [facility], [place] [address] [place], [place] [address]  Phosphorus     Status: Abnormal   Collection Time: 08/13/17  7:39 AM  Result Value Ref Range   Phosphorus 2.4 (L) 2.5 - 4.6 mg/dL    Comment: Performed at [facility], [place] [address], [place], [place] [address]   Ct Abdomen Pelvis W Contrast  Result Date: 08/11/2017 CLINICAL DATA:  Right-sided abdominal pain EXAM: CT ABDOMEN AND PELVIS WITH CONTRAST TECHNIQUE: Multidetector CT imaging of the abdomen and pelvis was performed using the standard protocol following bolus administration of intravenous contrast. CONTRAST:  100 mL ISOVUE-300 IOPAMIDOL (ISOVUE-300) INJECTION 61% COMPARISON:  December 16, 2016 FINDINGS: Lower chest: There is slight atelectatic change in the right middle lobe. There is no lung base edema or consolidation. Hepatobiliary: There is hepatic steatosis. No focal liver lesions are evident. There is no appreciable gallbladder wall thickening. No evident pericholecystic fluid. Pancreas: There is no pancreatic mass or inflammatory focus. Spleen: No splenic lesions are evident. There is a small accessory spleen inferior to the spleen. Adrenals/Urinary Tract: There is stable calcification along the periphery of the  right adrenal. Adrenals bilaterally otherwise appear unremarkable and stable. Kidneys bilaterally show no evident mass or hydronephrosis. No evident renal or ureteral calculus. Urinary bladder is midline with wall thickness within normal limits. Stomach/Bowel: There is moderate stool throughout the colon. There is no appreciable bowel wall or mesenteric thickening. No evident bowel obstruction. There is no free air or portal venous air. Vascular/Lymphatic: There is atherosclerotic calcification in the aorta. No aneurysm evident. Major  mesenteric arterial vessels appear patent. There is no appreciable adenopathy in the abdomen or pelvis. Reproductive: Uterus is absent.  There is no evident pelvic mass. Other: Appendix appears normal. There is no abscess or ascites in the abdomen or pelvis. Musculoskeletal: There is degenerative change in the lumbar spine. There are no blastic or lytic bone lesions. There is no intramuscular or abdominal wall lesion evident. IMPRESSION: 1. A cause for patient's symptoms has not been established with this study. 2. Appendix appears normal. No bowel wall thickening or bowel obstruction evident. There is moderate stool in the colon. No abscess is evident in the abdomen or pelvis. 3.  No renal or ureteral calculus evident.  No hydronephrosis. 4. Benign-appearing calcification along the periphery of the right adrenal, stable. Suspect prior trauma in this area as most likely etiology. 5.  Hepatic steatosis. 6.  Aortic atherosclerosis. Aortic Atherosclerosis (ICD10-I70.0). Electronically Signed   By: Lowella Grip III M.D.   On: 08/11/2017 13:53   US Abdomen Limited Ruq  Result Date: 08/11/2017 CLINICAL DATA:  Right upper quadrant pain for several days EXAM: ULTRASOUND ABDOMEN LIMITED RIGHT UPPER QUADRANT COMPARISON:  12/16/2016 CT of the abdomen and pelvis. FINDINGS: Gallbladder: No gallstones or wall thickening visualized. No sonographic Murphy sign noted by sonographer. Common bile  duct: Diameter: 3.7 mm. Liver: Mild increased echogenicity is noted consistent with fatty infiltration. No focal mass lesion is noted. Portal vein is patent on color Doppler imaging with normal direction of blood flow towards the liver. Pancreatic duct is at the upper limits of normal in size. IMPRESSION: Mild fatty infiltration of the liver. Pancreatic duct at the upper limits of normal in size. Electronically Signed   By: Inez Catalina M.D.   On: 08/11/2017 12:46    Pending Labs [employer] (From admission, onward)    Start     Ordered   Signed and Held  HIV antibody (Routine Testing)  Tomorrow morning,   R     Signed and Held   Signed and Held  CBC WITH DIFFERENTIAL  Daily,   R     Signed and Held   Signed and Held  Comprehensive metabolic panel  Daily,   R     Signed and Held          Vitals/Pain Today's Vitals   08/13/17 0518 08/13/17 0613 08/13/17 0652 08/13/17 0807  BP: (!) 154/85     Pulse: 87     Resp: 16     Temp:      TempSrc:      SpO2: 96%     PainSc:  4  9  0-No pain    Isolation Precautions No active isolations  Medications Medications  LORazepam (ATIVAN) injection 0-4 mg (1 mg Intravenous Given 08/13/17 0647)    Or  LORazepam (ATIVAN) tablet 0-4 mg ( Oral See Alternative 08/13/17 0647)  LORazepam (ATIVAN) injection 0-4 mg (has no administration in time range)    Or  LORazepam (ATIVAN) tablet 0-4 mg (has no administration in time range)  thiamine (VITAMIN B-1) tablet 100 mg (has no administration in time range)    Or  thiamine (B-1) injection 100 mg (has no administration in time range)  fentaNYL (SUBLIMAZE) injection 50 mcg (has no administration in time range)  ondansetron (ZOFRAN) injection 4 mg (has no administration in time range)  0.9 %  sodium chloride infusion ( Intravenous New Bag/Given 08/13/17 0805)  morphine 4 MG/ML injection 4 mg (4 mg Intravenous Given 08/13/17 0117)  ondansetron (  ZOFRAN) injection 4 mg (4 mg Intravenous Given 08/13/17 0117)   morphine 4 MG/ML injection 4 mg (4 mg Intravenous Given 08/13/17 0315)  sodium chloride 0.9 % bolus 1,000 mL (0 mLs Intravenous Stopped 08/13/17 0613)  fentaNYL (SUBLIMAZE) injection 50 mcg (50 mcg Intravenous Given 08/13/17 0530)    Mobility walks

## 2017-08-13 NOTE — ED Notes (Signed)
X-ray tech told this RN that pt is refused to get xray of chest done "because it is not my chest that hurts it is my abdomen." PA notified and is at bedside talking to pt

## 2017-08-13 NOTE — ED Triage Notes (Signed)
Pt reports she was just seen several days ago for right sided pain and now she is having pain all through her abdomen, associated with vomiting. Denies fevers. Reports she is taking the muscle relaxer but now having any relief.
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
## 2017-08-14 DIAGNOSIS — K859 Acute pancreatitis without necrosis or infection, unspecified: Secondary | ICD-10-CM

## 2017-08-14 LAB — COMPREHENSIVE METABOLIC PANEL
ALT: 9 U/L (ref 0–44)
ANION GAP: 9 (ref 5–15)
AST: 14 U/L — ABNORMAL LOW (ref 15–41)
Albumin: 2.9 g/dL — ABNORMAL LOW (ref 3.5–5.0)
Alkaline Phosphatase: 68 U/L (ref 38–126)
BILIRUBIN TOTAL: 0.7 mg/dL (ref 0.3–1.2)
BUN: 10 mg/dL (ref 6–20)
CO2: 22 mmol/L (ref 22–32)
Calcium: 8.5 mg/dL — ABNORMAL LOW (ref 8.9–10.3)
Chloride: 108 mmol/L (ref 98–111)
Creatinine, Ser: 1.1 mg/dL — ABNORMAL HIGH (ref 0.44–1.00)
GFR, EST NON AFRICAN AMERICAN: 58 mL/min — AB (ref >60)
Glucose, Bld: 85 mg/dL (ref 70–99)
POTASSIUM: 3.6 mmol/L (ref 3.5–5.1)
Sodium: 139 mmol/L (ref 135–145)
Total Protein: 6.2 g/dL — ABNORMAL LOW (ref 6.5–8.1)

## 2017-08-14 LAB — CBC WITH DIFFERENTIAL/PLATELET
BASOS PCT: 0 %
Basophils Absolute: 0 10*3/uL (ref 0.0–0.1)
EOS PCT: 1 %
Eosinophils Absolute: 0.2 10*3/uL (ref 0.0–0.7)
HEMATOCRIT: 35.6 % — AB (ref 36.0–46.0)
Hemoglobin: 11.9 g/dL — ABNORMAL LOW (ref 12.0–15.0)
LYMPHS PCT: 20 %
Lymphs Abs: 2.4 10*3/uL (ref 0.7–4.0)
MCH: 28.6 pg (ref 26.0–34.0)
MCHC: 33.4 g/dL (ref 30.0–36.0)
MCV: 85.6 fL (ref 78.0–100.0)
MONOS PCT: 8 %
Monocytes Absolute: 1 10*3/uL (ref 0.1–1.0)
NEUTROS ABS: 8.1 10*3/uL — AB (ref 1.7–7.7)
Neutrophils Relative %: 71 %
Platelets: 336 10*3/uL (ref 150–400)
RBC: 4.16 MIL/uL (ref 3.87–5.11)
RDW: 14.5 % (ref 11.5–15.5)
WBC: 11.6 10*3/uL — ABNORMAL HIGH (ref 4.0–10.5)

## 2017-08-14 LAB — GLUCOSE, CAPILLARY
GLUCOSE-CAPILLARY: 72 mg/dL (ref 70–99)
GLUCOSE-CAPILLARY: 75 mg/dL (ref 70–99)
GLUCOSE-CAPILLARY: 83 mg/dL (ref 70–99)
GLUCOSE-CAPILLARY: 85 mg/dL (ref 70–99)
Glucose-Capillary: 73 mg/dL (ref 70–99)
Glucose-Capillary: 69 mg/dL — ABNORMAL LOW (ref 70–99)
Glucose-Capillary: 105 mg/dL — ABNORMAL HIGH (ref 70–99)

## 2017-08-14 LAB — LIPASE, BLOOD: LIPASE: 342 U/L — AB (ref 11–51)

## 2017-08-14 LAB — HIV ANTIBODY (ROUTINE TESTING W REFLEX): HIV Screen 4th Generation wRfx: NONREACTIVE

## 2017-08-14 MED ORDER — GLUCOSE 40 % PO GEL
ORAL | Status: AC
  Administered 2017-08-14: 37.5 g
  Filled 2017-08-14: qty 1

## 2017-08-14 MED ORDER — DEXTROSE-NACL 5-0.9 % IV SOLN
INTRAVENOUS | Status: DC
  Administered 2017-08-14 – 2017-08-15 (×3): via INTRAVENOUS

## 2017-08-14 MED ORDER — HYDRALAZINE HCL 20 MG/ML IJ SOLN: 10.0000 mg | Freq: Four times a day (QID) | INTRAMUSCULAR | Status: DC | PRN

## 2017-08-14 MED ORDER — FENTANYL CITRATE (PF) 100 MCG/2ML IJ SOLN
50.0000 ug | INTRAMUSCULAR | Status: DC | PRN
  Administered 2017-08-14: 50 ug via INTRAVENOUS
  Filled 2017-08-14 (×2): qty 2

## 2017-08-14 MED ORDER — BISACODYL 10 MG RE SUPP
10.0000 mg | Freq: Once | RECTAL | Status: AC
  Administered 2017-08-14: 10 mg via RECTAL
  Filled 2017-08-14: qty 1

## 2017-08-14 MED ORDER — FENTANYL CITRATE (PF) 100 MCG/2ML IJ SOLN
50.0000 ug | INTRAMUSCULAR | Status: DC | PRN
  Administered 2017-08-14: 50 ug via INTRAVENOUS
  Filled 2017-08-14: qty 2
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
## 2017-08-15 LAB — COMPREHENSIVE METABOLIC PANEL
ALBUMIN: 2.7 g/dL — AB (ref 3.5–5.0)
ALK PHOS: 64 U/L (ref 38–126)
ALT: 9 U/L (ref 0–44)
ANION GAP: 7 (ref 5–15)
AST: 14 U/L — AB (ref 15–41)
BUN: 9 mg/dL (ref 6–20)
CALCIUM: 8.4 mg/dL — AB (ref 8.9–10.3)
CO2: 23 mmol/L (ref 22–32)
Chloride: 110 mmol/L (ref 98–111)
Creatinine, Ser: 1.05 mg/dL — ABNORMAL HIGH (ref 0.44–1.00)
GFR calc Af Amer: >60 mL/min (ref >60)
GFR calc non Af Amer: >60 mL/min (ref >60)
GLUCOSE: 106 mg/dL — AB (ref 70–99)
Potassium: 3.3 mmol/L — ABNORMAL LOW (ref 3.5–5.1)
SODIUM: 140 mmol/L (ref 135–145)
Total Bilirubin: 0.6 mg/dL (ref 0.3–1.2)
Total Protein: 6 g/dL — ABNORMAL LOW (ref 6.5–8.1)

## 2017-08-15 LAB — CBC WITH DIFFERENTIAL/PLATELET
BASOS ABS: 0 10*3/uL (ref 0.0–0.1)
BASOS PCT: 0 %
EOS ABS: 0.5 10*3/uL (ref 0.0–0.7)
Eosinophils Relative: 5 %
HCT: 33.6 % — ABNORMAL LOW (ref 36.0–46.0)
HEMOGLOBIN: 11.3 g/dL — AB (ref 12.0–15.0)
Lymphocytes Relative: 25 %
Lymphs Abs: 2.4 10*3/uL (ref 0.7–4.0)
MCH: 28.6 pg (ref 26.0–34.0)
MCHC: 33.6 g/dL (ref 30.0–36.0)
MCV: 85.1 fL (ref 78.0–100.0)
MONOS PCT: 9 %
Monocytes Absolute: 0.9 10*3/uL (ref 0.1–1.0)
NEUTROS ABS: 5.6 10*3/uL (ref 1.7–7.7)
NEUTROS PCT: 61 %
Platelets: 314 10*3/uL (ref 150–400)
RBC: 3.95 MIL/uL (ref 3.87–5.11)
RDW: 14.3 % (ref 11.5–15.5)
WBC: 9.4 10*3/uL (ref 4.0–10.5)

## 2017-08-15 LAB — GLUCOSE, CAPILLARY
GLUCOSE-CAPILLARY: 102 mg/dL — AB (ref 70–99)
GLUCOSE-CAPILLARY: 194 mg/dL — AB (ref 70–99)
GLUCOSE-CAPILLARY: 197 mg/dL — AB (ref 70–99)
GLUCOSE-CAPILLARY: 106 mg/dL — AB (ref 70–99)
GLUCOSE-CAPILLARY: 150 mg/dL — AB (ref 70–99)

## 2017-08-15 LAB — LIPID PANEL
Cholesterol: 170 mg/dL (ref 0–200)
HDL: 35 mg/dL — AB (ref >40)
LDL CALC: 117 mg/dL — AB (ref 0–99)
TRIGLYCERIDES: 89 mg/dL (ref <150)
Total CHOL/HDL Ratio: 4.9 RATIO
VLDL: 18 mg/dL (ref 0–40)

## 2017-08-15 LAB — LIPASE, BLOOD: Lipase: 86 U/L — ABNORMAL HIGH (ref 11–51)

## 2017-08-15 LAB — MAGNESIUM: Magnesium: 1.6 mg/dL — ABNORMAL LOW (ref 1.7–2.4)

## 2017-08-15 MED ORDER — MAGNESIUM OXIDE 400 (241.3 MG) MG PO TABS
800.0000 mg | ORAL_TABLET | Freq: Once | ORAL | Status: AC
  Administered 2017-08-15: 800 mg via ORAL
  Filled 2017-08-15: qty 2

## 2017-08-15 MED ORDER — BISACODYL 5 MG PO TBEC
5.0000 mg | DELAYED_RELEASE_TABLET | Freq: Once | ORAL | Status: AC
  Administered 2017-08-15: 5 mg via ORAL
  Filled 2017-08-15: qty 1

## 2017-08-15 MED ORDER — OXYCODONE-ACETAMINOPHEN 5-325 MG PO TABS
1.0000 | ORAL_TABLET | ORAL | Status: DC | PRN
  Administered 2017-08-15 – 2017-08-16 (×4): 1 via ORAL
  Filled 2017-08-15 (×4): qty 1

## 2017-08-15 MED ORDER — SODIUM CHLORIDE 0.9 % IV SOLN
INTRAVENOUS | Status: DC
  Administered 2017-08-15: 13:00:00 via INTRAVENOUS

## 2017-08-15 MED ORDER — POTASSIUM CHLORIDE CRYS ER 20 MEQ PO TBCR
40.0000 meq | EXTENDED_RELEASE_TABLET | Freq: Once | ORAL | Status: AC
  Administered 2017-08-15: 40 meq via ORAL
  Filled 2017-08-15: qty 2

## 2017-08-15 MED ORDER — POLYETHYLENE GLYCOL 3350 17 G PO PACK
17.0000 g | PACK | Freq: Every day | ORAL | Status: DC
  Administered 2017-08-15 – 2017-08-16 (×2): 17 g via ORAL
  Filled 2017-08-15 (×2): qty 1

## 2017-08-15 MED ORDER — FAMOTIDINE 20 MG PO TABS
20.0000 mg | ORAL_TABLET | Freq: Two times a day (BID) | ORAL | Status: DC
  Administered 2017-08-15 – 2017-08-16 (×2): 20 mg via ORAL
  Filled 2017-08-15 (×2): qty 1

## 2017-08-15 MED ORDER — TRAMADOL HCL 50 MG PO TABS: 50.0000 mg | ORAL_TABLET | Freq: Four times a day (QID) | ORAL | Status: DC | PRN

## 2017-08-15 MED ORDER — OXYCODONE-ACETAMINOPHEN 5-325 MG PO TABS
1.0000 | ORAL_TABLET | ORAL | Status: DC | PRN
  Administered 2017-08-15: 2 via ORAL
  Filled 2017-08-15: qty 2

## 2017-08-15 MED ORDER — DIPHENHYDRAMINE HCL 25 MG PO CAPS
25.0000 mg | ORAL_CAPSULE | Freq: Four times a day (QID) | ORAL | Status: DC | PRN
  Filled 2017-08-15: qty 1

## 2017-08-15 NOTE — Progress Notes (Signed)
At present time there are no discharge needs.
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
## 2017-08-16 DIAGNOSIS — I10 Essential (primary) hypertension: Secondary | ICD-10-CM

## 2017-08-16 DIAGNOSIS — N183 Chronic kidney disease, stage 3 (moderate): Secondary | ICD-10-CM

## 2017-08-16 LAB — COMPREHENSIVE METABOLIC PANEL
ALBUMIN: 2.9 g/dL — AB (ref 3.5–5.0)
ALT: 10 U/L (ref 0–44)
AST: 13 U/L — AB (ref 15–41)
Alkaline Phosphatase: 60 U/L (ref 38–126)
Anion gap: 5 (ref 5–15)
BILIRUBIN TOTAL: 0.6 mg/dL (ref 0.3–1.2)
BUN: 7 mg/dL (ref 6–20)
CHLORIDE: 111 mmol/L (ref 98–111)
CO2: 25 mmol/L (ref 22–32)
CREATININE: 1.13 mg/dL — AB (ref 0.44–1.00)
Calcium: 8.7 mg/dL — ABNORMAL LOW (ref 8.9–10.3)
GFR calc Af Amer: >60 mL/min (ref >60)
GFR, EST NON AFRICAN AMERICAN: 56 mL/min — AB (ref >60)
GLUCOSE: 130 mg/dL — AB (ref 70–99)
Potassium: 4.6 mmol/L (ref 3.5–5.1)
Sodium: 141 mmol/L (ref 135–145)
Total Protein: 6.1 g/dL — ABNORMAL LOW (ref 6.5–8.1)

## 2017-08-16 LAB — CBC WITH DIFFERENTIAL/PLATELET
BASOS PCT: 0 %
Basophils Absolute: 0 10*3/uL (ref 0.0–0.1)
Eosinophils Absolute: 0.7 10*3/uL (ref 0.0–0.7)
Eosinophils Relative: 9 %
HEMATOCRIT: 34.2 % — AB (ref 36.0–46.0)
Hemoglobin: 11.3 g/dL — ABNORMAL LOW (ref 12.0–15.0)
LYMPHS PCT: 33 %
Lymphs Abs: 2.7 10*3/uL (ref 0.7–4.0)
MCH: 28.3 pg (ref 26.0–34.0)
MCHC: 33 g/dL (ref 30.0–36.0)
MCV: 85.7 fL (ref 78.0–100.0)
MONO ABS: 0.7 10*3/uL (ref 0.1–1.0)
Monocytes Relative: 8 %
NEUTROS ABS: 4 10*3/uL (ref 1.7–7.7)
NEUTROS PCT: 50 %
Platelets: 326 10*3/uL (ref 150–400)
RBC: 3.99 MIL/uL (ref 3.87–5.11)
RDW: 14.3 % (ref 11.5–15.5)
WBC: 8 10*3/uL (ref 4.0–10.5)

## 2017-08-16 LAB — LIPASE, BLOOD: Lipase: 49 U/L (ref 11–51)

## 2017-08-16 LAB — GLUCOSE, CAPILLARY
GLUCOSE-CAPILLARY: 127 mg/dL — AB (ref 70–99)
GLUCOSE-CAPILLARY: 137 mg/dL — AB (ref 70–99)

## 2017-08-16 MED ORDER — LACTULOSE 10 GM/15ML PO SOLN
20.0000 g | Freq: Once | ORAL | Status: AC
  Administered 2017-08-16: 20 g via ORAL
  Filled 2017-08-16: qty 30

## 2017-08-16 MED ORDER — HYDROCODONE-ACETAMINOPHEN 5-325 MG PO TABS: 1.0000 | ORAL_TABLET | Freq: Four times a day (QID) | ORAL | 0 refills | Status: AC | PRN

## 2017-08-16 MED ORDER — HYDROCODONE-ACETAMINOPHEN 5-325 MG PO TABS: 1.0000 | ORAL_TABLET | Freq: Four times a day (QID) | ORAL | 0 refills | Status: DC | PRN

## 2017-08-16 MED ORDER — POLYETHYLENE GLYCOL 3350 17 G PO PACK: 17.0000 g | PACK | Freq: Every day | ORAL | 0 refills | Status: DC

## 2017-08-16 MED ORDER — ACETAMINOPHEN 500 MG PO TABS: 1000.0000 mg | ORAL_TABLET | Freq: Four times a day (QID) | ORAL | 0 refills | Status: DC | PRN

## 2017-08-16 MED ORDER — FAMOTIDINE 20 MG PO TABS: 20.0000 mg | ORAL_TABLET | Freq: Two times a day (BID) | ORAL | 0 refills | Status: AC

## 2017-08-16 NOTE — Progress Notes (Signed)
08/16/17  1713  Reviewed discharge instructions with patient. Patient verbalized understanding of discharge instructions. Copy of discharge instructions given to patient.

## 2017-08-16 NOTE — Discharge Summary (Signed)
Physician Discharge Summary  Renee James  ZOX:[id number]  DOB: [birth year]  DOA: 08/13/2017 PCP: Darreld Mclean, MD  Admit date: 08/13/2017 Discharge date: 08/16/2017  Admitted From: Home  Disposition: Home   Recommendations for Outpatient Follow-up:  1. Follow up with PCP in 1 week 2. Please obtain BMP/CBC in one week to monitor renal function and hemoglobin 3. Alcohol cessation discussed  Discharge Condition: Stable CODE STATUS: Full code Diet recommendation: Heart Healthy / Carb Modified   Brief/Interim Summary: For full details see H&P/Progress note, but in brief, Renee James is a 47 year old female with past medical history significant for diabetes, hypertension, alcohol abuse, asthma/COPD who presented to the emergency department complaining of abdominal pain. Upon ED evaluation CT of the abdomen shows moderate stool burden. And lipase increased to 5311. Patient was admitted with working diagnosis of acute pancreatitis.  Subjective: Patient seen and examined, tolerating diet.  Complaining of abdominal soreness.  No nausea no vomiting.  Had 3 bowel movement absent lactulose given.  No acute events overnight.  Discharge Diagnoses/Hospital Course:  Principal Problem:   Acute pancreatitis Active Problems:   Hyperlipidemia   Essential hypertension   GAD (generalized anxiety disorder)   CKD (chronic kidney disease) stage 3, GFR 30-59 ml/min (HCC)   Asthma   Depression   Diabetic neuropathy (HCC)   Tobacco use   Type 2 diabetes mellitus (HCC)  Acute pancreatitis Alcohol induced, ultrasound negative for gallbladder disease. Lipase normal. Tolerating diet well.  Patient was treated with supportive treatment, IV fluids, pain and nausea control.  Follow-up with PCP.  Alcohol cessation was discussed.  AKI on CKD stage II Treated with IV fluids, creatinine back to baseline.  Check BMP in 1 week.  Avoid nephrotoxic agent.  Alcohol abuse Patient out of window for alcohol  withdrawal  Cessation discussed   Hypertension Slight above goal, Continue Losartan and follow up with PCP to monitor BP   Hypokalemia and hypomag  Repleted Check labs in 1-2 weeks   DM type II CBG's stable during hospital stay,resume home medications with no changes   Asthma/COPD Stable no signs of exacerbation  All other chronic medical condition were stable during the hospitalization.  On the day of the discharge the patient's vitals were stable, and no other acute medical condition were reported by patient. the patient was felt safe to be discharge to home.   Discharge Instructions  You were cared for by a hospitalist during your hospital stay. If you have any questions about your discharge medications or the care you received while you were in the hospital after you are discharged, you can call the unit and asked to speak with the hospitalist on call if the hospitalist that took care of you is not available. Once you are discharged, your primary care physician will handle any further medical issues. Please note that NO REFILLS for any discharge medications will be authorized once you are discharged, as it is imperative that you return to your primary care physician (or establish a relationship with a primary care physician if you do not have one) for your aftercare needs so that they can reassess your need for medications and monitor your lab values.  Discharge Instructions    Call MD for:  difficulty breathing, headache or visual disturbances   Complete by:  As directed    Call MD for:  extreme fatigue   Complete by:  As directed    Call MD for:  hives   Complete by:  As directed  Call MD for:  persistant dizziness or light-headedness   Complete by:  As directed    Call MD for:  persistant nausea and vomiting   Complete by:  As directed    Call MD for:  redness, tenderness, or signs of infection (pain, swelling, redness, odor or green/yellow discharge around incision site)    Complete by:  As directed    Call MD for:  severe uncontrolled pain   Complete by:  As directed    Call MD for:  temperature >100.4   Complete by:  As directed    Diet - low sodium heart healthy   Complete by:  As directed    Increase activity slowly   Complete by:  As directed      Allergies as of 08/16/2017      Reactions   Latex Hives, Shortness Of Breath, Itching   Talc Hives, Shortness Of Breath, Itching   Ace Inhibitors Cough   Other Swelling, Other (See Comments)   Patient is allergic to alligator meat, caused her to "lump up"      Medication List    STOP taking these medications   docusate sodium 100 MG capsule Commonly known as:  COLACE     TAKE these medications   acetaminophen 500 MG tablet Commonly known as:  TYLENOL Take 2 tablets (1,000 mg total) by mouth every 6 (six) hours as needed for moderate pain. What changed:  how much to take   albuterol 108 (90 Base) MCG/ACT inhaler Commonly known as:  PROVENTIL HFA;VENTOLIN HFA Inhale 1-2 puffs into the lungs every 6 (six) hours as needed for wheezing or shortness of breath.   ALPRAZolam 0.25 MG tablet Commonly known as:  XANAX Take 1 tablet (0.25 mg total) by mouth at bedtime as needed for anxiety or sleep.   citalopram 20 MG tablet Commonly known as:  CELEXA Take 1 tablet (20 mg total) by mouth daily.   ergocalciferol 50000 units capsule Commonly known as:  VITAMIN D2 Take 50,000 Units by mouth every Monday.   famotidine 20 MG tablet Commonly known as:  PEPCID Take 1 tablet (20 mg total) by mouth 2 (two) times daily.   HYDROcodone-acetaminophen 5-325 MG tablet Commonly known as:  NORCO/VICODIN Take 1 tablet by mouth every 6 (six) hours as needed. What changed:  reasons to take this   Insulin Detemir 100 UNIT/ML Pen Commonly known as:  LEVEMIR Inject 10 Units into the skin daily at 10 pm. Check your morning blood sugar every day. If morning blood sugars aren't at goal (100), then increase the  amount of evening levemir insulin by 1 unit every night until fasting blood sugars  goal of less than 100 is reached. What changed:    how much to take  when to take this   losartan 100 MG tablet Commonly known as:  COZAAR TAKE 1 TABLET BY MOUTH ONCE DAILY   metFORMIN 500 MG 24 hr tablet Commonly known as:  GLUCOPHAGE-XR Take 4 tablets (2,000 mg total) by mouth daily.   methocarbamol 500 MG tablet Commonly known as:  ROBAXIN Take 1 tablet (500 mg total) by mouth 2 (two) times daily.   onetouch ultrasoft lancets USE AS DIRECTED TO TEST BLOOD SUGARS DAILY DX: E11.9   pioglitazone 30 MG tablet Commonly known as:  ACTOS Take 30 mg by mouth daily.   polyethylene glycol packet Commonly known as:  MIRALAX / GLYCOLAX Take 17 g by mouth daily. Start taking on:  08/17/2017   pravastatin 20  MG tablet Commonly known as:  PRAVACHOL Take 20 mg by mouth daily.   pregabalin 75 MG capsule Commonly known as:  LYRICA Take 75 mg by mouth daily as needed (for pain).   torsemide 20 MG tablet Commonly known as:  DEMADEX Take 20 mg by mouth daily.      Follow-up Information    Copland, Gay Filler, MD. Schedule an appointment as soon as possible for a visit in 1 week(s).   Specialty:  Family Medicine Why:  Hospital follow-up Contact information: [address] [place] [address] [phone]          Allergies  Allergen Reactions  . Latex Hives, Shortness Of Breath and Itching  . Talc Hives, Shortness Of Breath and Itching  . Ace Inhibitors Cough  . Other Swelling and Other (See Comments)    Patient is allergic to alligator meat, caused her to "lump up"    Consultations:  None   Procedures/Studies: Mr Lumbar Spine Wo Contrast  Result Date: 08/07/2017 CLINICAL DATA:  Low back pain, burning sensation lower extremities for 4 years, worsening for 1 year. Symptoms worse with standing for long period. History of hysterectomy. EXAM: MRI LUMBAR SPINE WITHOUT  CONTRAST TECHNIQUE: Multiplanar, multisequence MR imaging of the lumbar spine was performed. No intravenous contrast was administered. COMPARISON:  CT abdomen and pelvis December 16, 2016 and lumbar spine radiographs August 29, 2016. FINDINGS: SEGMENTATION: For the purposes of this report, the last well-formed intervertebral disc is reported as L5-S1. ALIGNMENT: Maintained lumbar lordosis. Minimal grade 1 L4-5 anterolisthesis without spondylolysis. VERTEBRAE:Vertebral bodies are intact. Mild L4-5 disc height loss with mild desiccation. No abnormal or acute bone marrow signal. CONUS MEDULLARIS AND CAUDA EQUINA: Conus medullaris terminates at T12-L1 and demonstrates normal morphology and signal characteristics. Cauda equina is normal. PARASPINAL AND OTHER SOFT TISSUES: Included prevertebral and paraspinal soft tissues are normal. T12-L1 through L1-2: No disc bulge, canal stenosis nor neural foraminal narrowing. L2-3: No disc bulge, canal stenosis nor neural foraminal narrowing. Mild facet arthropathy. L3-4: Small RIGHT subarticular disc protrusion. Mild facet arthropathy and ligamentum flavum redundancy. No canal stenosis. Minimal RIGHT neural foraminal narrowing. L4-5: Anterolisthesis. Annular bulging. Moderate to severe facet arthropathy and ligamentum flavum redundancy resulting in moderate canal stenosis. Mild bilateral neural foraminal narrowing. L5-S1: No disc bulge. Severe facet arthropathy with trace facet effusions. No canal stenosis. No neural foraminal narrowing. IMPRESSION: 1. Degenerative change of the lower lumbar spine predominately on the basis of facet arthropathy. Minimal grade 1 L4-5 anterolisthesis without spondylolysis. 2. Moderate canal stenosis L3-4. Minimal to mild L3-4 and L4-5 neural foraminal narrowing. Electronically Signed   By: Elon Alas M.D.   On: 08/07/2017 15:20   Ct Abdomen Pelvis W Contrast  Result Date: 08/11/2017 CLINICAL DATA:  Right-sided abdominal pain EXAM: CT  ABDOMEN AND PELVIS WITH CONTRAST TECHNIQUE: Multidetector CT imaging of the abdomen and pelvis was performed using the standard protocol following bolus administration of intravenous contrast. CONTRAST:  100 mL ISOVUE-300 IOPAMIDOL (ISOVUE-300) INJECTION 61% COMPARISON:  December 16, 2016 FINDINGS: Lower chest: There is slight atelectatic change in the right middle lobe. There is no lung base edema or consolidation. Hepatobiliary: There is hepatic steatosis. No focal liver lesions are evident. There is no appreciable gallbladder wall thickening. No evident pericholecystic fluid. Pancreas: There is no pancreatic mass or inflammatory focus. Spleen: No splenic lesions are evident. There is a small accessory spleen inferior to the spleen. Adrenals/Urinary Tract: There is stable calcification along the periphery of  the right adrenal. Adrenals bilaterally otherwise appear unremarkable and stable. Kidneys bilaterally show no evident mass or hydronephrosis. No evident renal or ureteral calculus. Urinary bladder is midline with wall thickness within normal limits. Stomach/Bowel: There is moderate stool throughout the colon. There is no appreciable bowel wall or mesenteric thickening. No evident bowel obstruction. There is no free air or portal venous air. Vascular/Lymphatic: There is atherosclerotic calcification in the aorta. No aneurysm evident. Major mesenteric arterial vessels appear patent. There is no appreciable adenopathy in the abdomen or pelvis. Reproductive: Uterus is absent.  There is no evident pelvic mass. Other: Appendix appears normal. There is no abscess or ascites in the abdomen or pelvis. Musculoskeletal: There is degenerative change in the lumbar spine. There are no blastic or lytic bone lesions. There is no intramuscular or abdominal wall lesion evident. IMPRESSION: 1. A cause for patient's symptoms has not been established with this study. 2. Appendix appears normal. No bowel wall thickening or bowel  obstruction evident. There is moderate stool in the colon. No abscess is evident in the abdomen or pelvis. 3.  No renal or ureteral calculus evident.  No hydronephrosis. 4. Benign-appearing calcification along the periphery of the right adrenal, stable. Suspect prior trauma in this area as most likely etiology. 5.  Hepatic steatosis. 6.  Aortic atherosclerosis. Aortic Atherosclerosis (ICD10-I70.0). Electronically Signed   By: Lowella Grip III M.D.   On: 08/11/2017 13:53   US Abdomen Limited Ruq  Result Date: 08/11/2017 CLINICAL DATA:  Right upper quadrant pain for several days EXAM: ULTRASOUND ABDOMEN LIMITED RIGHT UPPER QUADRANT COMPARISON:  12/16/2016 CT of the abdomen and pelvis. FINDINGS: Gallbladder: No gallstones or wall thickening visualized. No sonographic Murphy sign noted by sonographer. Common bile duct: Diameter: 3.7 mm. Liver: Mild increased echogenicity is noted consistent with fatty infiltration. No focal mass lesion is noted. Portal vein is patent on color Doppler imaging with normal direction of blood flow towards the liver. Pancreatic duct is at the upper limits of normal in size. IMPRESSION: Mild fatty infiltration of the liver. Pancreatic duct at the upper limits of normal in size. Electronically Signed   By: Inez Catalina M.D.   On: 08/11/2017 12:46    Discharge Exam: Vitals:   08/16/17 0947 08/16/17 1403  BP: 127/67 (!) 151/77  Pulse: (!) 52 (!) 55  Resp: 16 20  Temp:    SpO2: 99% 100%   Vitals:   08/15/17 2107 08/16/17 0422 08/16/17 0947 08/16/17 1403  BP: (!) 159/86 (!) 149/69 127/67 (!) 151/77  Pulse: 65 (!) 55 (!) 52 (!) 55  Resp: 16 16 16 20   Temp: 98.5 F (36.9 C) 97.7 F (36.5 C)    TempSrc: Oral Oral    SpO2: 100% 100% 99% 100%  Weight:      Height:        General: Pt is alert, awake, not in acute distress Cardiovascular: RRR, S1/S2 +, no rubs, no gallops Respiratory: CTA bilaterally, no wheezing, no rhonchi Abdominal: Soft, NT, ND, bowel sounds  + Extremities: no edema, no cyanosis  The results of significant diagnostics from this hospitalization (including imaging, microbiology, ancillary and laboratory) are listed below for reference.     Microbiology: No results found for this or any previous visit (from the past 240 hour(s)).   Labs: BNP (last 3 results) No results for input(s): BNP in the last 8760 hours. Basic Metabolic Panel: Recent Labs  Lab 08/11/17 4818 08/13/17 0116 08/13/17 5631 08/14/17 0455 08/15/17 0438 08/16/17 0441  NA 138 140  --  139 140 141  K 3.9 4.8  --  3.6 3.3* 4.6  CL 104 106  --  108 110 111  CO2 25 24  --  22 23 25   GLUCOSE 170* 181*  --  85 106* 130*  BUN 16 17  --  10 9 7   CREATININE 1.17* 1.40*  --  1.10* 1.05* 1.13*  CALCIUM 9.1 9.0  --  8.5* 8.4* 8.7*  MG  --   --  1.9  --  1.6*  --   PHOS  --   --  2.4*  --   --   --    Liver Function Tests: Recent Labs  Lab 08/11/17 0959 08/13/17 0116 08/14/17 0455 08/15/17 0438 08/16/17 0441  AST 16 21 14* 14* 13*  ALT 10 11 9 9 10   ALKPHOS 78 76 68 64 60  BILITOT 0.3 0.9 0.7 0.6 0.6  PROT 7.0 6.8 6.2* 6.0* 6.1*  ALBUMIN 3.6 3.4* 2.9* 2.7* 2.9*   Recent Labs  Lab 08/11/17 0959 08/13/17 0116 08/14/17 0455 08/15/17 0438 08/16/17 0441  LIPASE 63* 5,311* 342* 86* 49   No results for input(s): AMMONIA in the last 168 hours. CBC: Recent Labs  Lab 08/11/17 0959 08/13/17 0116 08/14/17 0455 08/15/17 0438 08/16/17 0441  WBC 8.3 14.7* 11.6* 9.4 8.0  NEUTROABS 4.0  --  8.1* 5.6 4.0  HGB 13.0 12.8 11.9* 11.3* 11.3*  HCT 38.6 38.2 35.6* 33.6* 34.2*  MCV 85.2 86.2 85.6 85.1 85.7  PLT 227 385 336 314 326   Cardiac Enzymes: Recent Labs  Lab 08/11/17 1015  TROPONINI <0.03   BNP: Invalid input(s): POCBNP CBG: Recent Labs  Lab 08/15/17 1409 08/15/17 1642 08/15/17 2141 08/16/17 0806 08/16/17 1126  GLUCAP 197* 106* 150* 127* 137*   D-Dimer No results for input(s): DDIMER in the last 72 hours. Hgb A1c No results for  input(s): HGBA1C in the last 72 hours. Lipid Profile Recent Labs    08/15/17 0438  CHOL 170  HDL 35*  LDLCALC 117*  TRIG 89  CHOLHDL 4.9   Thyroid function studies No results for input(s): TSH, T4TOTAL, T3FREE, THYROIDAB in the last 72 hours.  Invalid input(s): FREET3 Anemia work up No results for input(s): VITAMINB12, FOLATE, FERRITIN, TIBC, IRON, RETICCTPCT in the last 72 hours. Urinalysis    Component Value Date/Time   COLORURINE YELLOW 08/13/2017 0413   APPEARANCEUR CLEAR 08/13/2017 0413   LABSPEC 1.019 08/13/2017 0413   PHURINE 6.0 08/13/2017 0413   GLUCOSEU NEGATIVE 08/13/2017 0413   HGBUR NEGATIVE 08/13/2017 0413   BILIRUBINUR NEGATIVE 08/13/2017 0413   BILIRUBINUR neg 03/04/2016 0929   KETONESUR NEGATIVE 08/13/2017 0413   PROTEINUR NEGATIVE 08/13/2017 0413   UROBILINOGEN 1.0 03/04/2016 0929   UROBILINOGEN 0.2 01/09/2012 2018   NITRITE NEGATIVE 08/13/2017 0413   LEUKOCYTESUR NEGATIVE 08/13/2017 0413   Sepsis Labs Invalid input(s): PROCALCITONIN,  WBC,  LACTICIDVEN Microbiology No results found for this or any previous visit (from the past 240 hour(s)).  Time coordinating discharge: 32 minutes  SIGNED:  Chipper Oman, MD  [facility] 08/16/2017, 2:42 PM  Pager please text page via  [email or web address]  Note - This record has been created using [employer]. Chart creation errors have been sought, but may not always have been located. Such creation errors do not reflect on the standard of medical care.

## 2017-08-16 NOTE — Plan of Care (Signed)
  Problem: Nutrition: Goal: Adequate nutrition will be maintained Outcome: Progressing   Problem: Elimination: Goal: Will not experience complications related to bowel motility Outcome: Progressing   Problem: Elimination: Goal: Will not experience complications related to urinary retention Outcome: Progressing   Problem: Activity: Goal: Risk for activity intolerance will decrease Outcome: Progressing

## 2017-08-17 ENCOUNTER — Telehealth: Payer: Self-pay

## 2017-08-17 NOTE — Telephone Encounter (Signed)
08/17/17  Transition Care Management Follow-up Telephone Call  ADMISSION DATE: 08/13/17  DISCHARGE DATE: 08/16/17   How have you been since you were released from the hospital? Not feeling any better having headaches bloating.  Do you understand why you were in the hospital? Yes    Do you understand the discharge instrcutions? Yes    Items Reviewed:  Medications reviewed: Yes   Allergies reviewed: Yes   Dietary changes reviewed: Low sodium,heart healthy,Carbohydrate modified.   Referrals reviewed: Appointment scheduled for hospital follow up   Functional Questionnaire:   Activities of Daily Living (ADLs):   Any patient concerns?  Headaches Bloating, Labs, Renal status.  Confirmed importance and date/time of follow-up visits scheduled:Yes  Confirmed with patient if condition begins to worsen call PCP or go to the ER. Yes   Patient was given the office number and encouragred to call back with questions or concerns.Yes

## 2017-08-20 NOTE — Progress Notes (Addendum)
[place] at [facility] [address] [place], [place] [address] [phone]  Date:  08/21/2017   Name:  Renee James   DOB:  [birth year]   MRN:  [id number]  PCP:  Darreld Mclean, MD    Chief Complaint: Hospitalization Follow-up (pancreatitis, feeling better, still having no energy); Medication Refill (alprazolam refill); and Medication Management ("feeling funny" on the celexa, dizziness, feels "slow minded", out of breath,no energy)   History of Present Illness:  Renee James is a 47 y.o. very pleasant female patient who presents with the following:  Following up from hospital today- I had last seen her in July in the aftermath of her adult son's murder.    She was recently admitted with pancreatitis as follows: Admit date: 08/13/2017 Discharge date: 08/16/2017 Recommendations for Outpatient Follow-up:  1. Follow up with PCP in 1 week 2. Please obtain BMP/CBC in one week to monitor renal function and hemoglobin 3. Alcohol cessation discussed  Discharge Condition: Stable CODE STATUS: Full code Diet recommendation: Heart Healthy / Carb Modified   Brief/Interim Summary: For full details see H&P/Progress note, but in brief, Renee James is a 47 year old female with past medical history significant for diabetes, hypertension, alcohol abuse, asthma/COPD who presented to the emergency department complaining of abdominal pain. Upon ED evaluation CT of the abdomen shows moderate stool burden. And lipase increased to 5311. Patient was admitted with working diagnosis of acute pancreatitis.  Subjective: Patient seen and examined, tolerating diet.  Complaining of abdominal soreness.  No nausea no vomiting.  Had 3 bowel movement absent lactulose given.  No acute events overnight.  Discharge Diagnoses/Hospital Course:  Principal Problem:   Acute pancreatitis Active Problems:   Hyperlipidemia   Essential hypertension   GAD  (generalized anxiety disorder)   CKD (chronic kidney disease) stage 3, GFR 30-59 ml/min (HCC)   Asthma   Depression   Diabetic neuropathy (HCC)   Tobacco use   Type 2 diabetes mellitus (HCC)  Acute pancreatitis Alcohol induced, ultrasound negative for gallbladder disease. Lipase normal. Tolerating diet well.  Patient was treated with supportive treatment, IV fluids, pain and nausea control.  Follow-up with PCP.  Alcohol cessation was discussed AKI on CKD stage II Treated with IV fluids, creatinine back to baseline.  Check BMP in 1 week.  Avoid nephrotoxic agent Alcohol abuse Patient out of window for alcohol withdrawal  Cessation discussed  Hypertension Slight above goal, Continue Losartan and follow up with PCP to monitor BP  Hypokalemia and hypomag  Repleted Check labs in 1-2 weeks  DM type II CBG's stable during hospital stay,resume home medications with no changes Asthma/COPD Stable no signs of exacerbation  All other chronic medical condition were stable during the hospitalization.  On the day of the discharge the patient's vitals were stable, and no other acute medical condition were reported by patient. the patient was felt safe to be discharge to home.   Pt went to the ER with abd pain twice- the first time she was sent home and her lipase was minimally elevated.  Then she went back 36 hours later and she was dx with pancreatitis and admitted Her lipase was 5300 on admission- down to 49 on discharge Asked pt about her alcohol intake- she has been drinking more over the last couple of months, but still not heavy use per her report.  She might intake alcohol 2-3x a week, 2 to 3 dinks.  However she does admit to  drinking a bottle of wine on her own on one occasion which is unusual for her No drinking since she was released to home She has never had pancreatitis in the past   She notes that she is having loose stools/ diarrhea - however this is slowing down. She is still  having a stool after she eats most meals She stopped taking miralax 3 days ago She still has a RUQ pain and feels bloated after eating - however her pain is much reduced  She did vomit prior to her admission- this is now resolved, no further vomiting No fever  She stopped taking celexa after a couple of days due to side effects  She feels that she is doing ok emotionally right now She does need a refill of her alprazolam which she uses prn insomnia  Reviewed he [place] today  Wt Readings from Last 3 Encounters:  08/21/17 190 lb (86.2 kg)  08/13/17 195 lb 1.7 oz (88.5 kg)  08/11/17 195 lb (88.5 kg)    Lab Results  Component Value Date   HGBA1C 6.9 (H) 05/01/2017   For her DM-  She is taking metformin 2000 daily She is taking levemir 25 BID  actos 30 mg   She has not noted any hypoglycemia or significant hyperglycemia since she was sick  Patient Active Problem List   Diagnosis Date Noted  . Acute pancreatitis 08/13/2017  . Asthma 08/13/2017  . Depression 08/13/2017  . Diabetic neuropathy ([place]) 08/13/2017  . Tobacco use 08/13/2017  . Type 2 diabetes mellitus ([place]) 08/13/2017  . 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 Post-operative state 05/09/2017  . Right ovarian cyst 03/07/2017  . Fibroids 03/07/2017  . Abnormal uterine bleeding (AUB) 02/22/2017  . CKD (chronic kidney disease) stage 3, GFR 30-59 ml/min (HCC) 05/16/2016  . Renal insufficiency 05/13/2015  . Ovarian torsion 04/28/2015  . Pain   . Hyperlipidemia 03/19/2015  . Obesity, unspecified 03/19/2015  . Controlled type 2 diabetes mellitus without complication, without long-term current use of insulin ([place]) 03/19/2015  . Essential hypertension 03/19/2015  . GAD (generalized anxiety disorder) 03/19/2015    Past Medical History:  Diagnosis Date  . Allergy   . Anxiety   . Arthritis   . Asthma   . Bell's palsy   . Bell's palsy 2012  . Chronic kidney disease (CKD), stage III (moderate) (HCC)   .  Depression   . Diabetes mellitus without complication ([place])   . Diabetic neuropathy ([place])   . Headache    migraines  . Hyperlipidemia   . Hypertension   . Pneumonia   . Renal insufficiency 05/13/2015    Past Surgical History:  Procedure Laterality Date  . ABDOMINAL HYSTERECTOMY  05/09/2017  . ECTOPIC PREGNANCY SURGERY    . LAPAROSCOPY N/A 04/28/2015   Procedure: LAPAROSCOPY DIAGNOSTIC;  Surgeon: Donnamae Jude, MD;  Location: [facility];  Service: Gynecology;  Laterality: N/A;  . OVARIAN CYST REMOVAL    . ROBOTIC ASSISTED LAPAROSCOPIC HYSTERECTOMY AND SALPINGECTOMY Bilateral 05/09/2017   Procedure: XI ROBOTIC ASSISTED LAPAROSCOPIC HYSTERECTOMY AND Left SALPINGECTOMY, Lysis of Adhesions, cystoscopy;  Surgeon: Lavonia Drafts, MD;  Location: [facility];  Service: Gynecology;  Laterality: Bilateral;  . TUBAL LIGATION      Social History   Tobacco Use  . Smoking status: Current Every Day Smoker    Packs/day: 0.50    Years: 23.00    Pack years: 11.50    Types: Cigarettes  . Smokeless tobacco: Never Used  Substance Use Topics  .  Alcohol use: Yes    Comment: occ  . Drug use: No    Family History  Problem Relation Age of Onset  . Diabetes Mother   . Heart disease Mother   . Kidney failure Mother   . Stroke Father   . Kidney failure Father   . Breast cancer Unknown     Allergies  Allergen Reactions  . Latex Hives, Shortness Of Breath and Itching  . Talc Hives, Shortness Of Breath and Itching  . Ace Inhibitors Cough  . 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  Current Outpatient Medications on File Prior to Visit  Medication Sig Dispense Refill  . albuterol (PROVENTIL HFA;VENTOLIN HFA) 108 (90 Base) MCG/ACT inhaler Inhale 1-2 puffs into the lungs every 6 (six) hours as needed for wheezing or shortness of breath. 1 Inhaler 6  . ALPRAZolam (XANAX) 0.25 MG  tablet Take 1 tablet (0.25 mg total) by mouth at bedtime as needed for anxiety or sleep. 30 tablet 0  . citalopram (CELEXA) 20 MG tablet Take 1 tablet (20 mg total) by mouth daily. 90 tablet 3  . ergocalciferol (VITAMIN D2) 50000 units capsule Take 50,000 Units by mouth every Monday.    . famotidine (PEPCID) 20 MG tablet Take 1 tablet (20 mg total) by mouth 2 (two) times daily. 60 tablet 0  . HYDROcodone-acetaminophen (NORCO/VICODIN) 5-325 MG tablet Take 1 tablet by mouth every 6 (six) hours as needed for up to 5 days for moderate pain. 20 tablet 0  . Insulin Detemir (LEVEMIR) 100 UNIT/ML Pen Inject 10 Units into the skin daily at 10 pm. Check your morning blood sugar every day. If morning blood sugars aren't at goal (100), then increase the amount of evening levemir insulin by 1 unit every night until fasting blood sugars  goal of less than 100 is reached. (Patient taking differently: Inject 25 Units into the skin 2 (two) times daily. Check your morning blood sugar every day. If morning blood sugars aren't at goal (100), then increase the amount of evening levemir insulin by 1 unit every night until fasting blood sugars  goal of less than 100 is reached.) 5 pen PRN  . Lancets (ONETOUCH ULTRASOFT) lancets USE AS DIRECTED TO TEST BLOOD SUGARS DAILY DX: E11.9 100 each 12  . losartan (COZAAR) 100 MG tablet TAKE 1 TABLET BY MOUTH ONCE DAILY (Patient taking differently: Take 100 mg by mouth daily. ) 30 tablet 0  . metFORMIN (GLUCOPHAGE-XR) 500 MG 24 hr tablet Take 4 tablets (2,000 mg total) by mouth daily. 120 tablet 0  . methocarbamol (ROBAXIN) 500 MG tablet Take 1 tablet (500 mg total) by mouth 2 (two) times daily. 14 tablet 0  . pioglitazone (ACTOS) 30 MG tablet Take 30 mg by mouth daily.    . polyethylene glycol (MIRALAX / GLYCOLAX) packet Take 17 g by mouth daily. 14 each 0  . pravastatin (PRAVACHOL) 20 MG tablet Take 20 mg by mouth daily.     . pregabalin (LYRICA) 75 MG capsule Take 75 mg by mouth  daily as needed (for pain).     . torsemide (DEMADEX) 20 MG tablet Take 20 mg by mouth daily.      No current facility-administered medications on file prior to visit.     Review of Systems:  As per HPI- otherwise negative. No SI  No fever or chills  Physical Examination: Vitals:   08/21/17 1129  BP: 128/82  Pulse: 80  Resp: 16  Temp: 99 F (37.2 C)  SpO2: 98%   Vitals:   08/21/17 1129  Weight: 190 lb (86.2 kg)  Height: 5\' 2"  (1.575 m)   Body mass index is 34.75 kg/m. Ideal Body Weight: Weight in (lb) to have BMI = 25: 136.4  GEN: WDWN, NAD, Non-toxic, A & O x 3, obese, looks well  HEENT: Atraumatic, Normocephalic. Neck supple. No masses, No LAD.  Bilateral TM wnl, oropharynx normal.  PEERL,EOMI.   Ears and Nose: No external deformity. CV: RRR, No M/G/R. No JVD. No thrill. No extra heart sounds. PULM: CTA B, no wheezes, crackles, rhonchi. No retractions. No resp. distress. No accessory muscle use. ABD: S, ND, +BS. No rebound. No HSM. She notes mild "soreness" to palpation over her abdomen, most in the RUQ.  However she states much improved  EXTR: No c/c/e NEURO Normal gait.  PSYCH: Normally interactive. Conversant. Not depressed or anxious appearing.  Calm demeanor.    Assessment and Plan: Hospital discharge follow-up  Alcohol-induced acute pancreatitis without infection or necrosis - Plan: CBC, Magnesium, Comprehensive metabolic panel, Lipase, Ambulatory referral to Gastroenterology  Grief at loss of child  Uncontrolled type 2 diabetes mellitus without complication, without long-term current use of insulin ([place]) - Plan: Comprehensive metabolic panel, Hemoglobin A1c  Insomnia, unspecified type - Plan: ALPRAZolam (XANAX) 0.25 MG tablet  7 day hospital follow-up today She was admitted with pancreatitis, thought due to alcohol overuse  She has abstained from alcohol since she was admitted Overall doing better Repeat labs today Referral to Gi as we are not  convinced that her pancreatitis was due to alcohol Check A1c for her today Refilled her xanax   Signed Lamar Blinks, MD  Received her labs 8/21, message to pt  Results for orders placed or performed in visit on 08/21/17  CBC  Result Value Ref Range   WBC 8.3 4.0 - 10.5 K/uL   RBC 4.79 3.87 - 5.11 Mil/uL   Platelets 428.0 (H) 150.0 - 400.0 K/uL   Hemoglobin 13.5 12.0 - 15.0 g/dL   HCT 41.5 36.0 - 46.0 %   MCV 86.6 78.0 - 100.0 fl   MCHC 32.6 30.0 - 36.0 g/dL   RDW 14.2 11.5 - 15.5 %  Magnesium  Result Value Ref Range   Magnesium 1.3 (L) 1.5 - 2.5 mg/dL  Comprehensive metabolic panel  Result Value Ref Range   Sodium 139 135 - 145 mEq/L   Potassium 4.7 3.5 - 5.1 mEq/L   Chloride 101 96 - 112 mEq/L   CO2 27 19 - 32 mEq/L   Glucose, Bld 129 (H) 70 - 99 mg/dL   BUN 19 6 - 23 mg/dL   Creatinine, Ser 1.63 (H) 0.40 - 1.20 mg/dL   Total Bilirubin 0.3 0.2 - 1.2 mg/dL   Alkaline Phosphatase 79 39 - 117 U/L   AST 12 0 - 37 U/L   ALT 10 0 - 35 U/L   Total Protein 7.1 6.0 - 8.3 g/dL   Albumin 4.0 3.5 - 5.2 g/dL   Calcium 9.9 8.4 - 10.5 mg/dL   GFR 43.11 (L) >60.00 mL/min  Hemoglobin A1c  Result Value Ref Range   Hgb A1c MFr Bld 7.1 (H) 4.6 - 6.5 %  Lipase  Result Value Ref Range   Lipase 58.0 11.0 - 59.0 U/L   Your blood counts look good. Platelet count is slightly high, we will follow  this.   Your magnesium level is a bit low.  However, as your kidneys are also borderline I do not want to give you magnesium replacement out of the hospital setting.  Magnesium replacement needs to be used with extreme caution and frequent monitoring if your kidneys are not 100%. Let's have you work on increasing dietary magnesium instead.  If you like, here is a link to a list of high magnesium foods: https://my.SunglassSpecialist.gl  In general rich sources of magnesium are greens, nuts, seeds, dry beans, whole grains and low-fat dairy products.women is  310-320 mg per day.  Speaking of your kidneys, I don't think you are seeing a kidney doctor (nephrologist) as of yet.  I am going to make a referral for you to establish care. At this time they will likely just monitor you.  Your kidney function has been fluctuating, but at this last lab check it was a bit worse than is normal for you.  If your kidney function is below a certain level we can't use metformin due to risk of toxicity.  Please stop your metformin just for now, and we will recheck your kidney level next week (lab visit only) to see if you can restart.  Your A1c is ok at the moment.    Lipase- pancreatic enzyme test- is normal.    In summary, please work on high magnesium foods and watch your carbs and sugars as we are temporarily holding metformin.  Please come in for a lab visit only to check your kidney function next Monday or Tuesday.  I am going to set you up to see nephrology for a consultation.

## 2017-08-21 ENCOUNTER — Ambulatory Visit: Payer: Managed Care, Other (non HMO) | Admitting: Family Medicine

## 2017-08-21 ENCOUNTER — Encounter: Payer: Self-pay | Admitting: Gastroenterology

## 2017-08-21 ENCOUNTER — Encounter: Payer: Self-pay | Admitting: Family Medicine

## 2017-08-21 VITALS — BP 128/82 | HR 80 | Temp 99.0°F | Resp 16 | Ht 62.0 in | Wt 190.0 lb

## 2017-08-21 DIAGNOSIS — F4321 Adjustment disorder with depressed mood: Secondary | ICD-10-CM | POA: Diagnosis not present

## 2017-08-21 DIAGNOSIS — K852 Alcohol induced acute pancreatitis without necrosis or infection: Secondary | ICD-10-CM

## 2017-08-21 DIAGNOSIS — E1165 Type 2 diabetes mellitus with hyperglycemia: Secondary | ICD-10-CM

## 2017-08-21 DIAGNOSIS — Z09 Encounter for follow-up examination after completed treatment for conditions other than malignant neoplasm: Secondary | ICD-10-CM

## 2017-08-21 DIAGNOSIS — Z634 Disappearance and death of family member: Secondary | ICD-10-CM

## 2017-08-21 DIAGNOSIS — IMO0001 Reserved for inherently not codable concepts without codable children: Secondary | ICD-10-CM

## 2017-08-21 DIAGNOSIS — N183 Chronic kidney disease, stage 3 unspecified: Secondary | ICD-10-CM

## 2017-08-21 DIAGNOSIS — G4700 Insomnia, unspecified: Secondary | ICD-10-CM

## 2017-08-21 LAB — CBC
HCT: 41.5 % (ref 36.0–46.0)
Hemoglobin: 13.5 g/dL (ref 12.0–15.0)
MCHC: 32.6 g/dL (ref 30.0–36.0)
MCV: 86.6 fl (ref 78.0–100.0)
Platelets: 428 10*3/uL — ABNORMAL HIGH (ref 150.0–400.0)
RBC: 4.79 Mil/uL (ref 3.87–5.11)
RDW: 14.2 % (ref 11.5–15.5)
WBC: 8.3 10*3/uL (ref 4.0–10.5)

## 2017-08-21 LAB — COMPREHENSIVE METABOLIC PANEL
ALK PHOS: 79 U/L (ref 39–117)
ALT: 10 U/L (ref 0–35)
AST: 12 U/L (ref 0–37)
Albumin: 4 g/dL (ref 3.5–5.2)
BUN: 19 mg/dL (ref 6–23)
CO2: 27 mEq/L (ref 19–32)
Calcium: 9.9 mg/dL (ref 8.4–10.5)
Chloride: 101 mEq/L (ref 96–112)
Creatinine, Ser: 1.63 mg/dL — ABNORMAL HIGH (ref 0.40–1.20)
GFR: 43.11 mL/min — ABNORMAL LOW (ref >60.00)
GLUCOSE: 129 mg/dL — AB (ref 70–99)
POTASSIUM: 4.7 meq/L (ref 3.5–5.1)
SODIUM: 139 meq/L (ref 135–145)
TOTAL PROTEIN: 7.1 g/dL (ref 6.0–8.3)
Total Bilirubin: 0.3 mg/dL (ref 0.2–1.2)

## 2017-08-21 LAB — LIPASE: Lipase: 58 U/L (ref 11.0–59.0)

## 2017-08-21 LAB — MAGNESIUM: MAGNESIUM: 1.3 mg/dL — AB (ref 1.5–2.5)

## 2017-08-21 LAB — HEMOGLOBIN A1C: Hgb A1c MFr Bld: 7.1 % — ABNORMAL HIGH (ref 4.6–6.5)

## 2017-08-21 MED ORDER — ALPRAZOLAM 0.25 MG PO TABS: 0.2500 mg | ORAL_TABLET | Freq: Every evening | ORAL | 0 refills | Status: AC | PRN

## 2017-08-21 NOTE — Patient Instructions (Addendum)
It was good to see you today! Take care and I will be in touch with your labs asap If your lipase or liver tests are up I will send you for an ultrasound of your gallbladder. Assuming all is well, we will plan to have you see GI to look at your pancreatitis a bit further We are not certain that alcohol was the cause of your pancreatitis. However, please abstain from alcohol for now and use alcohol very cautiously if you do go back to it in the future.  We will also check your A1c today- I'll be in touch with this asap  Take care, try to get plenty of rest!  Let me know if you are not doing ok  I refilled your alprazolam- do not use with any other sedating medication or alcohol please

## 2017-08-23 ENCOUNTER — Encounter: Payer: Self-pay | Admitting: Family Medicine

## 2017-08-23 NOTE — Addendum Note (Signed)
Addended by: Lamar Blinks C on: 08/23/2017 06:07 AM   Modules accepted: Orders

## 2017-08-28 ENCOUNTER — Telehealth: Payer: Self-pay | Admitting: Family Medicine

## 2017-08-28 ENCOUNTER — Other Ambulatory Visit (INDEPENDENT_AMBULATORY_CARE_PROVIDER_SITE_OTHER): Payer: Managed Care, Other (non HMO)

## 2017-08-28 ENCOUNTER — Encounter: Payer: Self-pay | Admitting: Family Medicine

## 2017-08-28 DIAGNOSIS — E1165 Type 2 diabetes mellitus with hyperglycemia: Secondary | ICD-10-CM | POA: Diagnosis not present

## 2017-08-28 DIAGNOSIS — N183 Chronic kidney disease, stage 3 unspecified: Secondary | ICD-10-CM

## 2017-08-28 DIAGNOSIS — IMO0001 Reserved for inherently not codable concepts without codable children: Secondary | ICD-10-CM

## 2017-08-28 LAB — BASIC METABOLIC PANEL
BUN: 18 mg/dL (ref 6–23)
CALCIUM: 9.4 mg/dL (ref 8.4–10.5)
CO2: 23 meq/L (ref 19–32)
Chloride: 102 mEq/L (ref 96–112)
Creatinine, Ser: 1.37 mg/dL — ABNORMAL HIGH (ref 0.40–1.20)
GFR: 52.68 mL/min — AB (ref >60.00)
GLUCOSE: 277 mg/dL — AB (ref 70–99)
POTASSIUM: 3.8 meq/L (ref 3.5–5.1)
SODIUM: 136 meq/L (ref 135–145)

## 2017-08-28 LAB — MAGNESIUM: MAGNESIUM: 1.7 mg/dL (ref 1.5–2.5)

## 2017-08-28 NOTE — Telephone Encounter (Signed)
Copied from [place] [id number]. Topic: Quick Communication - See Telephone Encounter >> Aug 28, 2017  2:52 PM Gardiner Ramus wrote: CRM for notification. See Telephone encounter for: 08/28/17.Renee James is a Armed forces operational officer for [employer] and will be working with the patient. Melissa just wanted to make sure we had her number in case we needed anything from her.

## 2017-08-28 NOTE — Telephone Encounter (Signed)
Copied from [place] [id number]. Topic: Quick Communication - See Telephone Encounter >> Aug 28, 2017  2:52 PM Gardiner Ramus wrote: CRM for notification. See Telephone encounter for: 08/28/17.Lenna Sciara is a Armed forces operational officer for [employer] and will be working with the patient. Melissa just wanted to make sure we had her number in case we needed anything from her.

## 2017-09-18 ENCOUNTER — Ambulatory Visit (INDEPENDENT_AMBULATORY_CARE_PROVIDER_SITE_OTHER): Payer: Managed Care, Other (non HMO) | Admitting: Gastroenterology

## 2017-09-18 ENCOUNTER — Encounter: Payer: Self-pay | Admitting: Gastroenterology

## 2017-09-18 VITALS — BP 132/86 | HR 70 | Ht 63.0 in | Wt 196.0 lb

## 2017-09-18 DIAGNOSIS — K859 Acute pancreatitis without necrosis or infection, unspecified: Secondary | ICD-10-CM | POA: Diagnosis not present

## 2017-09-18 DIAGNOSIS — Z1211 Encounter for screening for malignant neoplasm of colon: Secondary | ICD-10-CM | POA: Diagnosis not present

## 2017-09-18 DIAGNOSIS — F17200 Nicotine dependence, unspecified, uncomplicated: Secondary | ICD-10-CM

## 2017-09-18 DIAGNOSIS — Z1212 Encounter for screening for malignant neoplasm of rectum: Secondary | ICD-10-CM | POA: Diagnosis not present

## 2017-09-18 NOTE — Patient Instructions (Addendum)
If you are age 49 or older, your body mass index should be between 23-30. Your Body mass index is 34.72 kg/m. If this is out of the aforementioned range listed, please consider follow up with your Primary Care Provider.  If you are age 30 or younger, your body mass index should be between 19-25. Your Body mass index is 34.72 kg/m. If this is out of the aformentioned range listed, please consider follow up with your Primary Care Provider.   Call us back at [phone] to schedule your Colonoscopy at a later date.  It was a pleasure to see you today!  Vito Cirigliano, D.O.

## 2017-09-18 NOTE — Progress Notes (Signed)
Chief Complaint: Recent admission for acute pancreatitis   Referring Provider:     Lamar Blinks, MD    HPI:     Kahlyn Shippey is a 47 y.o. female referred to the [facility] for follow-up of recent admission for acute pancreatitis.  She was admitted 8/11-14 for acute pancreatitis, with admission lipase 5311. Was acutally seen in the ER on 8/9, with normal lipase at that time and RUQ [place] that only demonstrated fatty liver. CT on 8/9 also with hepatic steatosis with o/w normal appearing pancreas and no duct dilation.  Normal Tbili and liver enzymes on admission.  Normal lipid panel, normal serum calcium.  No repeat imaging studies.  She was treated with IV fluids, n.p.o., pain medications with swift clinical response, and discharged home on 08/16/2017 with a working diagnosis of EtOH induced pancreatitis.  She states, however, that her last drink was 1 week prior to admission, and she tends to only have a couple drinks a few times per week.   Since discharge she has tolerated all p.o. intake, and aside from some mild epigastric discomfort, no pain, nausea, vomiting, fever, chills.  She is otherwise largely back to her baseline state of health.  She was seen by her PCM, Dr. Edilia Bo, on 8/19 with repeat labs notable for normal CBC, normal liver enzymes, lipase 58 and hemoglobin A1c 7.1%.  No previous similar sxs. Sxs have not recurred.  No known family or personal history of liver disease, pancreatic disease, biliary disease, GI malignancy, CRC.   Past Medical History:  Diagnosis Date  . Allergy   . Anxiety   . Arthritis   . Asthma   . Bell's palsy   . Bell's palsy 2012  . Chronic kidney disease (CKD), stage III (moderate) (HCC)   . Depression   . Diabetes mellitus without complication ([place])   . Diabetic neuropathy ([place])   . Headache    migraines  . Hyperlipidemia   . Hypertension   . Pneumonia   . Renal insufficiency 05/13/2015     Past Surgical  History:  Procedure Laterality Date  . ABDOMINAL HYSTERECTOMY  05/09/2017  . ECTOPIC PREGNANCY SURGERY    . LAPAROSCOPY N/A 04/28/2015   Procedure: LAPAROSCOPY DIAGNOSTIC;  Surgeon: Donnamae Jude, MD;  Location: [facility];  Service: Gynecology;  Laterality: N/A;  . OVARIAN CYST REMOVAL    . ROBOTIC ASSISTED LAPAROSCOPIC HYSTERECTOMY AND SALPINGECTOMY Bilateral 05/09/2017   Procedure: XI ROBOTIC ASSISTED LAPAROSCOPIC HYSTERECTOMY AND Left SALPINGECTOMY, Lysis of Adhesions, cystoscopy;  Surgeon: Lavonia Drafts, MD;  Location: [facility];  Service: Gynecology;  Laterality: Bilateral;  . TUBAL LIGATION     Family History  Problem Relation Age of Onset  . Diabetes Mother   . Heart disease Mother   . Kidney failure Mother   . Stroke Father   . Kidney failure Father   . Breast cancer Unknown    Social History   Tobacco Use  . Smoking status: Current Every Day Smoker    Packs/day: 0.50    Years: 23.00    Pack years: 11.50    Types: Cigarettes  . Smokeless tobacco: Never Used  Substance Use Topics  . Alcohol use: Yes    Comment: occ  . Drug use: No   Current Outpatient Medications  Medication Sig Dispense Refill  . albuterol (PROVENTIL HFA;VENTOLIN HFA) 108 (90 Base) MCG/ACT inhaler Inhale 1-2 puffs into the lungs every 6 (six) hours as  needed for wheezing or shortness of breath. 1 Inhaler 6  . ALPRAZolam (XANAX) 0.25 MG tablet Take 1 tablet (0.25 mg total) by mouth at bedtime as needed for anxiety or sleep. 30 tablet 0  . citalopram (CELEXA) 20 MG tablet Take 1 tablet (20 mg total) by mouth daily. 90 tablet 3  . ergocalciferol (VITAMIN D2) 50000 units capsule Take 50,000 Units by mouth every Monday.    . famotidine (PEPCID) 20 MG tablet Take 1 tablet (20 mg total) by mouth 2 (two) times daily. 60 tablet 0  . Insulin Detemir (LEVEMIR) 100 UNIT/ML Pen Inject 10 Units into the skin daily at 10 pm. Check your morning blood sugar every day. If morning blood sugars aren't at goal (100),  then increase the amount of evening levemir insulin by 1 unit every night until fasting blood sugars  goal of less than 100 is reached. (Patient taking differently: Inject 25 Units into the skin 2 (two) times daily. Check your morning blood sugar every day. If morning blood sugars aren't at goal (100), then increase the amount of evening levemir insulin by 1 unit every night until fasting blood sugars  goal of less than 100 is reached.) 5 pen PRN  . Lancets (ONETOUCH ULTRASOFT) lancets USE AS DIRECTED TO TEST BLOOD SUGARS DAILY DX: E11.9 100 each 12  . losartan (COZAAR) 100 MG tablet TAKE 1 TABLET BY MOUTH ONCE DAILY (Patient taking differently: Take 100 mg by mouth daily. ) 30 tablet 0  . metFORMIN (GLUCOPHAGE-XR) 500 MG 24 hr tablet Take 4 tablets (2,000 mg total) by mouth daily. 120 tablet 0  . methocarbamol (ROBAXIN) 500 MG tablet Take 1 tablet (500 mg total) by mouth 2 (two) times daily. 14 tablet 0  . pioglitazone (ACTOS) 30 MG tablet Take 30 mg by mouth daily.    . polyethylene glycol (MIRALAX / GLYCOLAX) packet Take 17 g by mouth daily. 14 each 0  . pravastatin (PRAVACHOL) 20 MG tablet Take 20 mg by mouth daily.     . pregabalin (LYRICA) 75 MG capsule Take 75 mg by mouth daily as needed (for pain).     . torsemide (DEMADEX) 20 MG tablet Take 20 mg by mouth daily.      No current facility-administered medications for this visit.    Allergies  Allergen Reactions  . Latex Hives, Shortness Of Breath and Itching  . Talc Hives, Shortness Of Breath and Itching  . Ace Inhibitors Cough  . 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     Physical Exam:    Wt Readings from Last 3 Encounters:  09/18/17 196 lb (88.9 kg)  08/21/17 190 lb (86.2 kg)  08/13/17 195 lb 1.7 oz (88.5 kg)    BP 132/86   Pulse 70   Ht '5\' 3"'$  (1.6 m)   Wt 196 lb (88.9  kg)   LMP 05/09/2017   BMI 34.72 kg/m  Constitutional:  Pleasant, in no acute distress. Psychiatric: Normal mood and affect. Behavior is normal. EENT: Pupils normal.  Conjunctivae are normal. No scleral icterus. Neck supple. No cervical LAD. Cardiovascular: Normal rate, regular rhythm. No edema Pulmonary/chest: Effort normal and breath sounds normal. No wheezing, rales or rhonchi. Abdominal: Soft, nondistended, nontender. Bowel sounds active throughout. There are no masses palpable. No hepatomegaly. Neurological: Alert and oriented to person place  and time. Skin: Skin is warm and dry. No rashes noted.   ASSESSMENT AND PLAN;   Addysyn Fern is a 47 y.o. female presenting with:  1) Acute Pancreatitis:  Recent admission for uncomplicated acute pancreatitis with idiopathic etiology.  No gallstones noted on ultrasound.  Medication list reviewed and without any typical inciting agents.  Discussed potential etiologies for acute pancreatitis as well as sequelae of pancreatitis, both acute and chronic.  Given resolution of pancreatitis and no family history of pancreaticobiliary disease, no further work-up indicated at this time.  However, if she has recurrence of pancreatitis, will likely further evaluate with EUS and/or MRCP.  2) CRC Screening No previous CRC screening. No family history of CRC or related malignancies, and patient is without any active GI sxs. Discussed options for CRC screening, to include optical vs virtual colonoscopy - the risks and benefits and pros and cons of each, as well as discussion of FIT kit testing, Cologuard, etc, and the patient decided to proceed with an optical colonoscopy.   - Will schedule date and time for colonoscopy prior to leaving clinic today  - NPO at MN prior to procedure  - Bowel prep ordered with plan for instruction with GI clinical staff - All questions answered  The indications, risks, and benefits of colonoscopy were explained to the patient in  detail. Risks include but are not limited to bleeding, perforation, adverse reaction to medications, and cardiopulmonary compromise. Sequelae include but are not limited to the possibility of surgery, hospitalization, and mortality. The patient verbalized understanding and wished to proceed. All questions answered, referred for scheduling and bowel prep ordered. Further recommendations pending results of the exam.    3) Tobacco use: Discussed smoking cessation at length and counseled on the risks of continued smoking, to include cardiovascular, pulmonary, risk of chronic pancreatitis/pancreaticobiliary cancer, GI malignancy.  Currently smoking half pack per day.  Does not currently seem interested in completely stopping.  Will will review again on follow-up.   Lavena Bullion, DO, FACG  09/18/2017, 1:54 PM   [place], Gay Filler, MD

## 2017-10-09 ENCOUNTER — Telehealth: Payer: Self-pay

## 2017-10-09 NOTE — Telephone Encounter (Signed)
Copied from [place] [id number]. Topic: General - Other >> Sep 28, 2017  4:41 PM Valla Leaver wrote: Reason for CRM: [employer] Nurse Case Manager, Lenna Sciara, calling to inquire about gaps in care like medications for the patient. Please call back to assist.

## 2017-10-09 NOTE — Telephone Encounter (Signed)
No number given to give a call back.

## 2017-11-08 ENCOUNTER — Other Ambulatory Visit: Payer: Self-pay | Admitting: Physician Assistant

## 2017-11-08 DIAGNOSIS — M47819 Spondylosis without myelopathy or radiculopathy, site unspecified: Secondary | ICD-10-CM

## 2017-11-15 ENCOUNTER — Telehealth: Payer: Self-pay | Admitting: Family Medicine

## 2017-11-15 DIAGNOSIS — G8929 Other chronic pain: Secondary | ICD-10-CM

## 2017-11-15 DIAGNOSIS — M79643 Pain in unspecified hand: Principal | ICD-10-CM

## 2017-11-15 MED ORDER — TRAMADOL HCL 50 MG PO TABS: 50.0000 mg | ORAL_TABLET | Freq: Two times a day (BID) | ORAL | 1 refills | Status: DC | PRN

## 2017-11-15 NOTE — Telephone Encounter (Signed)
traMADol (ULTRAM) 50 MG tablet ; Not on current med profile.  Patient called to request refill of Tramadol. LRF was 05/10/17.  Pt states the arthritis pain in her hands is "Acting up because of the cold."   If appropriate:  [place] [place]    [phone] (Phone) [phone] (Fax)

## 2017-11-15 NOTE — Telephone Encounter (Signed)
NCCSR:  Reviewed  10/11/2017  3   06/07/2017  Pregabalin 75 Mg Capsule  30.00 30 Dh Pat  [id number]  Wal ([id number])  0/3 0.50 LME Comm Ins  [place]  08/21/2017  2   08/21/2017  Alprazolam 0.25 Mg Tablet  30.00 30 Je Cop  [id number]  Wal (1224)  0/0 0.50 LME Comm Ins  [place]  08/16/2017  2   08/16/2017  Hydrocodone-Acetamin 5-325 Mg  20.00 5 Ed Sil  [id number]  Wal (1224)  0/0 20.00 MME Comm Ins  [place]  08/11/2017  1   08/11/2017  Hydrocodone-Acetamin 5-325 Mg  8.00 2 Al Mur  [id number]  Med (5269)  0/0 20.00 MME Comm Ins  [place]  08/07/2017  2   06/07/2017  Pregabalin 75 Mg Capsule  30.00 30 Dh Pat  [id number]  Wal (1224)  1/5 0.50 LME Comm Ins  [place]  06/14/2017  1   06/14/2017  Alprazolam 0.25 Mg Tablet  30.00 30 Je Cop  [id number]  Wal (1224)  0/0 0.50 LME Comm Ins  [place]  06/07/2017  1   06/07/2017  Lyrica 75 Mg Capsule  30.00 30 Dh Pat  [id number]  Wal (1224)  0/5 0.50 LME Comm Ins  [place]  05/16/2017  1   05/16/2017  Oxycodone-Acetaminophen 5-325  20.00 4 Ca Har  [id number]  Wal (1224)  0/0 37.50 MME Comm Ins  [place]  05/10/2017  1   05/10/2017  Oxycodone-Acetaminophen 5-325  20.00 3 Ca Har  [id number]  Wal (1224)  0/0 50.00 MME Comm Ins  [place]  03/16/2017  2   03/15/2017  Tramadol Hcl 50 Mg Tablet  12.00 3 Bo Tra  [id number]  Wal (5027)  0/0      Ok to refill

## 2017-11-15 NOTE — Telephone Encounter (Signed)
Copied from [place] [id number]. Topic: Quick Communication - Rx Refill/Question >> Nov 15, 2017  1:15 PM Leward Quan A wrote: Medication: traMADol (ULTRAM) 50 MG tablet   Patient called to request refill said it have not been filled for a while but the Arthritis pain in hands and back is acting up because of the cold.  Has the patient contacted their pharmacy? No.   Preferred Pharmacy (with phone number or street name): [facility] [phone] (Phone) [phone] (Fax)    Agent: Please be advised that RX refills may take up to 3 business days. We ask that you follow-up with your pharmacy.

## 2017-11-16 ENCOUNTER — Other Ambulatory Visit: Payer: Self-pay | Admitting: Family Medicine

## 2017-11-16 DIAGNOSIS — M79643 Pain in unspecified hand: Principal | ICD-10-CM

## 2017-11-16 DIAGNOSIS — G8929 Other chronic pain: Secondary | ICD-10-CM

## 2017-11-16 MED ORDER — TRAMADOL HCL 50 MG PO TABS: 50.0000 mg | ORAL_TABLET | Freq: Two times a day (BID) | ORAL | 1 refills | Status: DC | PRN

## 2017-12-15 ENCOUNTER — Ambulatory Visit: Payer: Self-pay

## 2017-12-15 ENCOUNTER — Encounter: Payer: Self-pay | Admitting: Family Medicine

## 2017-12-15 MED ORDER — TORSEMIDE 20 MG PO TABS: 10.0000 mg | ORAL_TABLET | Freq: Every day | ORAL | 6 refills | Status: DC

## 2017-12-15 NOTE — Telephone Encounter (Signed)
Pt called asking if Dr. Lorelei Pont would prescribe pt enough torsemide to get to Mondays appointment. Pt's last refill was 12/06/17 #5. Pt stated that she cuts pills in half because if she takes a whole one, she will go to the bathroom and void all night. Advised pt that Dr Lorelei Pont may not fill a med prescribed by another provider. Pt stated the last time she called the nephrologist was then end of November. Advised pt to call Dr Judithann Sauger office this morning to get a refill. Routing back to PCP office to review.  Reason for Disposition . [1] Request for URGENT new prescription or refill of "essential" medication (i.e., likelihood of harm to patient if not taken) AND [2] triager unable to fill per unit policy  Answer Assessment - Initial Assessment Questions 1. SYMPTOMS: "Do you have any symptoms?"     no 2. SEVERITY: If symptoms are present, ask "Are they mild, moderate or severe?"     N/a  Pt called asking if Dr. Lorelei Pont would prescribe pt enough torsemide to get to Mondays appointment. Pt's last refill was 12/06/17 #5. Pt stated that she cuts pills in half because if she takes a whole one, she will go to the bathroom and void all night. Advised pt that Dr Lorelei Pont may not fill a med prescribed by another provider. Pt stated the last time she called the nephrologist was then end of November. Advised pt to call Dr Judithann Sauger office this morning to get a refill. Routing back to PCP office to review.  Protocols used: MEDICATION QUESTION CALL-A-AH

## 2017-12-15 NOTE — Addendum Note (Signed)
Addended by: Lamar Blinks C on: 12/15/2017 03:33 PM   Modules accepted: Orders

## 2017-12-16 NOTE — Progress Notes (Addendum)
[place] [facility] [address], [place], [place] [address] [phone]  Date:  12/18/2017   Name:  Renee James   DOB:  1970/03/20   MRN:  [id number]  PCP:  Darreld Mclean, MD    Chief Complaint: Back Pain and Neck Pain   History of Present Illness:  Renee James is a 47 y.o. very pleasant female patient who presents with the following:  History of diabetes mellitus with associated nephropathy, hyperlipidemia, obesity, hypertension. Last seen here in October following hospital admission for pancreatitis I had placed referral to nephrology at that time.  We had to stop her metformin temporarily, but her kidneys did recover and we were able to restart metformin.  However she is not taking this now as below She was seen by [facility] in September, and I have reviewed their note. Lab Results  Component Value Date   HGBA1C 7.1 (H) 08/21/2017   Foot exam due today Eye exam: 1/19 Flu shot:  delcines  BP Readings from Last 3 Encounters:  12/18/17 132/80  09/18/17 132/86  08/21/17 128/82   She was given torsemide by her nephrologist, but was running low and had to start lowering her dose/ spacing out her pills She has been out for about a week- I refilled on Friday but she was not aware   Her weight has gone up, as below She is wearing compression socks.  Her legs do not look swollen at all but she feels like she is retaining fluid and notes that her feet do swell if she does not wear her socks No orthopnea noted   Wt Readings from Last 3 Encounters:  12/18/17 210 lb 4 oz (95.4 kg)  09/18/17 196 lb (88.9 kg)  08/21/17 190 lb (86.2 kg)   She also notes a pain in her right flank and RLQ that was present when she awoke last Thursday- today is Monday. The abd pain went away, but she continues to have some pain in her right trapezius and right lower back  Lower back pain has been present for a very long time.    She has noted this right upper back/ shoulder tightness for 2-3 months She was given robaxin over the summer for a pulled muscle but does not remember if she ever used it She uses xanax 3-4x a week She takes lyrica daily and tramadol daily  She is being referred to pain management for her lower back pain and spinal stenosis- I had her see neurosurgery but no surgery is indicated that this time  She is not taking metformin, just actos and insulin Decided to stop metformin after seeing negative things about it online No fever No nausea or vomiting Notes decreased appetite Has complaint of constipation  No urinary sx except she feels like she cannot urinate without torsemide   S/p hyst  Patient Active Problem List   Diagnosis Date Noted  . Acute pancreatitis 08/13/2017  . Asthma 08/13/2017  . Depression 08/13/2017  . Diabetic neuropathy ([place]) 08/13/2017  . Tobacco use 08/13/2017  . Type 2 diabetes mellitus ([place]) 08/13/2017  . 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 Post-operative state 05/09/2017  . Right ovarian cyst 03/07/2017  . Fibroids 03/07/2017  . Abnormal uterine bleeding (AUB) 02/22/2017  . CKD (chronic kidney disease) stage 3, GFR 30-59 ml/min (HCC) 05/16/2016  . Renal insufficiency 05/13/2015  . Ovarian torsion 04/28/2015  . Pain   . Hyperlipidemia 03/19/2015  .  Obesity, unspecified 03/19/2015  . Uncontrolled type 2 diabetes mellitus without complication, without long-term current use of insulin ([place]) 03/19/2015  . Essential hypertension 03/19/2015  . GAD (generalized anxiety disorder) 03/19/2015    Past Medical History:  Diagnosis Date  . Allergy   . Anxiety   . Arthritis   . Asthma   . Bell's palsy   . Bell's palsy 2012  . Chronic kidney disease (CKD), stage III (moderate) (HCC)   . Depression   . Diabetes mellitus without complication ([place])   . Diabetic neuropathy ([place])   . Headache    migraines  . Hyperlipidemia   . Hypertension   .  Pneumonia   . Renal insufficiency 05/13/2015    Past Surgical History:  Procedure Laterality Date  . ABDOMINAL HYSTERECTOMY  05/09/2017  . ECTOPIC PREGNANCY SURGERY    . LAPAROSCOPY N/A 04/28/2015   Procedure: LAPAROSCOPY DIAGNOSTIC;  Surgeon: Donnamae Jude, MD;  Location: [facility];  Service: Gynecology;  Laterality: N/A;  . OVARIAN CYST REMOVAL    . ROBOTIC ASSISTED LAPAROSCOPIC HYSTERECTOMY AND SALPINGECTOMY Bilateral 05/09/2017   Procedure: XI ROBOTIC ASSISTED LAPAROSCOPIC HYSTERECTOMY AND Left SALPINGECTOMY, Lysis of Adhesions, cystoscopy;  Surgeon: Lavonia Drafts, MD;  Location: [facility];  Service: Gynecology;  Laterality: Bilateral;  . TUBAL LIGATION      Social History   Tobacco Use  . Smoking status: Current Every Day Smoker    Packs/day: 0.50    Years: 23.00    Pack years: 11.50    Types: Cigarettes  . Smokeless tobacco: Never Used  Substance Use Topics  . Alcohol use: Yes    Comment: occ  . Drug use: No    Family History  Problem Relation Age of Onset  . Diabetes Mother   . Heart disease Mother   . Kidney failure Mother   . Stroke Father   . Kidney failure Father   . Breast cancer Unknown     Allergies  Allergen Reactions  . Latex Hives, Shortness Of Breath and Itching  . Talc Hives, Shortness Of Breath and Itching  . Ace Inhibitors Cough  . 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  Current Outpatient Medications on File Prior to Visit  Medication Sig Dispense Refill  . albuterol (PROVENTIL HFA;VENTOLIN HFA) 108 (90 Base) MCG/ACT inhaler Inhale 1-2 puffs into the lungs every 6 (six) hours as needed for wheezing or shortness of breath. 1 Inhaler 6  . ALPRAZolam (XANAX) 0.25 MG tablet Take 1 tablet (0.25 mg total) by mouth at bedtime as needed for anxiety or sleep. 30 tablet 0  . citalopram (CELEXA) 20 MG tablet Take 1 tablet (20 mg total)  by mouth daily. 90 tablet 3  . ergocalciferol (VITAMIN D2) 50000 units capsule Take 50,000 Units by mouth every Monday.    . famotidine (PEPCID) 20 MG tablet Take 1 tablet (20 mg total) by mouth 2 (two) times daily. 60 tablet 0  . Insulin Detemir (LEVEMIR) 100 UNIT/ML Pen Inject 10 Units into the skin daily at 10 pm. Check your morning blood sugar every day. If morning blood sugars aren't at goal (100), then increase the amount of evening levemir insulin by 1 unit every night until fasting blood sugars  goal of less than 100 is reached. (Patient taking differently: Inject 25 Units into the skin 2 (two) times daily. Check your morning blood sugar  every day. If morning blood sugars aren't at goal (100), then increase the amount of evening levemir insulin by 1 unit every night until fasting blood sugars  goal of less than 100 is reached.) 5 pen PRN  . Lancets (ONETOUCH ULTRASOFT) lancets USE AS DIRECTED TO TEST BLOOD SUGARS DAILY DX: E11.9 100 each 12  . losartan (COZAAR) 100 MG tablet TAKE 1 TABLET BY MOUTH ONCE DAILY (Patient taking differently: Take 100 mg by mouth daily. ) 30 tablet 0  . metFORMIN (GLUCOPHAGE-XR) 500 MG 24 hr tablet Take 4 tablets (2,000 mg total) by mouth daily. 120 tablet 0  . methocarbamol (ROBAXIN) 500 MG tablet Take 1 tablet (500 mg total) by mouth 2 (two) times daily. 14 tablet 0  . pioglitazone (ACTOS) 30 MG tablet Take 30 mg by mouth daily.    . polyethylene glycol (MIRALAX / GLYCOLAX) packet Take 17 g by mouth daily. 14 each 0  . pravastatin (PRAVACHOL) 20 MG tablet Take 20 mg by mouth daily.     . pregabalin (LYRICA) 75 MG capsule Take 75 mg by mouth daily as needed (for pain).     . torsemide (DEMADEX) 20 MG tablet Take 0.5-1 tablets (10-20 mg total) by mouth daily. 30 tablet 6  . traMADol (ULTRAM) 50 MG tablet Take 1 tablet (50 mg total) by mouth every 12 (twelve) hours as needed. For arthritis pain. Can cause sedation 30 tablet 1   No current facility-administered  medications on file prior to visit.     Review of Systems:  As per HPI- otherwise negative.   Physical Examination: Vitals:   12/18/17 1407  BP: 132/80  Pulse: 78  Resp: 16  Temp: 98.4 F (36.9 C)  SpO2: 98%   Vitals:   12/18/17 1407  Weight: 210 lb 4 oz (95.4 kg)  Height: 5\' 3"  (1.6 m)   Body mass index is 37.24 kg/m. Ideal Body Weight: Weight in (lb) to have BMI = 25: 140.8  GEN: WDWN, NAD, Non-toxic, A & O x 3, looks well, obese  HEENT: Atraumatic, Normocephalic. Neck supple. No masses, No LAD. Ears and Nose: No external deformity. CV: RRR, No M/G/R. No JVD. No thrill. No extra heart sounds. PULM: CTA B, no wheezes, crackles, rhonchi. No retractions. No resp. distress. No accessory muscle use. ABD: S,  ND, +BS. No rebound. No HSM.  She indicates mild TTP across her bilateral lower abd, but no acute abdomen. She indicates tenderness in the right trapezius muscle.  There is some spasm in this area.  She also indicates the midline lower back at the lumbosacral juncture as the location of her chronic back pain. EXTR: No c/c/e NEURO Normal gait.  PSYCH: Normally interactive. Conversant. Not depressed or anxious appearing.  Calm demeanor.   Results for orders placed or performed in visit on 12/18/17  POCT urinalysis dipstick  Result Value Ref Range   Color, UA yellow yellow   Clarity, UA cloudy (A) clear   Glucose, UA =100 (A) negative mg/dL   Bilirubin, UA negative negative   Ketones, POC UA negative negative mg/dL   Spec Grav, UA >=1.030 (A) 1.010 - 1.025   Blood, UA negative negative   pH, UA 6.0 5.0 - 8.0   Protein Ur, POC trace (A) negative mg/dL   Urobilinogen, UA 1.0 0.2 or 1.0 E.U./dL   Nitrite, UA Negative Negative   Leukocytes, UA Negative Negative     Assessment and Plan: Uncontrolled type 2 diabetes mellitus without complication, without long-term current use  of insulin ([place]) - Plan: Comprehensive metabolic panel, Hemoglobin A1c  Essential  hypertension - Plan: CBC  CKD (chronic kidney disease) stage 3, GFR 30-59 ml/min (HCC) - Plan: CBC, Comprehensive metabolic panel  Diabetic polyneuropathy associated with type 2 diabetes mellitus ([place])  Lower abdominal pain - Plan: DG Abd Acute W/Chest, POCT urinalysis dipstick, Urine Culture  Fluid retention - Plan: DG Abd Acute W/Chest   Renie is here today with several concerns.  She notes that she has been out of her torsemide for several days, but previous to running out has been decreasing her dose to make it last.  She has put on several pounds.  She does not have any evidence any symptoms of CHF, will get her back on torsemide.    I have asked lab to add on a BNP for her today if possible- however I received word that this is not possible She does complain of bilateral lower abdominal discomfort.  This has been present for several days.  It seems that she thought the pain was resolved, until I palpated her abdomen.  Will obtain abdominal films today, as she notes constipation.  We will also do labs and urine culture. Given the duration of her symptoms and benign exam today I do not think there is dangerous pathology  She has stopped her metformin, so we will check her A1c today. Also check routine labs as above, will review her kidney function.  Encouraged her to try heat and over-the-counter lidocaine patches for her right trapezius pain. Signed Lamar Blinks, MD  12/17 Received her labs and x-ray Called her disc to discuss.  Her blood work looks fine.  A1c has gone up just a bit since she stopped her metformin.  It looks like over the summer her renal function was borderline so we had to stop metformin.  At this point her renal function looks okay, but will hold off on putting her back on metformin for the time being.  Asked her to come back in in about 4 months for repeat labs.  Suspect her abdominal discomfort was due to constipation, as is seen on her films from  yesterday. She reports that oftentimes her schedule makes it difficult for her to have time for a bowel movement.  We discussed ways to work with this, including using a stool softener as needed.  At this time she reports no abdominal pain.  We will send her a copy of all of her reports in the mail.   Results for orders placed or performed in visit on 12/18/17  CBC  Result Value Ref Range   WBC 8.7 4.0 - 10.5 K/uL   RBC 4.61 3.87 - 5.11 Mil/uL   Platelets 311.0 150.0 - 400.0 K/uL   Hemoglobin 13.6 12.0 - 15.0 g/dL   HCT 41.2 36.0 - 46.0 %   MCV 89.5 78.0 - 100.0 fl   MCHC 33.0 30.0 - 36.0 g/dL   RDW 14.9 11.5 - 15.5 %  Comprehensive metabolic panel  Result Value Ref Range   Sodium 138 135 - 145 mEq/L   Potassium 4.2 3.5 - 5.1 mEq/L   Chloride 107 96 - 112 mEq/L   CO2 24 19 - 32 mEq/L   Glucose, Bld 189 (H) 70 - 99 mg/dL   BUN 11 6 - 23 mg/dL   Creatinine, Ser 1.17 0.40 - 1.20 mg/dL   Total Bilirubin 0.4 0.2 - 1.2 mg/dL   Alkaline Phosphatase 68 39 - 117 U/L   AST 11  0 - 37 U/L   ALT 8 0 - 35 U/L   Total Protein 6.2 6.0 - 8.3 g/dL   Albumin 3.6 3.5 - 5.2 g/dL   Calcium 9.0 8.4 - 10.5 mg/dL   GFR 63.12 >60.00 mL/min  Hemoglobin A1c  Result Value Ref Range   Hgb A1c MFr Bld 7.5 (H) 4.6 - 6.5 %  POCT urinalysis dipstick  Result Value Ref Range   Color, UA yellow yellow   Clarity, UA cloudy (A) clear   Glucose, UA =100 (A) negative mg/dL   Bilirubin, UA negative negative   Ketones, POC UA negative negative mg/dL   Spec Grav, UA >=1.030 (A) 1.010 - 1.025   Blood, UA negative negative   pH, UA 6.0 5.0 - 8.0   Protein Ur, POC trace (A) negative mg/dL   Urobilinogen, UA 1.0 0.2 or 1.0 E.U./dL   Nitrite, UA Negative Negative   Leukocytes, UA Negative Negative   Dg Abd Acute W/chest  Result Date: 12/19/2017 CLINICAL DATA:  Lower abdominal pain and constipation for the past month EXAM: DG ABDOMEN ACUTE W/ 1V CHEST COMPARISON:  Chest x-ray of December 16, 2016 and abdominal  and pelvic CT scan of August 9th 2019. FINDINGS: The lungs are adequately inflated and clear. The heart and pulmonary vascularity are normal. The mediastinum is normal in width. Within the abdomen the colonic stool burden is moderate. There is no small or large bowel obstructive pattern. There are calcifications in the right adrenal fossa which appear to correspond to calcifications within the adrenal gland on the August 2019 CT scan. The observed bony structures exhibit no acute abnormalities. IMPRESSION: Moderately increased colonic stool burden may reflect constipation in the appropriate clinical setting. No evidence of ileus or obstruction. No active cardiopulmonary disease. Electronically Signed   By: David  Martinique M.D.   On: 12/19/2017 07:24

## 2017-12-18 ENCOUNTER — Encounter: Payer: Self-pay | Admitting: Family Medicine

## 2017-12-18 ENCOUNTER — Ambulatory Visit (HOSPITAL_BASED_OUTPATIENT_CLINIC_OR_DEPARTMENT_OTHER)
Admission: RE | Admit: 2017-12-18 | Discharge: 2017-12-18 | Disposition: A | Discharge status: Home / Self Care | Payer: Managed Care, Other (non HMO) | Source: Ambulatory Visit | Attending: Family Medicine | Admitting: Family Medicine

## 2017-12-18 ENCOUNTER — Ambulatory Visit: Payer: Managed Care, Other (non HMO) | Admitting: Family Medicine

## 2017-12-18 VITALS — BP 132/80 | HR 78 | Temp 98.4°F | Resp 16 | Ht 63.0 in | Wt 210.2 lb

## 2017-12-18 DIAGNOSIS — E1142 Type 2 diabetes mellitus with diabetic polyneuropathy: Secondary | ICD-10-CM | POA: Diagnosis not present

## 2017-12-18 DIAGNOSIS — R609 Edema, unspecified: Secondary | ICD-10-CM

## 2017-12-18 DIAGNOSIS — E1165 Type 2 diabetes mellitus with hyperglycemia: Secondary | ICD-10-CM | POA: Diagnosis not present

## 2017-12-18 DIAGNOSIS — R1030 Lower abdominal pain, unspecified: Secondary | ICD-10-CM | POA: Diagnosis present

## 2017-12-18 DIAGNOSIS — I10 Essential (primary) hypertension: Secondary | ICD-10-CM

## 2017-12-18 DIAGNOSIS — IMO0001 Reserved for inherently not codable concepts without codable children: Secondary | ICD-10-CM

## 2017-12-18 DIAGNOSIS — N183 Chronic kidney disease, stage 3 unspecified: Secondary | ICD-10-CM

## 2017-12-18 LAB — POCT URINALYSIS DIP (MANUAL ENTRY)
Bilirubin, UA: NEGATIVE
Blood, UA: NEGATIVE
Ketones, POC UA: NEGATIVE mg/dL
Leukocytes, UA: NEGATIVE
Nitrite, UA: NEGATIVE
Spec Grav, UA: >=1.03 — AB (ref 1.010–1.025)
Urobilinogen, UA: 1 E.U./dL
pH, UA: 6 (ref 5.0–8.0)

## 2017-12-18 NOTE — Patient Instructions (Addendum)
Salonpas patches may be helpful for your upper back pain I will be in touch with your labs asap   Please go to the ground floor for your x-rays after labs You can pick up the torsemide,  I refilled for you on Friday

## 2017-12-19 LAB — COMPREHENSIVE METABOLIC PANEL
ALT: 8 U/L (ref 0–35)
AST: 11 U/L (ref 0–37)
Albumin: 3.6 g/dL (ref 3.5–5.2)
Alkaline Phosphatase: 68 U/L (ref 39–117)
BUN: 11 mg/dL (ref 6–23)
CHLORIDE: 107 meq/L (ref 96–112)
CO2: 24 meq/L (ref 19–32)
CREATININE: 1.17 mg/dL (ref 0.40–1.20)
Calcium: 9 mg/dL (ref 8.4–10.5)
GFR: 63.12 mL/min (ref >60.00)
Glucose, Bld: 189 mg/dL — ABNORMAL HIGH (ref 70–99)
Potassium: 4.2 mEq/L (ref 3.5–5.1)
SODIUM: 138 meq/L (ref 135–145)
Total Bilirubin: 0.4 mg/dL (ref 0.2–1.2)
Total Protein: 6.2 g/dL (ref 6.0–8.3)

## 2017-12-19 LAB — URINE CULTURE
MICRO NUMBER:: 91501993
SPECIMEN QUALITY:: ADEQUATE

## 2017-12-19 LAB — CBC
HCT: 41.2 % (ref 36.0–46.0)
Hemoglobin: 13.6 g/dL (ref 12.0–15.0)
MCHC: 33 g/dL (ref 30.0–36.0)
MCV: 89.5 fl (ref 78.0–100.0)
Platelets: 311 10*3/uL (ref 150.0–400.0)
RBC: 4.61 Mil/uL (ref 3.87–5.11)
RDW: 14.9 % (ref 11.5–15.5)
WBC: 8.7 10*3/uL (ref 4.0–10.5)

## 2017-12-19 LAB — HEMOGLOBIN A1C: Hgb A1c MFr Bld: 7.5 % — ABNORMAL HIGH (ref 4.6–6.5)

## 2017-12-21 ENCOUNTER — Other Ambulatory Visit: Payer: Self-pay

## 2017-12-21 MED ORDER — TORSEMIDE 20 MG PO TABS: 10.0000 mg | ORAL_TABLET | Freq: Every day | ORAL | 6 refills | Status: DC

## 2018-01-21 ENCOUNTER — Other Ambulatory Visit: Payer: Self-pay

## 2018-01-21 ENCOUNTER — Emergency Department (HOSPITAL_BASED_OUTPATIENT_CLINIC_OR_DEPARTMENT_OTHER): Payer: Managed Care, Other (non HMO)

## 2018-01-21 ENCOUNTER — Emergency Department (HOSPITAL_BASED_OUTPATIENT_CLINIC_OR_DEPARTMENT_OTHER)
Admission: EM | Admit: 2018-01-21 | Discharge: 2018-01-21 | Disposition: A | Discharge status: Home / Self Care | Payer: Managed Care, Other (non HMO) | Attending: Emergency Medicine | Admitting: Emergency Medicine

## 2018-01-21 ENCOUNTER — Encounter (HOSPITAL_BASED_OUTPATIENT_CLINIC_OR_DEPARTMENT_OTHER): Payer: Self-pay | Admitting: Adult Health

## 2018-01-21 DIAGNOSIS — I129 Hypertensive chronic kidney disease with stage 1 through stage 4 chronic kidney disease, or unspecified chronic kidney disease: Secondary | ICD-10-CM | POA: Insufficient documentation

## 2018-01-21 DIAGNOSIS — M436 Torticollis: Secondary | ICD-10-CM | POA: Insufficient documentation

## 2018-01-21 DIAGNOSIS — N183 Chronic kidney disease, stage 3 (moderate): Secondary | ICD-10-CM | POA: Insufficient documentation

## 2018-01-21 DIAGNOSIS — E785 Hyperlipidemia, unspecified: Secondary | ICD-10-CM | POA: Insufficient documentation

## 2018-01-21 DIAGNOSIS — E119 Type 2 diabetes mellitus without complications: Secondary | ICD-10-CM | POA: Diagnosis not present

## 2018-01-21 DIAGNOSIS — J45909 Unspecified asthma, uncomplicated: Secondary | ICD-10-CM | POA: Diagnosis not present

## 2018-01-21 DIAGNOSIS — J039 Acute tonsillitis, unspecified: Secondary | ICD-10-CM | POA: Insufficient documentation

## 2018-01-21 DIAGNOSIS — J3502 Chronic adenoiditis: Secondary | ICD-10-CM

## 2018-01-21 DIAGNOSIS — F17210 Nicotine dependence, cigarettes, uncomplicated: Secondary | ICD-10-CM | POA: Insufficient documentation

## 2018-01-21 DIAGNOSIS — R221 Localized swelling, mass and lump, neck: Secondary | ICD-10-CM | POA: Diagnosis present

## 2018-01-21 LAB — CBC WITH DIFFERENTIAL/PLATELET
ABS IMMATURE GRANULOCYTES: 0.05 10*3/uL (ref 0.00–0.07)
Basophils Absolute: 0.1 10*3/uL (ref 0.0–0.1)
Basophils Relative: 1 %
Eosinophils Absolute: 0.5 10*3/uL (ref 0.0–0.5)
Eosinophils Relative: 5 %
HCT: 43.5 % (ref 36.0–46.0)
Hemoglobin: 13.8 g/dL (ref 12.0–15.0)
IMMATURE GRANULOCYTES: 1 %
Lymphocytes Relative: 36 %
Lymphs Abs: 3.8 10*3/uL (ref 0.7–4.0)
MCH: 28.4 pg (ref 26.0–34.0)
MCHC: 31.7 g/dL (ref 30.0–36.0)
MCV: 89.5 fL (ref 80.0–100.0)
Monocytes Absolute: 0.6 10*3/uL (ref 0.1–1.0)
Monocytes Relative: 5 %
Neutro Abs: 5.5 10*3/uL (ref 1.7–7.7)
Neutrophils Relative %: 52 %
Platelets: 312 10*3/uL (ref 150–400)
RBC: 4.86 MIL/uL (ref 3.87–5.11)
RDW: 13.4 % (ref 11.5–15.5)
WBC: 10.5 10*3/uL (ref 4.0–10.5)
nRBC: 0 % (ref 0.0–0.2)

## 2018-01-21 LAB — BASIC METABOLIC PANEL
Anion gap: 9 (ref 5–15)
BUN: 15 mg/dL (ref 6–20)
CO2: 27 mmol/L (ref 22–32)
Calcium: 9.3 mg/dL (ref 8.9–10.3)
Chloride: 103 mmol/L (ref 98–111)
Creatinine, Ser: 1.3 mg/dL — ABNORMAL HIGH (ref 0.44–1.00)
GFR calc Af Amer: 56 mL/min — ABNORMAL LOW (ref >60)
GFR calc non Af Amer: 48 mL/min — ABNORMAL LOW (ref >60)
GLUCOSE: 126 mg/dL — AB (ref 70–99)
Potassium: 3.8 mmol/L (ref 3.5–5.1)
Sodium: 139 mmol/L (ref 135–145)

## 2018-01-21 MED ORDER — IOPAMIDOL (ISOVUE-300) INJECTION 61%
100.0000 mL | Freq: Once | INTRAVENOUS | Status: AC | PRN
  Administered 2018-01-21: 75 mL via INTRAVENOUS

## 2018-01-21 MED ORDER — METHOCARBAMOL 500 MG PO TABS
500.0000 mg | ORAL_TABLET | Freq: Once | ORAL | Status: AC
  Administered 2018-01-21: 500 mg via ORAL
  Filled 2018-01-21: qty 1

## 2018-01-21 MED ORDER — HYDROCODONE-ACETAMINOPHEN 7.5-325 MG/15ML PO SOLN: 10.0000 mL | Freq: Once | ORAL | Status: DC

## 2018-01-21 MED ORDER — METHOCARBAMOL 500 MG PO TABS: 500.0000 mg | ORAL_TABLET | Freq: Two times a day (BID) | ORAL | 0 refills | Status: DC

## 2018-01-21 MED ORDER — OXYCODONE-ACETAMINOPHEN 5-325 MG PO TABS
1.0000 | ORAL_TABLET | Freq: Once | ORAL | Status: AC
  Administered 2018-01-21: 1 via ORAL
  Filled 2018-01-21: qty 1

## 2018-01-21 MED ORDER — AMOXICILLIN 500 MG PO CAPS
500.0000 mg | ORAL_CAPSULE | Freq: Once | ORAL | Status: AC
  Administered 2018-01-21: 500 mg via ORAL
  Filled 2018-01-21: qty 1

## 2018-01-21 MED ORDER — AMOXICILLIN 500 MG PO CAPS: 500.0000 mg | ORAL_CAPSULE | Freq: Two times a day (BID) | ORAL | 0 refills | Status: DC

## 2018-01-21 NOTE — ED Notes (Signed)
ED Provider at bedside. 

## 2018-01-21 NOTE — Discharge Instructions (Signed)
Your CT scan shows enlargement and inflammation of your tonsils and adenoids with surrounding inflamed lymph nodes suggestive of infection but there is no evidence of an abscess.  Inflammation in this area can also cause inflammation and spasm of the neck muscles causing the stiffness he is likely been experiencing.  Please take amoxicillin twice daily for the next 10 days.  Tylenol 1000 mg 3 times daily as needed for pain.  And Robaxin to help with pain and neck stiffness.  Robaxin can cause drowsiness, do not take before driving.  If symptoms are not improving please follow-up with your primary care doctor.  Return to the emergency department if you have fevers, difficulty swallowing or breathing, worsening neck or facial swelling or any other new or concerning symptoms.

## 2018-01-21 NOTE — ED Notes (Signed)
Spoke with provider and notified her about patient stating the medication administered did not take the pain away. Provider stated she would talk to patient

## 2018-01-21 NOTE — ED Notes (Signed)
Patient left at this time with all belongings. 

## 2018-01-21 NOTE — ED Notes (Signed)
CT waiting on bun/creat prior to performing CT scan per Radiology protocol

## 2018-01-21 NOTE — ED Triage Notes (Signed)
Pt presents with left ear pain, left sided neck pain and left sided jaw pain. She denies fevers. She reports that tylenol is not helping.

## 2018-01-21 NOTE — ED Provider Notes (Signed)
[facility] EMERGENCY DEPARTMENT Provider Note   CSN: [id number] Arrival date & time: 01/21/18  1737     History   Chief Complaint Chief Complaint  Patient presents with  . Facial Swelling    HPI Renee James is a 48 y.o. female.  Renee James is a 48 y.o. female with a history of diabetes, hypertension, hyperlipidemia, CKD stage III, asthma, Bell's palsy, depression and anxiety, who presents to the emergency department for evaluation of pain and swelling along her left neck and jaw.  Patient reports pain initially started last week and she thought it may have been dental related she had some pain and tenderness over her gums in this area where she has multiple molars missing but pain persistently worsened and over the past 2 to 3 days she is also developed swelling under the left jaw into the left neck.  Reports since swelling and pain worsened she is also noticed some stiffness and discomfort in her neck muscles as well and reports pain is worse with movement and head turning.  Pain seems to radiate into her ear which she has not had any ear drainage or change in hearing.  She also reports she has had some sore throat but no difficulty swallowing or tolerating secretions, no trismus.  She has not had any fevers or chills, no nausea or vomiting but she reports the pain gets so bad it sometimes makes her feel nauseated.  No headache or vision changes, no numbness tingling or weakness.  She has tried Tylenol for the pain without improvement.  Has not tried anything else to treat her symptoms has not yet seen her primary care doctor regarding the symptoms.  Denies any other aggravating or alleviating factors.     Past Medical History:  Diagnosis Date  . Allergy   . Anxiety   . Arthritis   . Asthma   . Bell's palsy   . Bell's palsy 2012  . Chronic kidney disease (CKD), stage III (moderate) (HCC)   . Depression   . Diabetes mellitus without complication ([place])   . Diabetic  neuropathy ([place])   . Headache    migraines  . Hyperlipidemia   . Hypertension   . Pneumonia   . Renal insufficiency 05/13/2015    Patient Active Problem List   Diagnosis Date Noted  . Acute pancreatitis 08/13/2017  . Asthma 08/13/2017  . Depression 08/13/2017  . Diabetic neuropathy ([place]) 08/13/2017  . Tobacco use 08/13/2017  . Type 2 diabetes mellitus ([place]) 08/13/2017  . 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 Post-operative state 05/09/2017  . Right ovarian cyst 03/07/2017  . Fibroids 03/07/2017  . Abnormal uterine bleeding (AUB) 02/22/2017  . CKD (chronic kidney disease) stage 3, GFR 30-59 ml/min (HCC) 05/16/2016  . Renal insufficiency 05/13/2015  . Ovarian torsion 04/28/2015  . Pain   . Hyperlipidemia 03/19/2015  . Obesity, unspecified 03/19/2015  . Uncontrolled type 2 diabetes mellitus without complication, without long-term current use of insulin ([place]) 03/19/2015  . Essential hypertension 03/19/2015  . GAD (generalized anxiety disorder) 03/19/2015    Past Surgical History:  Procedure Laterality Date  . ABDOMINAL HYSTERECTOMY  05/09/2017  . ECTOPIC PREGNANCY SURGERY    . LAPAROSCOPY N/A 04/28/2015   Procedure: LAPAROSCOPY DIAGNOSTIC;  Surgeon: Donnamae Jude, MD;  Location: [facility];  Service: Gynecology;  Laterality: N/A;  . OVARIAN CYST REMOVAL    . ROBOTIC ASSISTED LAPAROSCOPIC HYSTERECTOMY AND SALPINGECTOMY Bilateral 05/09/2017   Procedure: XI ROBOTIC ASSISTED LAPAROSCOPIC HYSTERECTOMY AND  Left SALPINGECTOMY, Lysis of Adhesions, cystoscopy;  Surgeon: Lavonia Drafts, MD;  Location: [facility];  Service: Gynecology;  Laterality: Bilateral;  . TUBAL LIGATION       OB History    Gravida  5   Para      Term      Preterm      AB  2   Living  3     SAB      TAB  1   Ectopic  1   Multiple      Live Births               Home Medications    Prior to Admission medications   Medication Sig Start Date End Date Taking? Authorizing Provider   albuterol (PROVENTIL HFA;VENTOLIN HFA) 108 (90 Base) MCG/ACT inhaler Inhale 1-2 puffs into the lungs every 6 (six) hours as needed for wheezing or shortness of breath. 08/29/16   Copland, Gay Filler, MD  ALPRAZolam (XANAX) 0.25 MG tablet Take 1 tablet (0.25 mg total) by mouth at bedtime as needed for anxiety or sleep. 08/21/17   Copland, Gay Filler, MD  citalopram (CELEXA) 20 MG tablet Take 1 tablet (20 mg total) by mouth daily. 06/14/17   Copland, Gay Filler, MD  ergocalciferol (VITAMIN D2) 50000 units capsule Take 50,000 Units by mouth every Monday.    [provider]  famotidine (PEPCID) 20 MG tablet Take 1 tablet (20 mg total) by mouth 2 (two) times daily. 08/16/17   Doreatha Lew, MD  Insulin Detemir (LEVEMIR) 100 UNIT/ML Pen Inject 10 Units into the skin daily at 10 pm. Check your morning blood sugar every day. If morning blood sugars aren't at goal (100), then increase the amount of evening levemir insulin by 1 unit every night until fasting blood sugars  goal of less than 100 is reached. Patient taking differently: Inject 25 Units into the skin 2 (two) times daily. Check your morning blood sugar every day. If morning blood sugars aren't at goal (100), then increase the amount of evening levemir insulin by 1 unit every night until fasting blood sugars  goal of less than 100 is reached. 03/07/16   Saguier, Percell Miller, PA-C  Lancets [facility] ULTRASOFT) lancets USE AS DIRECTED TO TEST BLOOD SUGARS DAILY DX: E11.9 03/07/16   Saguier, Percell Miller, PA-C  losartan (COZAAR) 100 MG tablet TAKE 1 TABLET BY MOUTH ONCE DAILY Patient taking differently: Take 100 mg by mouth daily.  05/17/17   Copland, Gay Filler, MD  metFORMIN (GLUCOPHAGE-XR) 500 MG 24 hr tablet Take 4 tablets (2,000 mg total) by mouth daily. 07/15/16   Copland, Gay Filler, MD  methocarbamol (ROBAXIN) 500 MG tablet Take 1 tablet (500 mg total) by mouth 2 (two) times daily. 08/11/17   Langston Masker B, PA-C  pioglitazone (ACTOS) 30 MG tablet Take 30 mg  by mouth daily.    [provider]  polyethylene glycol (MIRALAX / GLYCOLAX) packet Take 17 g by mouth daily. 08/17/17   Doreatha Lew, MD  pravastatin (PRAVACHOL) 20 MG tablet Take 20 mg by mouth daily.     [provider]  pregabalin (LYRICA) 75 MG capsule Take 75 mg by mouth daily as needed (for pain).     [provider]  torsemide (DEMADEX) 20 MG tablet Take 0.5-1 tablets (10-20 mg total) by mouth daily. 12/21/17   Copland, Gay Filler, MD  traMADol (ULTRAM) 50 MG tablet Take 1 tablet (50 mg total) by mouth every 12 (twelve) hours as needed.  For arthritis pain. Can cause sedation 11/16/17   Copland, Gay Filler, MD    Family History Family History  Problem Relation Age of Onset  . Diabetes Mother   . Heart disease Mother   . Kidney failure Mother   . Stroke Father   . Kidney failure Father   . Breast cancer Unknown     Social History Social History   Tobacco Use  . Smoking status: Current Every Day Smoker    Packs/day: 0.50    Years: 23.00    Pack years: 11.50    Types: Cigarettes  . Smokeless tobacco: Never Used  Substance Use Topics  . Alcohol use: Yes    Comment: occ  . Drug use: No     Allergies   Latex; Talc; Ace inhibitors; and Other   Review of Systems Review of Systems  Constitutional: Negative for chills and fever.  HENT: Positive for dental problem, ear pain, facial swelling, sore throat, trouble swallowing and voice change. Negative for congestion, postnasal drip, rhinorrhea and sinus pain.   Respiratory: Negative for cough, shortness of breath and stridor.   Cardiovascular: Negative for chest pain.  Gastrointestinal: Negative for abdominal pain.  Musculoskeletal: Positive for neck pain.     Physical Exam Updated Vital Signs BP 131/85   Pulse 81   Temp 98.8 F (37.1 C) (Oral)   Resp 18   LMP 05/09/2017   SpO2 99%   Physical Exam Vitals signs and nursing note reviewed.  Constitutional:      General: She is not  in acute distress.    Appearance: Normal appearance. She is well-developed. She is obese. She is not ill-appearing, toxic-appearing or diaphoretic.  HENT:     Head: Normocephalic and atraumatic.     Right Ear: Tympanic membrane and ear canal normal.     Left Ear: Tympanic membrane and ear canal normal.     Nose: Nose normal. No congestion or rhinorrhea.     Mouth/Throat:     Comments: Mucous membranes moist.  Posterior oropharynx clear, erythema, mild tonsillar edema, no exudates.  Tolerating secretions without difficulty, normal phonation, no trismus.  There is some tenderness over the left lower posterior gums where there are multiple missing teeth with some overlying erythema and swelling but no purulent drainage or evidence of abscess.  No sublingual swelling or induration. There is some submandibular facial swelling on the left. Eyes:     General:        Right eye: No discharge.        Left eye: No discharge.  Neck:     Comments: There is some submandibular swelling with tender cervical lymphadenopathy in this area, no palpable area of induration.  No overlying erythema or warmth.  Range of motion intact with some tenderness when turning to the left.  No nuchal rigidity.  No stridor on auscultation.  No midline C-spine tenderness. Cardiovascular:     Rate and Rhythm: Normal rate and regular rhythm.     Pulses: Normal pulses.     Heart sounds: Normal heart sounds.  Pulmonary:     Effort: Pulmonary effort is normal. No respiratory distress.     Breath sounds: Normal breath sounds.     Comments: Respirations equal and unlabored, patient able to speak in full sentences, lungs clear to auscultation bilaterally Musculoskeletal:        General: No deformity.  Skin:    General: Skin is warm and dry.  Neurological:     Mental Status: She  is alert and oriented to person, place, and time. Mental status is at baseline.     Coordination: Coordination normal.     Comments: Speech is clear, able  to follow commands CN III-XII intact Normal strength in upper and lower extremities bilaterally including dorsiflexion and plantar flexion, strong and equal grip strength Sensation normal to light and sharp touch Moves extremities without ataxia, coordination intact  Psychiatric:        Mood and Affect: Mood normal.        Behavior: Behavior normal.      ED Treatments / Results  Labs (all labs ordered are listed, but only abnormal results are displayed) Labs Reviewed  BASIC METABOLIC PANEL - Abnormal; Notable for the following components:      Result Value   Glucose, Bld 126 (*)    Creatinine, Ser 1.30 (*)    GFR calc non Af Amer 48 (*)    GFR calc Af Amer 56 (*)    All other components within normal limits  CBC WITH DIFFERENTIAL/PLATELET    EKG None  Radiology Ct Soft Tissue Neck W Contrast  Result Date: 01/21/2018 CLINICAL DATA:  48 year old female with left ear, jaw and neck pain. Dental pain, submandibular pain and swelling. EXAM: CT NECK WITH CONTRAST TECHNIQUE: Multidetector CT imaging of the neck was performed using the standard protocol following the bolus administration of intravenous contrast. CONTRAST:  85mL ISOVUE-300 IOPAMIDOL (ISOVUE-300) INJECTION 61% COMPARISON:  None. FINDINGS: Pharynx and larynx: The glottis is closed. Larynx is within normal limits. Mild symmetric palatine tonsil and adenoid enlargement. Similar mild hypertrophy of the lingual tonsil which is slightly asymmetric. No tonsillar heterogeneity. Normal parapharyngeal and retropharyngeal spaces. Salivary glands: Negative sublingual space. The submandibular spaces and glands also appear normal. Both parotid glands appear symmetric and normal. Thyroid: Negative. Lymph nodes: There are mildly enlarged but nonspecific bilateral level 2A lymph nodes measuring 11 millimeter short axis, and mildly rounded. Superimposed mildly increased number of small bilateral cervical nodes at the level 2 and 3 stations. No  cystic or necrotic nodes. No focal soft tissue inflammation identified in the face or neck. Vascular: Major vascular structures in the neck and at the skull base are patent. There is age advanced soft atherosclerotic plaque of the left ICA origin and bulb noted on series 3, image 55. Limited intracranial: Partially empty sella. Otherwise negative. Visualized orbits: Negative. Mastoids and visualized paranasal sinuses: Largely clear. There is only trace mucosal thickening in the left frontal sinus. Tympanic cavities and mastoids are clear. Skeleton: Scattered dental caries. No periapical lucency or acute dental finding identified. Mandible intact. Normal CT appearance of the bilateral TMJ. No acute osseous abnormality identified. Upper chest: Negative visible superior mediastinum and lung apices. Large body habitus. Incidental left breast/chest wall venous collaterals. Axillary lymph nodes are normal. IMPRESSION: 1. Mild generalized tonsillar and adenoid enlargement with superimposed reactive appearing bilateral level 2 and 3 lymph nodes. This constellation suggests acute URI. No abscess or complicating features identified. 2. Scattered dental caries but no acute dental finding identified. 3. Age advanced soft atherosclerotic plaque in the proximal Left ICA. Electronically Signed   By: Genevie Ann M.D.   On: 01/21/2018 20:11    Procedures Procedures (including critical care time)  Medications Ordered in ED Medications  oxyCODONE-acetaminophen (PERCOCET/ROXICET) 5-325 MG per tablet 1 tablet (1 tablet Oral Given 01/21/18 1853)  iopamidol (ISOVUE-300) 61 % injection 100 mL (75 mLs Intravenous Contrast Given 01/21/18 1946)  methocarbamol (ROBAXIN) tablet 500 mg (500 mg  Oral Given 01/21/18 2043)  amoxicillin (AMOXIL) capsule 500 mg (500 mg Oral Given 01/21/18 2043)     Initial Impression / Assessment and Plan / ED Course  I have reviewed the triage vital signs and the nursing notes.  Pertinent labs & imaging  results that were available during my care of the patient were reviewed by me and considered in my medical decision making (see chart for details).  Patient presents for evaluation of left-sided neck pain and swelling.  Pain is been present over the past week but swelling worse over the past 2 to 3 days.  On arrival patient afebrile with normal vitals.  Reporting pain.  No acute distress.  No stridor on exam patient is tolerating secretions without difficulty with normal phonation.  There is left-sided submandibular swelling and tenderness with associated lymphadenopathy.  Patient felt at first that this was related to dental pain she does have multiple left lower molars missing with some tenderness over the gums.  Also reports some sore throat discomfort with swallowing.  Differential includes dental abscess, tonsillitis, lymphadenitis or deep space abscess.  We will get basic labs and CT soft tissue neck.  Percocet given for pain.  Patient also has some neck stiffness associated with this but no nuchal rigidity and I suspect it is some degree of reactive torticollis.  Labs show no leukocytosis and normal hemoglobin, creatinine is elevated at 1.3, but patient does have CKD and has been referred to nephrology regarding this, this is not increased from baseline.  No other acute electrolyte derangements requiring intervention.  CT of the neck shows mild generalized tonsillar and adenoid enlargement with super imposed reactive lymph nodes suggestive of tonsillitis and lymphadenitis.  There is no evidence of deep space abscess or dental abscess.  Discussed results with Dr. Tamera Punt and will plan to treat with amoxicillin and have patient follow closely with PCP.  Patient also has some muscle spasm in the neck will give dose of Robaxin and sent home with Robaxin and Tylenol as needed for pain.  Return precautions discussed.  Patient expresses understanding and agreement with plan.  Discharged home in good  condition.  Final Clinical Impressions(s) / ED Diagnoses   Final diagnoses:  Tonsillitis  Adenoiditis  Torticollis    ED Discharge Orders         Ordered    amoxicillin (AMOXIL) 500 MG capsule  2 times daily,   Status:  Discontinued     01/21/18 2030    methocarbamol (ROBAXIN) 500 MG tablet  2 times daily     01/21/18 2030           Jacqlyn Larsen, [place] 01/25/18 1946    Malvin Johns, MD 01/29/18 [phone]

## 2018-01-23 ENCOUNTER — Ambulatory Visit: Payer: Managed Care, Other (non HMO) | Admitting: Family

## 2018-01-24 ENCOUNTER — Encounter: Payer: Self-pay | Admitting: Family Medicine

## 2018-01-24 ENCOUNTER — Ambulatory Visit: Payer: Managed Care, Other (non HMO) | Admitting: Family Medicine

## 2018-01-24 VITALS — BP 124/80 | HR 78 | Temp 98.6°F | Resp 16 | Ht 63.0 in | Wt 216.0 lb

## 2018-01-24 DIAGNOSIS — B373 Candidiasis of vulva and vagina: Secondary | ICD-10-CM | POA: Diagnosis not present

## 2018-01-24 DIAGNOSIS — I889 Nonspecific lymphadenitis, unspecified: Secondary | ICD-10-CM | POA: Diagnosis not present

## 2018-01-24 DIAGNOSIS — B3731 Acute candidiasis of vulva and vagina: Secondary | ICD-10-CM

## 2018-01-24 MED ORDER — HYDROCODONE-ACETAMINOPHEN 5-325 MG PO TABS: 1.0000 | ORAL_TABLET | Freq: Three times a day (TID) | ORAL | 0 refills | Status: AC | PRN

## 2018-01-24 MED ORDER — HYDROCODONE-ACETAMINOPHEN 5-325 MG PO TABS: 1.0000 | ORAL_TABLET | Freq: Three times a day (TID) | ORAL | 0 refills | Status: DC | PRN

## 2018-01-24 MED ORDER — AMOXICILLIN-POT CLAVULANATE 875-125 MG PO TABS: 1.0000 | ORAL_TABLET | Freq: Two times a day (BID) | ORAL | 0 refills | Status: DC

## 2018-01-24 MED ORDER — FLUCONAZOLE 150 MG PO TABS: 150.0000 mg | ORAL_TABLET | Freq: Once | ORAL | 0 refills | Status: AC

## 2018-01-24 NOTE — Patient Instructions (Addendum)
I am sorry that you are not feeling well today.  The swelling in your neck and jaw seems to be due to enlargement of lymph nodes- this was seen on your recent CT scan. As you are not getting better yet, we will stop amoxicillin and put you on Augmentin. I also gave you a prescription for hydrocodone to use as needed for pain.  This does contain Tylenol, so do not use it with additional Tylenol.  Also do not combine with any other sedating medication, such as tramadol or alprazolam.  Please let us know if you are not feeling better in the next few days   For future reference, please be careful with Tylenol.  Tylenol is safe when used as directed on the package, but can be dangerous if you take more than is recommended

## 2018-01-24 NOTE — Progress Notes (Signed)
[place] [facility] [address] [place], [place] [address] [phone]  Date:  01/24/2018   Name:  Renee James   DOB:  Feb 21, 1970   MRN:  [id number]  PCP:  Darreld Mclean, MD    Chief Complaint: No chief complaint on file.   History of Present Illness:  Renee James is a 48 y.o. very pleasant female patient who presents with the following:  Seen with tonsillitis in the ER on 1/19- the ER note is finished yet so I am not totally clear what happened during this visit. However patient has the after visit summary with her which is helpful.  It seems that she went in with a sore throat and swelling around her jaw, and had a CT.  CT results as below:   IMPRESSION: 1. Mild generalized tonsillar and adenoid enlargement with superimposed reactive appearing bilateral level 2 and 3 lymph nodes. This constellation suggests acute URI. No abscess or complicating features identified. 2. Scattered dental caries but no acute dental finding identified. 3. Age advanced soft atherosclerotic plaque in the proximal Left ICA.  She notes onset of a ST last week, and then tender swelling of the left side of her jaw a few days later.  This is what eventually brought her to the ER.  She has been taking amoxicillin for 3 days now, and has not noted any improvement She notes that she is taking Tylenol, and may take 3 extra strength at one time.  I have counseled her that this amount of Tylenol may be dangerous, and she should never take more Tylenol than the bottle recommends She states that even this amount of Tylenol is not controlling her pain, she also tried tramadol which did not help  She also notes concern of yeast infection developing with amoxicillin  She also was noted to have several cavities on her CT.  I asked her about dental care, and she states that she just canceled the cleaning she had scheduled for 2 weeks from now.  She was not  sure if she should go with her current symptoms.  I encouraged her to reschedule with her dentist as soon as possible  Renee James states that she has had pain in her left neck for over a month, which she believes we discussed together in December.  I do not have any mention of neck pain documented in my note from that time, though she did have complaint of right upper back pain for which we suggested Robaxin I apologize to her if I forgot our discussion of this issue, we discussed numerous things at that visit.  Patient Active Problem List   Diagnosis Date Noted  . Acute pancreatitis 08/13/2017  . Asthma 08/13/2017  . Depression 08/13/2017  . Diabetic neuropathy ([place]) 08/13/2017  . Tobacco use 08/13/2017  . Type 2 diabetes mellitus ([place]) 08/13/2017  . 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 Post-operative state 05/09/2017  . Right ovarian cyst 03/07/2017  . Fibroids 03/07/2017  . Abnormal uterine bleeding (AUB) 02/22/2017  . CKD (chronic kidney disease) stage 3, GFR 30-59 ml/min (HCC) 05/16/2016  . Renal insufficiency 05/13/2015  . Ovarian torsion 04/28/2015  . Pain   . Hyperlipidemia 03/19/2015  . Obesity, unspecified 03/19/2015  . Uncontrolled type 2 diabetes mellitus without complication, without long-term current use of insulin ([place]) 03/19/2015  . Essential hypertension 03/19/2015  . GAD (generalized anxiety disorder) 03/19/2015    Past Medical History:  Diagnosis Date  . Allergy   . Anxiety   . Arthritis   . Asthma   . Bell's palsy   . Bell's palsy 2012  . Chronic kidney disease (CKD), stage III (moderate) (HCC)   . Depression   . Diabetes mellitus without complication ([place])   . Diabetic neuropathy ([place])   . Headache    migraines  . Hyperlipidemia   . Hypertension   . Pneumonia   . Renal insufficiency 05/13/2015    Past Surgical History:  Procedure Laterality Date  . ABDOMINAL HYSTERECTOMY  05/09/2017  . ECTOPIC PREGNANCY SURGERY    . LAPAROSCOPY N/A  04/28/2015   Procedure: LAPAROSCOPY DIAGNOSTIC;  Surgeon: Donnamae Jude, MD;  Location: [facility];  Service: Gynecology;  Laterality: N/A;  . OVARIAN CYST REMOVAL    . ROBOTIC ASSISTED LAPAROSCOPIC HYSTERECTOMY AND SALPINGECTOMY Bilateral 05/09/2017   Procedure: XI ROBOTIC ASSISTED LAPAROSCOPIC HYSTERECTOMY AND Left SALPINGECTOMY, Lysis of Adhesions, cystoscopy;  Surgeon: Lavonia Drafts, MD;  Location: [facility];  Service: Gynecology;  Laterality: Bilateral;  . TUBAL LIGATION      Social History   Tobacco Use  . Smoking status: Current Every Day Smoker    Packs/day: 0.50    Years: 23.00    Pack years: 11.50    Types: Cigarettes  . Smokeless tobacco: Never Used  Substance Use Topics  . Alcohol use: Yes    Comment: occ  . Drug use: No    Family History  Problem Relation Age of Onset  . Diabetes Mother   . Heart disease Mother   . Kidney failure Mother   . Stroke Father   . Kidney failure Father   . Breast cancer Unknown     Allergies  Allergen Reactions  . Latex Hives, Shortness Of Breath and Itching  . Talc Hives, Shortness Of Breath and Itching  . Ace Inhibitors Cough  . 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  Current Outpatient Medications on File Prior to Visit  Medication Sig Dispense Refill  . albuterol (PROVENTIL HFA;VENTOLIN HFA) 108 (90 Base) MCG/ACT inhaler Inhale 1-2 puffs into the lungs every 6 (six) hours as needed for wheezing or shortness of breath. 1 Inhaler 6  . ALPRAZolam (XANAX) 0.25 MG tablet Take 1 tablet (0.25 mg total) by mouth at bedtime as needed for anxiety or sleep. 30 tablet 0  . amoxicillin (AMOXIL) 500 MG capsule Take 1 capsule (500 mg total) by mouth 2 (two) times daily for 10 days. 20 capsule 0  . citalopram (CELEXA) 20 MG tablet Take 1 tablet (20 mg total) by mouth daily. 90 tablet 3  . ergocalciferol (VITAMIN D2) 50000  units capsule Take 50,000 Units by mouth every Monday.    . famotidine (PEPCID) 20 MG tablet Take 1 tablet (20 mg total) by mouth 2 (two) times daily. 60 tablet 0  . Insulin Detemir (LEVEMIR) 100 UNIT/ML Pen Inject 10 Units into the skin daily at 10 pm. Check your morning blood sugar every day. If morning blood sugars aren't at goal (100), then increase the amount of evening levemir insulin by 1 unit every night until fasting blood sugars  goal of less than 100 is reached. (Patient taking differently: Inject 25 Units into the skin 2 (two) times daily. Check your morning blood sugar every day. If morning blood sugars aren't at goal (100), then increase the amount  of evening levemir insulin by 1 unit every night until fasting blood sugars  goal of less than 100 is reached.) 5 pen PRN  . Lancets (ONETOUCH ULTRASOFT) lancets USE AS DIRECTED TO TEST BLOOD SUGARS DAILY DX: E11.9 100 each 12  . losartan (COZAAR) 100 MG tablet TAKE 1 TABLET BY MOUTH ONCE DAILY (Patient taking differently: Take 100 mg by mouth daily. ) 30 tablet 0  . metFORMIN (GLUCOPHAGE-XR) 500 MG 24 hr tablet Take 4 tablets (2,000 mg total) by mouth daily. 120 tablet 0  . methocarbamol (ROBAXIN) 500 MG tablet Take 1 tablet (500 mg total) by mouth 2 (two) times daily. 20 tablet 0  . pioglitazone (ACTOS) 30 MG tablet Take 30 mg by mouth daily.    . polyethylene glycol (MIRALAX / GLYCOLAX) packet Take 17 g by mouth daily. 14 each 0  . pravastatin (PRAVACHOL) 20 MG tablet Take 20 mg by mouth daily.     . pregabalin (LYRICA) 75 MG capsule Take 75 mg by mouth daily as needed (for pain).     . torsemide (DEMADEX) 20 MG tablet Take 0.5-1 tablets (10-20 mg total) by mouth daily. 30 tablet 6  . traMADol (ULTRAM) 50 MG tablet Take 1 tablet (50 mg total) by mouth every 12 (twelve) hours as needed. For arthritis pain. Can cause sedation 30 tablet 1   No current facility-administered medications on file prior to visit.     Review of Systems:  As per  HPI- otherwise negative.   Physical Examination: Vitals:   01/24/18 1425  BP: 124/80  Pulse: 78  Resp: 16  Temp: 98.6 F (37 C)  SpO2: 98%   Vitals:   01/24/18 1425  Weight: 216 lb (98 kg)  Height: 5\' 3"  (1.6 m)   Body mass index is 38.26 kg/m. Ideal Body Weight: Weight in (lb) to have BMI = 25: 140.8  GEN: WDWN, NAD, Non-toxic, A & O x 3, obese, looks well HEENT: Atraumatic, Normocephalic. Neck supple. No masses, No LAD.  Teeth are not tender to palpation, but she is missing several molars. She has tender enlargement of the left anterior cervical and submandibular lymph nodes.  No masses noted in the oral cavity, no apparent dental abscess Neck shows normal range of motion.  No meningismus Pt states that swelling in her left sided facial nodes seems to be improving slightly - at any rate not getting worse  Ears and Nose: No external deformity. CV: RRR, No M/G/R. No JVD. No thrill. No extra heart sounds. PULM: CTA B, no wheezes, crackles, rhonchi. No retractions. No resp. distress. No accessory muscle use. EXTR: No c/c/e NEURO Normal gait.  PSYCH: Normally interactive. Conversant. Not depressed or anxious appearing.  Calm demeanor.   12/20/2017  3   11/16/2017  Tramadol Hcl 50 Mg Tablet  30.00 15 Je Cop  [id number]  [facility] ([id number])  1/1 10.00 MME Comm Ins  [place]  12/06/2017  3   11/29/2017  Pregabalin 75 Mg Capsule  30.00 30 Dh Pat  [id number]  [facility] ([id number])  0/5 0.50 LME Comm Ins  [place]  11/16/2017  3   11/16/2017  Tramadol Hcl 50 Mg Tablet  30.00 15 Je Cop  [id number]  [facility] ([id number])  0/1 10.00 MME Comm Ins  [place]  10/11/2017  3   06/07/2017  Pregabalin 75 Mg Capsule  30.00 30 Dh Pat  [id number]  [facility] ([id number])  0/3 0.50 LME Comm Ins  [place]  08/21/2017  2   08/21/2017  Alprazolam 0.25 Mg  Tablet  30.00 30 Je Cop  [id number]  Wal (1224)  0/0 0.50 LME Comm Ins  [place]  08/16/2017  2   08/16/2017  Hydrocodone-Acetamin 5-325 Mg  20.00 5 Ed Sil  [id number]  [facility] (1224)  0/0 20.00 MME Comm Ins  [place]  08/11/2017  1   08/11/2017   Hydrocodone-Acetamin 5-325 Mg  8.00 2 Al Mur  [id number]  Med (5269)  0/0 20.00 MME Comm Ins  [place]  08/07/2017  2   06/07/2017  Pregabalin 75 Mg Capsule  30.00 30 Dh Pat  [id number]  [facility] (1224)  1/5 0.50 LME Comm Ins  [place]  06/14/2017  1   06/14/2017  Alprazolam 0.25 Mg Tablet  30.00 30 Je Cop  [id number]  [facility] (1224)  0/0       Lab Results  Component Value Date   HGBA1C 7.5 (H) 12/18/2017    Assessment and Plan: Submandibular lymphadenitis - Plan: amoxicillin-clavulanate (AUGMENTIN) 875-125 MG tablet, HYDROcodone-acetaminophen (NORCO/VICODIN) 5-325 MG tablet, DISCONTINUED: HYDROcodone-acetaminophen (NORCO/VICODIN) 5-325 MG tablet  Yeast vaginitis - Plan: fluconazole (DIFLUCAN) 150 MG tablet  Renee James is here to follow-up from her recent ER visit with submandibular lymphadenitis.  She has failed to improve with amoxicillin.  We will change her to Augmentin.  Also gave her prescription for Diflucan. She has complaint of pain.  Her husband, who accompanies her today, states that the pain "keeps her up at night crying" Did prescribe Vicodin for her today.  Reminded her not to combine with any other sedating medication, and to avoid additional Tylenol with this medication. Reminded patient and her husband that is important to use Tylenol as directed.  Even accidental overdose can be very dangerous  Asked her to follow-up with me if not feeling better in the next few days. Meds ordered this encounter  Medications  . amoxicillin-clavulanate (AUGMENTIN) 875-125 MG tablet    Sig: Take 1 tablet by mouth 2 (two) times daily.    Dispense:  20 tablet    Refill:  0  . DISCONTD: HYDROcodone-acetaminophen (NORCO/VICODIN) 5-325 MG tablet    Sig: Take 1 tablet by mouth every 8 (eight) hours as needed for up to 5 days.    Dispense:  15 tablet    Refill:  0  . HYDROcodone-acetaminophen (NORCO/VICODIN) 5-325 MG tablet    Sig: Take 1 tablet by mouth every 8 (eight) hours as needed for up to 5 days.    Dispense:  15 tablet     Refill:  0  . fluconazole (DIFLUCAN) 150 MG tablet    Sig: Take 1 tablet (150 mg total) by mouth once for 1 dose. Repeat in one week if needed    Dispense:  2 tablet    Refill:  0     Signed Lamar Blinks, MD

## 2018-02-23 ENCOUNTER — Other Ambulatory Visit: Payer: Self-pay | Admitting: Family Medicine

## 2018-02-23 DIAGNOSIS — M79643 Pain in unspecified hand: Principal | ICD-10-CM

## 2018-02-23 DIAGNOSIS — G8929 Other chronic pain: Secondary | ICD-10-CM

## 2018-03-19 ENCOUNTER — Other Ambulatory Visit: Payer: Self-pay

## 2018-03-19 ENCOUNTER — Encounter (HOSPITAL_BASED_OUTPATIENT_CLINIC_OR_DEPARTMENT_OTHER): Payer: Self-pay | Admitting: Emergency Medicine

## 2018-03-19 ENCOUNTER — Emergency Department (HOSPITAL_BASED_OUTPATIENT_CLINIC_OR_DEPARTMENT_OTHER)
Admission: EM | Admit: 2018-03-19 | Discharge: 2018-03-19 | Disposition: A | Discharge status: Home / Self Care | Payer: Managed Care, Other (non HMO) | Attending: Emergency Medicine | Admitting: Emergency Medicine

## 2018-03-19 DIAGNOSIS — Z794 Long term (current) use of insulin: Secondary | ICD-10-CM | POA: Insufficient documentation

## 2018-03-19 DIAGNOSIS — F17210 Nicotine dependence, cigarettes, uncomplicated: Secondary | ICD-10-CM | POA: Diagnosis not present

## 2018-03-19 DIAGNOSIS — I129 Hypertensive chronic kidney disease with stage 1 through stage 4 chronic kidney disease, or unspecified chronic kidney disease: Secondary | ICD-10-CM | POA: Diagnosis not present

## 2018-03-19 DIAGNOSIS — E1122 Type 2 diabetes mellitus with diabetic chronic kidney disease: Secondary | ICD-10-CM | POA: Diagnosis not present

## 2018-03-19 DIAGNOSIS — N183 Chronic kidney disease, stage 3 (moderate): Secondary | ICD-10-CM | POA: Insufficient documentation

## 2018-03-19 DIAGNOSIS — Z79899 Other long term (current) drug therapy: Secondary | ICD-10-CM | POA: Insufficient documentation

## 2018-03-19 DIAGNOSIS — E1140 Type 2 diabetes mellitus with diabetic neuropathy, unspecified: Secondary | ICD-10-CM | POA: Diagnosis not present

## 2018-03-19 DIAGNOSIS — J45909 Unspecified asthma, uncomplicated: Secondary | ICD-10-CM | POA: Diagnosis not present

## 2018-03-19 DIAGNOSIS — M62830 Muscle spasm of back: Secondary | ICD-10-CM | POA: Insufficient documentation

## 2018-03-19 DIAGNOSIS — M5489 Other dorsalgia: Secondary | ICD-10-CM | POA: Diagnosis present

## 2018-03-19 MED ORDER — LIDOCAINE 5 % EX PTCH: 1.0000 | MEDICATED_PATCH | CUTANEOUS | 0 refills | Status: DC

## 2018-03-19 MED ORDER — OXYCODONE-ACETAMINOPHEN 5-325 MG PO TABS
1.0000 | ORAL_TABLET | Freq: Once | ORAL | Status: AC
  Administered 2018-03-19: 1 via ORAL
  Filled 2018-03-19: qty 1

## 2018-03-19 MED ORDER — DIAZEPAM 5 MG PO TABS
5.0000 mg | ORAL_TABLET | Freq: Once | ORAL | Status: AC
  Administered 2018-03-19: 5 mg via ORAL
  Filled 2018-03-19: qty 1

## 2018-03-19 MED ORDER — METHOCARBAMOL 750 MG PO TABS: 750.0000 mg | ORAL_TABLET | Freq: Two times a day (BID) | ORAL | 0 refills | Status: DC

## 2018-03-19 MED ORDER — OXYCODONE-ACETAMINOPHEN 5-325 MG PO TABS: 1.0000 | ORAL_TABLET | Freq: Four times a day (QID) | ORAL | 0 refills | Status: DC | PRN

## 2018-03-19 MED ORDER — HYDROMORPHONE HCL 1 MG/ML IJ SOLN
1.0000 mg | Freq: Once | INTRAMUSCULAR | Status: AC
  Administered 2018-03-19: 1 mg via INTRAMUSCULAR
  Filled 2018-03-19: qty 1

## 2018-03-19 NOTE — ED Notes (Signed)
Patient verbalizes understanding of discharge instructions. Opportunity for questioning and answers were provided. Armband removed by staff, pt discharged from ED.  

## 2018-03-19 NOTE — Discharge Instructions (Signed)
For severe pain, take 1-2 Percocet every 6 hours.  Take Robaxin twice daily as prescribed, as needed for muscle pain or spasms.  Do not drive or operate machinery while taking either of these medications.  Apply Lidoderm patch and change every 12 hours to area of worst pain.  Use a heating pad 3-4 times daily alternating 15 minutes on, 15 minutes off.  Please follow-up with your doctor as scheduled.  Please return to emergency department you develop any new or worsening symptoms.  Do not drink alcohol, drive, operate machinery or participate in any other potentially dangerous activities while taking opiate pain medication as it may make you sleepy. Do not take this medication with any other sedating medications, either prescription or over-the-counter. If you were prescribed Percocet or Vicodin, do not take these with acetaminophen (Tylenol) as it is already contained within these medications and overdose of Tylenol is dangerous.   This medication is an opiate (or narcotic) pain medication and can be habit forming.  Use it as little as possible to achieve adequate pain control.  Do not use or use it with extreme caution if you have a history of opiate abuse or dependence. This medication is intended for your use only - do not give any to anyone else and keep it in a secure place where nobody else, especially children, have access to it. It will also cause or worsen constipation, so you may want to consider taking an over-the-counter stool softener while you are taking this medication.

## 2018-03-19 NOTE — ED Triage Notes (Signed)
Pt presents with back pain between shoulder blades and is worse when she lifts right arm.

## 2018-03-19 NOTE — ED Notes (Signed)
Pt aware she needs driver when discharged. States her husband is driving

## 2018-03-19 NOTE — ED Provider Notes (Signed)
[facility] EMERGENCY DEPARTMENT Provider Note   CSN: [id number] Arrival date & time: 03/19/18  1435    History   Chief Complaint Chief Complaint  Patient presents with  . Back Pain    HPI Renee James is a 48 y.o. female with history of hypertension, hyperlipidemia, diabetes, stage III CKD, asthma, back pain who presents with right-sided upper back pain.  Patient has been dealing with the same type of pain over the past few months, however it has become worse.  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  She had right-sided upper chest pain at that time as well, however that has now resolved.  She denies any shortness of breath, nausea, vomiting, numbness or tingling.  Patient reports she received injections in her back a month ago by orthopedics, however this was in her lumbar spine.  She has had no problems with that area.  She has been taking muscle relaxers at home without relief.  She is also been applying icy hot.     HPI  Past Medical History:  Diagnosis Date  . Allergy   . Anxiety   . Arthritis   . Asthma   . Bell's palsy   . Bell's palsy 2012  . Chronic kidney disease (CKD), stage III (moderate) (HCC)   . Depression   . Diabetes mellitus without complication ([place])   . Diabetic neuropathy ([place])   . Headache    migraines  . Hyperlipidemia   . Hypertension   . Pneumonia   . Renal insufficiency 05/13/2015    Patient Active Problem List   Diagnosis Date Noted  . Acute pancreatitis 08/13/2017  . Asthma 08/13/2017  . Depression 08/13/2017  . Diabetic neuropathy ([place]) 08/13/2017  . Tobacco use 08/13/2017  . Type 2 diabetes mellitus ([place]) 08/13/2017  . 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 Post-operative state 05/09/2017  . Right ovarian cyst 03/07/2017  . Fibroids 03/07/2017  . Abnormal uterine bleeding (AUB) 02/22/2017  . CKD (chronic kidney disease) stage 3, GFR 30-59 ml/min (HCC) 05/16/2016  . Renal  insufficiency 05/13/2015  . Ovarian torsion 04/28/2015  . Pain   . Hyperlipidemia 03/19/2015  . Obesity, unspecified 03/19/2015  . Uncontrolled type 2 diabetes mellitus without complication, without long-term current use of insulin ([place]) 03/19/2015  . Essential hypertension 03/19/2015  . GAD (generalized anxiety disorder) 03/19/2015    Past Surgical History:  Procedure Laterality Date  . ABDOMINAL HYSTERECTOMY  05/09/2017  . ECTOPIC PREGNANCY SURGERY    . LAPAROSCOPY N/A 04/28/2015   Procedure: LAPAROSCOPY DIAGNOSTIC;  Surgeon: Donnamae Jude, MD;  Location: [facility];  Service: Gynecology;  Laterality: N/A;  . OVARIAN CYST REMOVAL    . ROBOTIC ASSISTED LAPAROSCOPIC HYSTERECTOMY AND SALPINGECTOMY Bilateral 05/09/2017   Procedure: XI ROBOTIC ASSISTED LAPAROSCOPIC HYSTERECTOMY AND Left SALPINGECTOMY, Lysis of Adhesions, cystoscopy;  Surgeon: Lavonia Drafts, MD;  Location: [facility];  Service: Gynecology;  Laterality: Bilateral;  . TUBAL LIGATION       OB History    Gravida  5   Para      Term      Preterm      AB  2   Living  3     SAB      TAB  1   Ectopic  1   Multiple      Live Births               Home Medications  Prior to Admission medications   Medication Sig Start Date End Date Taking? Authorizing Provider  albuterol (PROVENTIL HFA;VENTOLIN HFA) 108 (90 Base) MCG/ACT inhaler Inhale 1-2 puffs into the lungs every 6 (six) hours as needed for wheezing or shortness of breath. 08/29/16   Copland, Gay Filler, MD  ALPRAZolam (XANAX) 0.25 MG tablet Take 1 tablet (0.25 mg total) by mouth at bedtime as needed for anxiety or sleep. 08/21/17   Copland, Gay Filler, MD  amoxicillin-clavulanate (AUGMENTIN) 875-125 MG tablet Take 1 tablet by mouth 2 (two) times daily. 01/24/18   Copland, Gay Filler, MD  citalopram (CELEXA) 20 MG tablet Take 1 tablet (20 mg total) by mouth daily. 06/14/17   Copland, Gay Filler, MD  ergocalciferol (VITAMIN D2) 50000 units capsule Take 50,000  Units by mouth every Monday.    [provider]  famotidine (PEPCID) 20 MG tablet Take 1 tablet (20 mg total) by mouth 2 (two) times daily. 08/16/17   Doreatha Lew, MD  Insulin Detemir (LEVEMIR) 100 UNIT/ML Pen Inject 10 Units into the skin daily at 10 pm. Check your morning blood sugar every day. If morning blood sugars aren't at goal (100), then increase the amount of evening levemir insulin by 1 unit every night until fasting blood sugars  goal of less than 100 is reached. Patient taking differently: Inject 25 Units into the skin 2 (two) times daily. Check your morning blood sugar every day. If morning blood sugars aren't at goal (100), then increase the amount of evening levemir insulin by 1 unit every night until fasting blood sugars  goal of less than 100 is reached. 03/07/16   Saguier, Percell Miller, PA-C  Lancets [facility] ULTRASOFT) lancets USE AS DIRECTED TO TEST BLOOD SUGARS DAILY DX: E11.9 03/07/16   Saguier, Percell Miller, PA-C  lidocaine (LIDODERM) 5 % Place 1 patch onto the skin daily. Remove & Discard patch within 12 hours or as directed by MD 03/19/18   Shelby Peltz, Bea Graff, PA-C  losartan (COZAAR) 100 MG tablet TAKE 1 TABLET BY MOUTH ONCE DAILY Patient taking differently: Take 100 mg by mouth daily.  05/17/17   Copland, Gay Filler, MD  metFORMIN (GLUCOPHAGE-XR) 500 MG 24 hr tablet Take 4 tablets (2,000 mg total) by mouth daily. 07/15/16   Copland, Gay Filler, MD  methocarbamol (ROBAXIN) 750 MG tablet Take 1 tablet (750 mg total) by mouth 2 (two) times daily. 03/19/18   Khalik Pewitt, Bea Graff, PA-C  oxyCODONE-acetaminophen (PERCOCET/ROXICET) 5-325 MG tablet Take 1-2 tablets by mouth every 6 (six) hours as needed for severe pain. 03/19/18   Alcie Runions, Bea Graff, PA-C  pioglitazone (ACTOS) 30 MG tablet Take 30 mg by mouth daily.    [provider]  polyethylene glycol (MIRALAX / GLYCOLAX) packet Take 17 g by mouth daily. 08/17/17   Doreatha Lew, MD  pravastatin (PRAVACHOL) 20 MG tablet Take 20 mg  by mouth daily.     [provider]  pregabalin (LYRICA) 75 MG capsule Take 75 mg by mouth daily as needed (for pain).     [provider]  torsemide (DEMADEX) 20 MG tablet Take 0.5-1 tablets (10-20 mg total) by mouth daily. 12/21/17   Copland, Gay Filler, MD  traMADol (ULTRAM) 50 MG tablet TAKE 1 TABLET BY MOUTH EVERY 12 HOURS AS NEEDED FOR  ARTHRITIS  PAIN.  CAN  CAUSE  SEDATION 02/27/18   Copland, Gay Filler, MD    Family History Family History  Problem Relation Age of Onset  . Diabetes Mother   .  Heart disease Mother   . Kidney failure Mother   . Stroke Father   . Kidney failure Father   . Breast cancer Other     Social History Social History   Tobacco Use  . Smoking status: Current Every Day Smoker    Packs/day: 0.50    Years: 23.00    Pack years: 11.50    Types: Cigarettes  . Smokeless tobacco: Never Used  Substance Use Topics  . Alcohol use: Yes    Comment: occ  . Drug use: No     Allergies   Latex; Talc; Ace inhibitors; and Other   Review of Systems Review of Systems  Constitutional: Negative for chills and fever.  HENT: Negative for facial swelling and sore throat.   Respiratory: Negative for shortness of breath.   Cardiovascular: Positive for chest pain (resolved).  Gastrointestinal: Negative for abdominal pain, nausea and vomiting.  Genitourinary: Negative for dysuria.  Musculoskeletal: Positive for back pain and myalgias.  Skin: Negative for rash and wound.  Neurological: Positive for headaches. Negative for numbness.  Psychiatric/Behavioral: The patient is not nervous/anxious.      Physical Exam Updated Vital Signs BP 137/90 (BP Location: Left Arm)   Pulse 83   Temp 98.4 F (36.9 C) (Oral)   Resp 16   Ht 5\' 3"  (1.6 m)   Wt 89.4 kg   LMP 05/09/2017 (LMP Unknown)   SpO2 100%   BMI 34.90 kg/m   Physical Exam Vitals signs and nursing note reviewed.  Constitutional:      General: She is not in acute distress.    Appearance:  She is well-developed. She is not diaphoretic.  HENT:     Head: Normocephalic and atraumatic.     Mouth/Throat:     Pharynx: No oropharyngeal exudate.  Eyes:     General: No scleral icterus.       Right eye: No discharge.        Left eye: No discharge.     Conjunctiva/sclera: Conjunctivae normal.     Pupils: Pupils are equal, round, and reactive to light.  Neck:     Musculoskeletal: Normal range of motion and neck supple.     Thyroid: No thyromegaly.  Cardiovascular:     Rate and Rhythm: Normal rate and regular rhythm.     Heart sounds: Normal heart sounds. No murmur. No friction rub. No gallop.   Pulmonary:     Effort: Pulmonary effort is normal. No respiratory distress.     Breath sounds: Normal breath sounds. No stridor. No wheezing or rales.  Chest:     Chest wall: No tenderness.  Abdominal:     General: Bowel sounds are normal. There is no distension.     Palpations: Abdomen is soft.     Tenderness: There is no abdominal tenderness. There is no guarding or rebound.  Musculoskeletal:       Back:     Comments: Neck begin spasm to bilateral upper trapezius muscles, tenderness to palpation over the right scapula, pain worsens with abduction of the right arm  Lymphadenopathy:     Cervical: No cervical adenopathy.  Skin:    General: Skin is warm and dry.     Coloration: Skin is not pale.     Findings: No rash.  Neurological:     Mental Status: She is alert.     Coordination: Coordination normal.     Comments: Equal bilateral grip strength, 5/5 strength to bilateral upper extremities, normal sensation bilaterally  ED Treatments / Results  Labs (all labs ordered are listed, but only abnormal results are displayed) Labs Reviewed - No data to display  EKG None  Radiology No results found.  Procedures Procedures (including critical care time)  Medications Ordered in ED Medications  oxyCODONE-acetaminophen (PERCOCET/ROXICET) 5-325 MG per tablet 1 tablet (1  tablet Oral Given 03/19/18 1519)  diazepam (VALIUM) tablet 5 mg (5 mg Oral Given 03/19/18 1519)  HYDROmorphone (DILAUDID) injection 1 mg (1 mg Intramuscular Given 03/19/18 1647)     Initial Impression / Assessment and Plan / ED Course  I have reviewed the triage vital signs and the nursing notes.  Pertinent labs & imaging results that were available during my care of the patient were reviewed by me and considered in my medical decision making (see chart for details).        Patient reports acute on chronic upper back pain and tightness.  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  She has been doing this her whole life.  She has been told she has arthritis in her shoulder blades.  Patient has significant spasm to her right upper trapezius.  Normal neuro exam without focal deficits.  Patient has been doing this for the past several days.  Suspect chest pain related to chest wall spasm.  Doubt dissection, as back pain has been present for a while.  EKG shows NSR, no ischemic changes seen.  Low suspicion of ACS.  Patient feeling much better after Percocet, Valium, and IM Dilaudid.  Will discharge home with short course of Percocet, increased dose of Robaxin to 750 mg, and lidocaine patches.  Follow-up to PCP as planned in 3 days.  Return precautions discussed.  Patient understands and agrees with plan.  Patient vital stable throughout ED course and discharged in satisfactory condition. I discussed patient case with Dr. Tamera Punt who guided the patient's management and agrees with plan.   Final Clinical Impressions(s) / ED Diagnoses   Final diagnoses:  Muscle spasm of back    ED Discharge Orders         Ordered    oxyCODONE-acetaminophen (PERCOCET/ROXICET) 5-325 MG tablet  Every 6 hours PRN     03/19/18 1814    methocarbamol (ROBAXIN) 750 MG tablet  2 times daily     03/19/18 1814    lidocaine (LIDODERM) 5 %  Every 24 hours     03/19/18 1814           Frederica Kuster, PA-C  03/19/18 1817    Malvin Johns, MD 03/19/18 [phone]

## 2018-03-22 ENCOUNTER — Encounter: Payer: Self-pay | Admitting: Family Medicine

## 2018-03-22 ENCOUNTER — Other Ambulatory Visit: Payer: Self-pay

## 2018-03-22 ENCOUNTER — Ambulatory Visit: Payer: Managed Care, Other (non HMO) | Admitting: Family Medicine

## 2018-03-22 VITALS — BP 126/80 | HR 90 | Temp 99.5°F | Resp 16 | Ht 63.0 in | Wt 216.0 lb

## 2018-03-22 DIAGNOSIS — E1165 Type 2 diabetes mellitus with hyperglycemia: Secondary | ICD-10-CM | POA: Diagnosis not present

## 2018-03-22 DIAGNOSIS — R609 Edema, unspecified: Secondary | ICD-10-CM | POA: Diagnosis not present

## 2018-03-22 DIAGNOSIS — M5416 Radiculopathy, lumbar region: Secondary | ICD-10-CM | POA: Diagnosis not present

## 2018-03-22 DIAGNOSIS — E785 Hyperlipidemia, unspecified: Secondary | ICD-10-CM

## 2018-03-22 DIAGNOSIS — E1142 Type 2 diabetes mellitus with diabetic polyneuropathy: Secondary | ICD-10-CM

## 2018-03-22 DIAGNOSIS — G8929 Other chronic pain: Secondary | ICD-10-CM

## 2018-03-22 DIAGNOSIS — R635 Abnormal weight gain: Secondary | ICD-10-CM | POA: Diagnosis not present

## 2018-03-22 DIAGNOSIS — N183 Chronic kidney disease, stage 3 unspecified: Secondary | ICD-10-CM

## 2018-03-22 DIAGNOSIS — IMO0001 Reserved for inherently not codable concepts without codable children: Secondary | ICD-10-CM

## 2018-03-22 LAB — LIPID PANEL
CHOLESTEROL: 197 mg/dL (ref 0–200)
HDL: 37.3 mg/dL — ABNORMAL LOW (ref >39.00)
NonHDL: 159.83
Total CHOL/HDL Ratio: 5
Triglycerides: 231 mg/dL — ABNORMAL HIGH (ref 0.0–149.0)
VLDL: 46.2 mg/dL — ABNORMAL HIGH (ref 0.0–40.0)

## 2018-03-22 LAB — HEMOGLOBIN A1C: Hgb A1c MFr Bld: 9.1 % — ABNORMAL HIGH (ref 4.6–6.5)

## 2018-03-22 LAB — TSH: TSH: 1.43 u[IU]/mL (ref 0.35–4.50)

## 2018-03-22 LAB — LDL CHOLESTEROL, DIRECT: Direct LDL: 111 mg/dL

## 2018-03-22 LAB — BRAIN NATRIURETIC PEPTIDE: PRO B NATRI PEPTIDE: 8 pg/mL (ref 0.0–100.0)

## 2018-03-22 NOTE — Progress Notes (Addendum)
[facility] [address], [place], [place] [address] [phone]  Date:  03/22/2018   Name:  Renee James   DOB:  [birth year]   MRN:  [id number]  PCP:  Darreld Mclean, MD    Chief Complaint: Disability Consultation   History of Present Illness:  Renee James is a 48 y.o. very pleasant female patient who presents with the following: History of DM, neuropathy, CKD, hyperlipidemia Here today with concern of her back and spine issues She is seeing NSG for same- Dr. Brien Few She has a letter from Dr. Brien Few stating that she is permanently and totally disabled  She plans to pursue SSD, and is in contact with an appropriate lawyer She last went to work earlier this week for just a couple of hours Prior she was on light duty for 2 weeks -however she found that she was not able to really do even light duty  In addition to her spine problems, her health is further diminished by her diabetes with neuropathy, kidney disease, hyperlipidemia  Lab Results  Component Value Date   HGBA1C 7.5 (H) 12/18/2017   Wt Readings from Last 3 Encounters:  03/22/18 216 lb (98 kg)  03/19/18 197 lb (89.4 kg)  01/24/18 216 lb (98 kg)   She also notes that she has gained weight over the last 6 months or so and is not sure why.  She does not really feel like she is eating more Weight 8/18 was 190 lbs   Accompanied today by her husband, who contributes to the history Patient Active Problem List   Diagnosis Date Noted  . Acute pancreatitis 08/13/2017  . Asthma 08/13/2017  . Depression 08/13/2017  . Diabetic neuropathy ([place]) 08/13/2017  . Tobacco use 08/13/2017  . Type 2 diabetes mellitus ([place]) 08/13/2017  . 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 Post-operative state 05/09/2017  . Right ovarian cyst 03/07/2017  . Fibroids 03/07/2017  . Abnormal uterine bleeding (AUB) 02/22/2017  . CKD (chronic kidney disease) stage 3, GFR 30-59  ml/min (HCC) 05/16/2016  . Renal insufficiency 05/13/2015  . Ovarian torsion 04/28/2015  . Pain   . Hyperlipidemia 03/19/2015  . Obesity, unspecified 03/19/2015  . Uncontrolled type 2 diabetes mellitus without complication, without long-term current use of insulin ([place]) 03/19/2015  . Essential hypertension 03/19/2015  . GAD (generalized anxiety disorder) 03/19/2015    Past Medical History:  Diagnosis Date  . Allergy   . Anxiety   . Arthritis   . Asthma   . Bell's palsy   . Bell's palsy 2012  . Chronic kidney disease (CKD), stage III (moderate) (HCC)   . Depression   . Diabetes mellitus without complication ([place])   . Diabetic neuropathy ([place])   . Headache    migraines  . Hyperlipidemia   . Hypertension   . Pneumonia   . Renal insufficiency 05/13/2015    Past Surgical History:  Procedure Laterality Date  . ABDOMINAL HYSTERECTOMY  05/09/2017  . ECTOPIC PREGNANCY SURGERY    . LAPAROSCOPY N/A 04/28/2015   Procedure: LAPAROSCOPY DIAGNOSTIC;  Surgeon: Donnamae Jude, MD;  Location: [facility];  Service: Gynecology;  Laterality: N/A;  . OVARIAN CYST REMOVAL    . ROBOTIC ASSISTED LAPAROSCOPIC HYSTERECTOMY AND SALPINGECTOMY Bilateral 05/09/2017   Procedure: XI ROBOTIC ASSISTED LAPAROSCOPIC HYSTERECTOMY AND Left SALPINGECTOMY, Lysis of Adhesions, cystoscopy;  Surgeon: Lavonia Drafts, MD;  Location: [facility];  Service: Gynecology;  Laterality: Bilateral;  . TUBAL  LIGATION      Social History   Tobacco Use  . Smoking status: Current Every Day Smoker    Packs/day: 0.50    Years: 23.00    Pack years: 11.50    Types: Cigarettes  . Smokeless tobacco: Never Used  Substance Use Topics  . Alcohol use: Yes    Comment: occ  . Drug use: No    Family History  Problem Relation Age of Onset  . Diabetes Mother   . Heart disease Mother   . Kidney failure Mother   . Stroke Father   . Kidney failure Father   . Breast cancer Other     Allergies  Allergen Reactions  . Latex Hives,  Shortness Of Breath and Itching  . Talc Hives, Shortness Of Breath and Itching  . Ace Inhibitors Cough  . 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  Current Outpatient Medications on File Prior to Visit  Medication Sig Dispense Refill  . albuterol (PROVENTIL HFA;VENTOLIN HFA) 108 (90 Base) MCG/ACT inhaler Inhale 1-2 puffs into the lungs every 6 (six) hours as needed for wheezing or shortness of breath. 1 Inhaler 6  . ALPRAZolam (XANAX) 0.25 MG tablet Take 1 tablet (0.25 mg total) by mouth at bedtime as needed for anxiety or sleep. 30 tablet 0  . amoxicillin-clavulanate (AUGMENTIN) 875-125 MG tablet Take 1 tablet by mouth 2 (two) times daily. 20 tablet 0  . citalopram (CELEXA) 20 MG tablet Take 1 tablet (20 mg total) by mouth daily. 90 tablet 3  . ergocalciferol (VITAMIN D2) 50000 units capsule Take 50,000 Units by mouth every Monday.    . famotidine (PEPCID) 20 MG tablet Take 1 tablet (20 mg total) by mouth 2 (two) times daily. 60 tablet 0  . Insulin Detemir (LEVEMIR) 100 UNIT/ML Pen Inject 10 Units into the skin daily at 10 pm. Check your morning blood sugar every day. If morning blood sugars aren't at goal (100), then increase the amount of evening levemir insulin by 1 unit every night until fasting blood sugars  goal of less than 100 is reached. (Patient taking differently: Inject 25 Units into the skin 2 (two) times daily. Check your morning blood sugar every day. If morning blood sugars aren't at goal (100), then increase the amount of evening levemir insulin by 1 unit every night until fasting blood sugars  goal of less than 100 is reached.) 5 pen PRN  . Lancets (ONETOUCH ULTRASOFT) lancets USE AS DIRECTED TO TEST BLOOD SUGARS DAILY DX: E11.9 100 each 12  . lidocaine (LIDODERM) 5 % Place 1 patch onto the skin daily. Remove & Discard patch within 12 hours or as directed by MD 15  patch 0  . losartan (COZAAR) 100 MG tablet TAKE 1 TABLET BY MOUTH ONCE DAILY (Patient taking differently: Take 100 mg by mouth daily. ) 30 tablet 0  . metFORMIN (GLUCOPHAGE-XR) 500 MG 24 hr tablet Take 4 tablets (2,000 mg total) by mouth daily. 120 tablet 0  . methocarbamol (ROBAXIN) 750 MG tablet Take 1 tablet (750 mg total) by mouth 2 (two) times daily. 20 tablet 0  . oxyCODONE-acetaminophen (PERCOCET/ROXICET) 5-325 MG tablet Take 1-2 tablets by mouth every 6 (six) hours as needed for severe pain. 15 tablet 0  . pioglitazone (ACTOS) 30 MG tablet Take 30 mg by mouth daily.    . polyethylene glycol (MIRALAX /  GLYCOLAX) packet Take 17 g by mouth daily. 14 each 0  . pravastatin (PRAVACHOL) 20 MG tablet Take 20 mg by mouth daily.     . pregabalin (LYRICA) 75 MG capsule Take 75 mg by mouth daily as needed (for pain).     . torsemide (DEMADEX) 20 MG tablet Take 0.5-1 tablets (10-20 mg total) by mouth daily. 30 tablet 6  . traMADol (ULTRAM) 50 MG tablet TAKE 1 TABLET BY MOUTH EVERY 12 HOURS AS NEEDED FOR  ARTHRITIS  PAIN.  CAN  CAUSE  SEDATION 30 tablet 0   No current facility-administered medications on file prior to visit.     Review of Systems:  As per HPI- otherwise negative. No fever or chills, no chest pain or shortness of breath  Physical Examination: Vitals:   03/22/18 1304  BP: 126/80  Pulse: 90  Resp: 16  Temp: 99.5 F (37.5 C)  SpO2: 98%   Vitals:   03/22/18 1304  Weight: 216 lb (98 kg)  Height: 5\' 3"  (1.6 m)   Body mass index is 38.26 kg/m. Ideal Body Weight: Weight in (lb) to have BMI = 25: 140.8  GEN: WDWN, NAD, Non-toxic, A & O x 3, obese, otherwise looks well except for stooped posture. HEENT: Atraumatic, Normocephalic. Neck supple. No masses, No LAD. Ears and Nose: No external deformity. CV: RRR, No M/G/R. No JVD. No thrill. No extra heart sounds. PULM: CTA B, no wheezes, crackles, rhonchi. No retractions. No resp. distress. No accessory muscle use. EXTR: No  c/c/e NEURO slow gait, uses a cane PSYCH: Normally interactive. Conversant. Not depressed or anxious appearing.  Calm demeanor.    Assessment and Plan: Uncontrolled type 2 diabetes mellitus without complication, without long-term current use of insulin ([place]) - Plan: Hemoglobin A1c, Lipid panel  CKD (chronic kidney disease) stage 3, GFR 30-59 ml/min (HCC)  Diabetic polyneuropathy associated with type 2 diabetes mellitus ([place]) - Plan: Hemoglobin A1c, Lipid panel  Chronic radicular pain of lower back  Fluid retention - Plan: B Nat Peptide  Weight gain - Plan: Lipid panel, TSH  Following up today on a few concerns She is trying to get primary disability.  I did supply a brief letter summarizing her medical problems, but explained that her lawyer will likely need much more extensive records which we are happy to provide. For the time being we will check on her A1c, cholesterol She notes weight gain-we will check a thyroid and BNP.  I do not highly suspect this is a heart failure exacerbation, but she has noted some rapid weight change  Signed Lamar Blinks, MD  Received her labs, as below  Results for orders placed or performed in visit on 03/22/18  Hemoglobin A1c  Result Value Ref Range   Hgb A1c MFr Bld 9.1 (H) 4.6 - 6.5 %  Lipid panel  Result Value Ref Range   Cholesterol 197 0 - 200 mg/dL   Triglycerides 231.0 (H) 0.0 - 149.0 mg/dL   HDL 37.30 (L) >39.00 mg/dL   VLDL 46.2 (H) 0.0 - 40.0 mg/dL   Total CHOL/HDL Ratio 5    NonHDL 159.83   TSH  Result Value Ref Range   TSH 1.43 0.35 - 4.50 uIU/mL  B Nat Peptide  Result Value Ref Range   Pro B Natriuretic peptide (BNP) 8.0 0.0 - 100.0 pg/mL  LDL cholesterol, direct  Result Value Ref Range   Direct LDL 111.0 mg/dL    Your thyroid is normal, BNP does not show any sign  of heart failure.  So far no clear explanation for your weight gain  Your A1c is above goal.  At this time, I think you are taking Actos and also insulin,  but no other diabetes medications Can you please remind me of exactly how you are using your insulin?  You can reply to me here  Your cholesterol is not at goal with Pravachol.  I would like to change you to a more potent medication, such as Crestor.  Please let me know if you would be okay with this change  Thank you, please reply to me regarding your insulin regimen and the cholesterol medication  Recent renal function on chart from January.  GFR at 56  addnd 3/20- phone call from pt  Pt called with concerns about her lab values: A1C 9.1 and triglycerides 231; she is agreeable to starting crestor, and the prescription can be [place], [place]; she is also concerned about her weight gain; pt says that she "is ballooning", and her diet has not changed; the pt says that she takes actos daily; she also says that she takes 24 units of levimir insulin in the morning and evening; however she says that the amount insulin  Listed on her prescription is wrong; the pt says that her fasting blood surgar is in the 120s; she also says that Dr Hollie Salk and Dr Lorelei Pont were to talk because of a previous conversation to start her on another medication for her blood sugar; the pt can be contacted at [phone], and a message can be left on voicemail; will route to office for notification of this encounter; the pt says that she will also contact Dr Lorelei Pont via my-chart.

## 2018-03-22 NOTE — Patient Instructions (Signed)
I gave you a letter in support of your disability case today We will do some routine labs for you I will be in touch with these results and we will look for any explanation for weight gain

## 2018-03-23 ENCOUNTER — Encounter: Payer: Self-pay | Admitting: Family Medicine

## 2018-03-23 ENCOUNTER — Telehealth: Payer: Self-pay | Admitting: Family Medicine

## 2018-03-23 DIAGNOSIS — E785 Hyperlipidemia, unspecified: Secondary | ICD-10-CM

## 2018-03-23 DIAGNOSIS — IMO0001 Reserved for inherently not codable concepts without codable children: Secondary | ICD-10-CM

## 2018-03-23 DIAGNOSIS — G8929 Other chronic pain: Secondary | ICD-10-CM

## 2018-03-23 DIAGNOSIS — M5416 Radiculopathy, lumbar region: Secondary | ICD-10-CM

## 2018-03-23 DIAGNOSIS — E1165 Type 2 diabetes mellitus with hyperglycemia: Principal | ICD-10-CM

## 2018-03-23 MED ORDER — ROSUVASTATIN CALCIUM 20 MG PO TABS: 20.0000 mg | ORAL_TABLET | Freq: Every day | ORAL | 3 refills | Status: DC

## 2018-03-23 NOTE — Addendum Note (Signed)
Addended by: Lamar Blinks C on: 03/23/2018 04:49 PM   Modules accepted: Orders

## 2018-03-23 NOTE — Telephone Encounter (Signed)
Pt called with concerns about her lab values: A1C 9.1 and triglycerides 231; she is agreeable to starting crestor, and the prescription can be [place], [place]; she is also concerned about her weight gain; pt says that she "is ballooning", and her diet has not changed; the pt says that she takes actos daily; she also says that she takes 24 units of levimir insulin in the morning and evening; however she says that the amount insulin  Listed on her prescription is wrong; the pt says that her fasting blood surgar is in the 120s; she also says that Dr Hollie Salk and Dr Lorelei Pont were to talk because of a previous conversation to start her on another medication for her blood sugar; the pt can be contacted at [phone], and a message can be left on voicemail; will route to office for notification of this encounter; the pt says that she will also contact Dr Lorelei Pont via my-chart.

## 2018-03-30 MED ORDER — GLIPIZIDE 5 MG PO TABS: 5.0000 mg | ORAL_TABLET | Freq: Two times a day (BID) | ORAL | 3 refills | Status: AC

## 2018-03-30 NOTE — Addendum Note (Signed)
Addended by: Lamar Blinks C on: 03/30/2018 07:06 AM   Modules accepted: Orders

## 2018-04-03 MED ORDER — ROSUVASTATIN CALCIUM 20 MG PO TABS: 20.0000 mg | ORAL_TABLET | Freq: Every day | ORAL | 3 refills | Status: DC

## 2018-04-03 NOTE — Addendum Note (Signed)
Addended by: Lamar Blinks C on: 04/03/2018 10:05 AM   Modules accepted: Orders

## 2018-04-04 ENCOUNTER — Other Ambulatory Visit: Payer: Self-pay | Admitting: Family Medicine

## 2018-04-04 DIAGNOSIS — G8929 Other chronic pain: Secondary | ICD-10-CM

## 2018-04-04 DIAGNOSIS — M79643 Pain in unspecified hand: Principal | ICD-10-CM

## 2018-04-04 MED ORDER — OXYCODONE-ACETAMINOPHEN 5-325 MG PO TABS: 1.0000 | ORAL_TABLET | Freq: Four times a day (QID) | ORAL | 0 refills | Status: AC | PRN

## 2018-04-04 MED ORDER — INSULIN DETEMIR 100 UNIT/ML FLEXPEN: PEN_INJECTOR | SUBCUTANEOUS | 6 refills | Status: AC

## 2018-04-04 MED ORDER — METHOCARBAMOL 500 MG PO TABS: 500.0000 mg | ORAL_TABLET | Freq: Three times a day (TID) | ORAL | 2 refills | Status: DC | PRN

## 2018-04-04 NOTE — Telephone Encounter (Signed)
Please advise on pt's refill request  

## 2018-04-04 NOTE — Addendum Note (Signed)
Addended by: Lamar Blinks C on: 04/04/2018 03:40 PM   Modules accepted: Orders

## 2018-04-04 NOTE — Telephone Encounter (Signed)
Refilled her levemir- 24 units BID Reviewed PMPD 03/27/2018  3   03/27/2018  Acetaminophen-Cod #3 Tablet  10.00 1 Da Mac   [id number]   [facility] (3540)   0  45.00 MME  Comm Ins   [place]  03/19/2018  3   03/19/2018  Oxycodone-Acetaminophen 5-325  15.00 2 Al [place]   [id number]   [facility] (3540)   0  56.25 MME  Comm Ins   [place]  02/27/2018  3   02/27/2018  Tramadol Hcl 50 MG Tablet  30.00 15 Je Cop   [id number]   [facility] (3540)   0  10.00 MME  Comm Ins   [place]  02/23/2018  3   11/29/2017  Pregabalin 75 MG Capsule  30.00 30 Dh Pat   [id number]   [facility] (3540)   1  0.50 LME  Comm Ins   [place]  01/24/2018  3   01/24/2018  Hydrocodone-Acetamin 5-325 MG  15.00 5 Je Cop   [id number]   [facility] (3540)   0  15.00 MME  Comm Ins   [place]  12/20/2017  3   11/16/2017  Tramadol Hcl 50 MG Tablet  30.00 15 Je Cop   [id number]   [facility] (3540)   1  10.00 MME  Comm Ins   [place]  12/06/2017  3   11/29/2017  Pregabalin 75 MG Capsule  30.00 30 Dh Pat   [id number]   [facility] (3540)   0  0.50 LME  Comm Ins   [place]  11/16/2017  3   11/16/2017  Tramadol Hcl 50 MG Tablet  30.00 15 Je Cop   [id number]   [facility] (3540)   0  10.00 MME  Comm Ins   [place]  10/11/2017  3   06/07/2017  Pregabalin 75 MG Capsule  30.00 30 Dh Pat   [id number]   [facility] (3540)   0  0.50 LME  Comm Ins   [place]  08/21/2017  2   08/21/2017  Alprazolam 0.25 MG Tablet  30.00 30 Je Cop   [id number]   [facility] (1224)   0  0.50 LME  Comm Ins   [place]  08/16/2017  2   08/16/2017  Hydrocodone-Acetamin 5-325 MG  20.00 5 Ed Sil   [id number]   [facility] (1224)   0  20.00 MME  Comm Ins   [place]

## 2018-05-25 ENCOUNTER — Other Ambulatory Visit: Payer: Self-pay

## 2018-05-25 DIAGNOSIS — I10 Essential (primary) hypertension: Secondary | ICD-10-CM

## 2018-05-25 MED ORDER — LOSARTAN POTASSIUM 100 MG PO TABS: 100.0000 mg | ORAL_TABLET | Freq: Every day | ORAL | 0 refills | Status: AC

## 2018-05-25 MED ORDER — PREGABALIN 75 MG PO CAPS: 75.0000 mg | ORAL_CAPSULE | Freq: Every day | ORAL | 0 refills | Status: AC | PRN

## 2018-05-25 MED ORDER — PREGABALIN 75 MG PO CAPS: 75.0000 mg | ORAL_CAPSULE | Freq: Every day | ORAL | 0 refills | Status: DC | PRN

## 2018-06-04 ENCOUNTER — Other Ambulatory Visit: Payer: Self-pay | Admitting: Family Medicine

## 2018-06-04 ENCOUNTER — Telehealth: Payer: Self-pay | Admitting: Family Medicine

## 2018-06-04 DIAGNOSIS — R062 Wheezing: Secondary | ICD-10-CM

## 2018-06-04 DIAGNOSIS — G4700 Insomnia, unspecified: Secondary | ICD-10-CM

## 2018-06-04 DIAGNOSIS — G8929 Other chronic pain: Secondary | ICD-10-CM

## 2018-06-04 NOTE — Telephone Encounter (Signed)
Copied from [place] [id number]. Topic: Quick Communication - Rx Refill/Question >> Jun 04, 2018 11:58 AM Blase Mess A wrote: Medication:pioglitazone (ACTOS) 30 MG tablet [[id number]] , ALPRAZolam (XANAX) 0.25 MG tablet [[id number]] , oxyCODONE-acetaminophen (PERCOCET/ROXICET) 5-325 MG tablet [[id number]] , torsemide (DEMADEX) 20 MG tablet [[id number]] , methocarbamol (ROBAXIN) 500 MG tablet [[id number]]   Has the patient contacted their pharmacy? Yes  (Agent: If no, request that the patient contact the pharmacy for the refill.) (Agent: If yes, when and what did the pharmacy advise?)  Preferred Pharmacy (with phone number or street name): [facility] [phone] (Phone) [phone] (Fax)    Agent: Please be advised that RX refills may take up to 3 business days. We ask that you follow-up with your pharmacy.

## 2018-06-04 NOTE — Telephone Encounter (Signed)
Copied from [place] [id number]. Topic: Quick Communication - See Telephone Encounter >> Jun 04, 2018 12:04 PM Blase Mess A wrote: CRM for notification. See Telephone encounter for: 06/04/18.  Patinet is calling because she has a new Insurance account manager with [facility]. Patient would like to know would Dr. Edilia Bo refer her to anyone with this network. Please advise CB- [phone]

## 2018-06-05 ENCOUNTER — Encounter: Payer: Self-pay | Admitting: Family Medicine

## 2018-06-05 MED ORDER — POLYETHYLENE GLYCOL 3350 17 G PO PACK: 17.0000 g | PACK | Freq: Every day | ORAL | 0 refills | Status: AC

## 2018-06-05 MED ORDER — METHOCARBAMOL 500 MG PO TABS: 500.0000 mg | ORAL_TABLET | Freq: Three times a day (TID) | ORAL | 2 refills | Status: DC | PRN

## 2018-06-05 MED ORDER — TORSEMIDE 20 MG PO TABS: 10.0000 mg | ORAL_TABLET | Freq: Every day | ORAL | 3 refills | Status: AC

## 2018-10-24 ENCOUNTER — Other Ambulatory Visit: Payer: Self-pay

## 2018-10-24 ENCOUNTER — Emergency Department (HOSPITAL_BASED_OUTPATIENT_CLINIC_OR_DEPARTMENT_OTHER)
Admission: EM | Admit: 2018-10-24 | Discharge: 2018-10-24 | Disposition: A | Discharge status: Home / Self Care | Payer: BLUE CROSS/BLUE SHIELD | Attending: Emergency Medicine | Admitting: Emergency Medicine

## 2018-10-24 ENCOUNTER — Encounter (HOSPITAL_BASED_OUTPATIENT_CLINIC_OR_DEPARTMENT_OTHER): Payer: Self-pay | Admitting: Emergency Medicine

## 2018-10-24 DIAGNOSIS — N183 Chronic kidney disease, stage 3 unspecified: Secondary | ICD-10-CM | POA: Diagnosis not present

## 2018-10-24 DIAGNOSIS — H5789 Other specified disorders of eye and adnexa: Secondary | ICD-10-CM | POA: Diagnosis not present

## 2018-10-24 DIAGNOSIS — Z79899 Other long term (current) drug therapy: Secondary | ICD-10-CM | POA: Insufficient documentation

## 2018-10-24 DIAGNOSIS — J45909 Unspecified asthma, uncomplicated: Secondary | ICD-10-CM | POA: Diagnosis not present

## 2018-10-24 DIAGNOSIS — Z7984 Long term (current) use of oral hypoglycemic drugs: Secondary | ICD-10-CM | POA: Insufficient documentation

## 2018-10-24 DIAGNOSIS — E1122 Type 2 diabetes mellitus with diabetic chronic kidney disease: Secondary | ICD-10-CM | POA: Diagnosis not present

## 2018-10-24 DIAGNOSIS — R21 Rash and other nonspecific skin eruption: Secondary | ICD-10-CM | POA: Insufficient documentation

## 2018-10-24 DIAGNOSIS — Z91040 Latex allergy status: Secondary | ICD-10-CM | POA: Diagnosis not present

## 2018-10-24 DIAGNOSIS — I129 Hypertensive chronic kidney disease with stage 1 through stage 4 chronic kidney disease, or unspecified chronic kidney disease: Secondary | ICD-10-CM | POA: Diagnosis not present

## 2018-10-24 DIAGNOSIS — F17210 Nicotine dependence, cigarettes, uncomplicated: Secondary | ICD-10-CM | POA: Diagnosis not present

## 2018-10-24 MED ORDER — DEXAMETHASONE 4 MG PO TABS: 4.0000 mg | ORAL_TABLET | Freq: Two times a day (BID) | ORAL | 0 refills | Status: AC

## 2018-10-24 MED ORDER — EPINEPHRINE 0.3 MG/0.3ML IJ SOAJ: 0.3000 mg | INTRAMUSCULAR | 0 refills | Status: AC | PRN

## 2018-10-24 NOTE — ED Triage Notes (Signed)
Pt reports possible exposure to latex when she had her hair done 1 week ago, started having facial rash and swollen eys but worse only in the morning for the past week. Known allergy to latex, no other known allergen. Also reports hx asthma and reports congestion and throat tightness every morning. Alert and oriented x 4, no obvious resp distress.

## 2018-10-24 NOTE — ED Notes (Signed)
ED Provider at bedside. 

## 2018-10-24 NOTE — ED Notes (Signed)
Pt reports coughing stating that she has been using vicks vapor rub under her nose for congestion and taking benadryl at home

## 2018-10-26 NOTE — ED Provider Notes (Signed)
[facility] EMERGENCY DEPARTMENT Provider Note   CSN: [id number] Arrival date & time: 10/24/18  1750     History   Chief Complaint Chief Complaint  Patient presents with  . Rash    HPI Renee James is a 48 y.o. female.     HPI   48 year old female with swelling and itching to both her eyes.  Symptom onset about a week ago.  This occurred shortly after having her hair done.  She subsequently took out extensions but symptoms have persisted.  She says some type of glue was used she was having her hair done.  She stopped no she is having a reaction that or not.  No eye pain per se.  No change in visual acuity.  No acute respiratory complaints.  Denies any GI complaints.  Has been taking Benadryl intermittently with some improvement.  Past Medical History:  Diagnosis Date  . Allergy   . Anxiety   . Arthritis   . Asthma   . Bell's palsy   . Bell's palsy 2012  . Chronic kidney disease (CKD), stage III (moderate)   . Depression   . Diabetes mellitus without complication ([place])   . Diabetic neuropathy ([place])   . Headache    migraines  . Hyperlipidemia   . Hypertension   . Pneumonia   . Renal insufficiency 05/13/2015    Patient Active Problem List   Diagnosis Date Noted  . Acute pancreatitis 08/13/2017  . Asthma 08/13/2017  . Depression 08/13/2017  . Diabetic neuropathy ([place]) 08/13/2017  . Tobacco use 08/13/2017  . Type 2 diabetes mellitus ([place]) 08/13/2017  . 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 Post-operative state 05/09/2017  . Right ovarian cyst 03/07/2017  . Fibroids 03/07/2017  . Abnormal uterine bleeding (AUB) 02/22/2017  . CKD (chronic kidney disease) stage 3, GFR 30-59 ml/min (HCC) 05/16/2016  . Renal insufficiency 05/13/2015  . Ovarian torsion 04/28/2015  . Pain   . Hyperlipidemia 03/19/2015  . Obesity, unspecified 03/19/2015  . Uncontrolled type 2 diabetes mellitus without complication, without long-term current use of insulin  03/19/2015  . Essential hypertension 03/19/2015  . GAD (generalized anxiety disorder) 03/19/2015    Past Surgical History:  Procedure Laterality Date  . ABDOMINAL HYSTERECTOMY  05/09/2017  . ECTOPIC PREGNANCY SURGERY    . LAPAROSCOPY N/A 04/28/2015   Procedure: LAPAROSCOPY DIAGNOSTIC;  Surgeon: Donnamae Jude, MD;  Location: [facility];  Service: Gynecology;  Laterality: N/A;  . OVARIAN CYST REMOVAL    . ROBOTIC ASSISTED LAPAROSCOPIC HYSTERECTOMY AND SALPINGECTOMY Bilateral 05/09/2017   Procedure: XI ROBOTIC ASSISTED LAPAROSCOPIC HYSTERECTOMY AND Left SALPINGECTOMY, Lysis of Adhesions, cystoscopy;  Surgeon: Lavonia Drafts, MD;  Location: [facility];  Service: Gynecology;  Laterality: Bilateral;  . TUBAL LIGATION       OB History    Gravida  5   Para      Term      Preterm      AB  2   Living  3     SAB      TAB  1   Ectopic  1   Multiple      Live Births               Home Medications    Prior to Admission medications   Medication Sig Start Date End Date Taking? Authorizing Provider  albuterol (PROVENTIL HFA;VENTOLIN HFA) 108 (90 Base) MCG/ACT inhaler Inhale 1-2 puffs into the lungs every 6 (six) hours as needed for wheezing or  shortness of breath. 08/29/16   Copland, Gay Filler, MD  ALPRAZolam (XANAX) 0.25 MG tablet Take 1 tablet (0.25 mg total) by mouth at bedtime as needed for anxiety or sleep. 08/21/17   Copland, Gay Filler, MD  citalopram (CELEXA) 20 MG tablet Take 1 tablet (20 mg total) by mouth daily. 06/14/17   Copland, Gay Filler, MD  dexamethasone (DECADRON) 4 MG tablet Take 1 tablet (4 mg total) by mouth 2 (two) times daily. 10/24/18   Virgel Manifold, MD  EPINEPHrine 0.3 mg/0.3 mL IJ SOAJ injection Inject 0.3 mLs (0.3 mg total) into the muscle as needed for anaphylaxis. 10/24/18   Virgel Manifold, MD  ergocalciferol (VITAMIN D2) 50000 units capsule Take 50,000 Units by mouth every Monday.    [provider]  famotidine (PEPCID) 20 MG tablet Take 1  tablet (20 mg total) by mouth 2 (two) times daily. 08/16/17   Doreatha Lew, MD  glipiZIDE (GLUCOTROL) 5 MG tablet Take 1 tablet (5 mg total) by mouth 2 (two) times daily before a meal. 03/30/18   Copland, Gay Filler, MD  Insulin Detemir (LEVEMIR) 100 UNIT/ML Pen Inject 24 units twice daily 04/04/18   Copland, Gay Filler, MD  Lancets (ONETOUCH ULTRASOFT) lancets USE AS DIRECTED TO TEST BLOOD SUGARS DAILY DX: E11.9 03/07/16   Saguier, Percell Miller, PA-C  lidocaine (LIDODERM) 5 % Place 1 patch onto the skin daily. Remove & Discard patch within 12 hours or as directed by MD 03/19/18   Lanny Cramp, Bea Graff, PA-C  losartan (COZAAR) 100 MG tablet Take 1 tablet (100 mg total) by mouth daily. 05/25/18   Copland, Gay Filler, MD  metFORMIN (GLUCOPHAGE-XR) 500 MG 24 hr tablet Take 4 tablets (2,000 mg total) by mouth daily. 07/15/16   Copland, Gay Filler, MD  methocarbamol (ROBAXIN) 500 MG tablet Take 1 tablet (500 mg total) by mouth every 8 (eight) hours as needed for muscle spasms. 06/05/18   Copland, Gay Filler, MD  oxyCODONE-acetaminophen (PERCOCET/ROXICET) 5-325 MG tablet Take 1-2 tablets by mouth every 6 (six) hours as needed for severe pain. 04/04/18   Copland, Gay Filler, MD  pioglitazone (ACTOS) 30 MG tablet Take 30 mg by mouth daily.    [provider]  polyethylene glycol (MIRALAX / GLYCOLAX) 17 g packet Take 17 g by mouth daily. 06/05/18   Copland, Gay Filler, MD  pregabalin (LYRICA) 75 MG capsule Take 1 capsule (75 mg total) by mouth daily as needed (for pain). 05/25/18   Copland, Gay Filler, MD  rosuvastatin (CRESTOR) 20 MG tablet Take 1 tablet (20 mg total) by mouth daily. 04/03/18   Copland, Gay Filler, MD  torsemide (DEMADEX) 20 MG tablet Take 0.5-1 tablets (10-20 mg total) by mouth daily. 06/05/18   Copland, Gay Filler, MD  traMADol (ULTRAM) 50 MG tablet TAKE 1 TABLET BY MOUTH EVERY 12 HOURS AS NEEDED FOR  ARTHRITIS  PAIN.  CAN  CAUSE  SEDATION 02/27/18   Copland, Gay Filler, MD    Family History Family History   Problem Relation Age of Onset  . Diabetes Mother   . Heart disease Mother   . Kidney failure Mother   . Stroke Father   . Kidney failure Father   . Breast cancer Other     Social History Social History   Tobacco Use  . Smoking status: Current Every Day Smoker    Packs/day: 0.50    Years: 23.00    Pack years: 11.50    Types: Cigarettes  . Smokeless tobacco: Never Used  Substance  Use Topics  . Alcohol use: Yes    Comment: occ  . Drug use: No     Allergies   Latex, Talc, Ace inhibitors, and Other   Review of Systems Review of Systems  All systems reviewed and negative, other than as noted in HPI.  Physical Exam Updated Vital Signs BP 127/74 (BP Location: Right Arm)   Pulse 85   Temp 99.4 F (37.4 C) (Oral)   Resp 20   Ht 5\' 3"  (1.6 m)   Wt 95.3 kg   LMP 05/09/2017 (LMP Unknown)   SpO2 100%   BMI 37.20 kg/m   Physical Exam Vitals signs and nursing note reviewed.  Constitutional:      General: She is not in acute distress.    Appearance: She is well-developed.  HENT:     Head: Normocephalic and atraumatic.  Eyes:     General:        Right eye: No discharge.        Left eye: No discharge.     Conjunctiva/sclera: Conjunctivae normal.     Comments: Mild bilateral periorbital swelling.  Conjunctive are clear.  Anterior chambers are clear.  Extraocular muscle function is intact.  Oropharynx clear.  Breath sounds clear and symmetric.  Normal sounding voice.  Neck is supple.  Neck:     Musculoskeletal: Neck supple.  Cardiovascular:     Rate and Rhythm: Normal rate and regular rhythm.     Heart sounds: Normal heart sounds. No murmur. No friction rub. No gallop.   Pulmonary:     Effort: Pulmonary effort is normal. No respiratory distress.     Breath sounds: Normal breath sounds.  Abdominal:     General: There is no distension.     Palpations: Abdomen is soft.     Tenderness: There is no abdominal tenderness.  Musculoskeletal:        General: No  tenderness.  Skin:    General: Skin is warm and dry.  Neurological:     Mental Status: She is alert.  Psychiatric:        Behavior: Behavior normal.        Thought Content: Thought content normal.      ED Treatments / Results  Labs (all labs ordered are listed, but only abnormal results are displayed) Labs Reviewed - No data to display  EKG None  Radiology No results found.  Procedures Procedures (including critical care time)  Medications Ordered in ED Medications - No data to display   Initial Impression / Assessment and Plan / ED Course  I have reviewed the triage vital signs and the nursing notes.  Pertinent labs & imaging results that were available during my care of the patient were reviewed by me and considered in my medical decision making (see chart for details).        48 year old female with bilateral periorbital edema and itching.  Continue as needed his antihistamines.  Will give a course of steroids.  No visual changes.  Return precautions were discussed.  Outpatient follow-up otherwise.  Prescription for EpiPen prescription was provided.  Discussed indications for usage.  Final Clinical Impressions(s) / ED Diagnoses   Final diagnoses:  Rash    ED Discharge Orders         Ordered    EPINEPHrine 0.3 mg/0.3 mL IJ SOAJ injection  As needed     10/24/18 1828    dexamethasone (DECADRON) 4 MG tablet  2 times daily     10/24/18 1828  Virgel Manifold, MD 10/26/18 [phone]

## 2018-10-30 ENCOUNTER — Other Ambulatory Visit: Payer: Self-pay

## 2018-10-30 ENCOUNTER — Encounter (HOSPITAL_BASED_OUTPATIENT_CLINIC_OR_DEPARTMENT_OTHER): Payer: Self-pay

## 2018-10-30 ENCOUNTER — Emergency Department (HOSPITAL_BASED_OUTPATIENT_CLINIC_OR_DEPARTMENT_OTHER)
Admission: EM | Admit: 2018-10-30 | Discharge: 2018-10-31 | Disposition: A | Discharge status: Home / Self Care | Payer: BLUE CROSS/BLUE SHIELD | Attending: Emergency Medicine | Admitting: Emergency Medicine

## 2018-10-30 DIAGNOSIS — J45909 Unspecified asthma, uncomplicated: Secondary | ICD-10-CM | POA: Insufficient documentation

## 2018-10-30 DIAGNOSIS — F17210 Nicotine dependence, cigarettes, uncomplicated: Secondary | ICD-10-CM | POA: Insufficient documentation

## 2018-10-30 DIAGNOSIS — Z79899 Other long term (current) drug therapy: Secondary | ICD-10-CM | POA: Diagnosis not present

## 2018-10-30 DIAGNOSIS — E785 Hyperlipidemia, unspecified: Secondary | ICD-10-CM | POA: Diagnosis not present

## 2018-10-30 DIAGNOSIS — N183 Chronic kidney disease, stage 3 unspecified: Secondary | ICD-10-CM | POA: Diagnosis not present

## 2018-10-30 DIAGNOSIS — I129 Hypertensive chronic kidney disease with stage 1 through stage 4 chronic kidney disease, or unspecified chronic kidney disease: Secondary | ICD-10-CM | POA: Insufficient documentation

## 2018-10-30 DIAGNOSIS — E1122 Type 2 diabetes mellitus with diabetic chronic kidney disease: Secondary | ICD-10-CM | POA: Insufficient documentation

## 2018-10-30 DIAGNOSIS — J029 Acute pharyngitis, unspecified: Secondary | ICD-10-CM | POA: Diagnosis present

## 2018-10-30 MED ORDER — AMOXICILLIN-POT CLAVULANATE 875-125 MG PO TABS: 1.0000 | ORAL_TABLET | Freq: Two times a day (BID) | ORAL | 0 refills | Status: AC

## 2018-10-30 MED ORDER — MAGIC MOUTHWASH W/LIDOCAINE: 5.0000 mL | Freq: Three times a day (TID) | ORAL | 0 refills | Status: AC

## 2018-10-30 MED ORDER — LIDOCAINE VISCOUS HCL 2 % MT SOLN
15.0000 mL | Freq: Once | OROMUCOSAL | Status: AC
  Administered 2018-10-30: 15 mL via OROMUCOSAL
  Filled 2018-10-30: qty 15

## 2018-10-30 MED ORDER — MAGIC MOUTHWASH
10.0000 mL | Freq: Once | ORAL | Status: DC
  Filled 2018-10-30: qty 10

## 2018-10-30 MED ORDER — KETOROLAC TROMETHAMINE 60 MG/2ML IM SOLN
30.0000 mg | Freq: Once | INTRAMUSCULAR | Status: DC
  Filled 2018-10-30: qty 2

## 2018-10-30 MED ORDER — AMOXICILLIN-POT CLAVULANATE 875-125 MG PO TABS
1.0000 | ORAL_TABLET | Freq: Once | ORAL | Status: AC
  Administered 2018-10-30: 1 via ORAL
  Filled 2018-10-30: qty 1

## 2018-10-30 MED ORDER — DEXAMETHASONE 10 MG/ML FOR PEDIATRIC ORAL USE
INTRAMUSCULAR | Status: AC
  Administered 2018-10-30: 10 mg
  Filled 2018-10-30: qty 1

## 2018-10-30 MED ORDER — DEXAMETHASONE 1 MG/ML PO CONC
10.0000 mg | Freq: Once | ORAL | Status: DC
  Filled 2018-10-30: qty 10

## 2018-10-30 NOTE — ED Triage Notes (Addendum)
Pt c/o sore throat, fever, HA x 2 days-NAD-steady gait-husband came in with pt stating he needed to talk for pt however pt was able to speak and answer ?s

## 2018-10-30 NOTE — ED Notes (Signed)
ED Provider at bedside. 

## 2018-10-30 NOTE — ED Provider Notes (Signed)
Emergency Department Provider Note   I have reviewed the triage vital signs and the nursing notes.   HISTORY  Chief Complaint Sore Throat   HPI Renee James is a 48 y.o. female who presents the emerge department today with sore throat for 2 days.  Patient states that she was recently treated with a prednisone for allergic reaction and then she started having pain in her throat Sunday is progressively worsened for the last 2 days.  Patient states she has had some subjective fevers and chills but nothing measured.  She has pain with swallowing but no difficulty swallowing.  No difficulty breathing.  She is handling secretions without difficulty.  She notices some swelling in her neck when she feels it.  No significant cough, sick contacts.   No other associated or modifying symptoms.    Past Medical History:  Diagnosis Date  . Allergy   . Anxiety   . Arthritis   . Asthma   . Bell's palsy   . Bell's palsy 2012  . Chronic kidney disease (CKD), stage III (moderate)   . Depression   . Diabetes mellitus without complication ([place])   . Diabetic neuropathy ([place])   . Headache    migraines  . Hyperlipidemia   . Hypertension   . Pneumonia   . Renal insufficiency 05/13/2015    Patient Active Problem List   Diagnosis Date Noted  . Acute pancreatitis 08/13/2017  . Asthma 08/13/2017  . Depression 08/13/2017  . Diabetic neuropathy ([place]) 08/13/2017  . Tobacco use 08/13/2017  . Type 2 diabetes mellitus ([place]) 08/13/2017  . 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 Post-operative state 05/09/2017  . Right ovarian cyst 03/07/2017  . Fibroids 03/07/2017  . Abnormal uterine bleeding (AUB) 02/22/2017  . CKD (chronic kidney disease) stage 3, GFR 30-59 ml/min (HCC) 05/16/2016  . Renal insufficiency 05/13/2015  . Ovarian torsion 04/28/2015  . Pain   . Hyperlipidemia 03/19/2015  . Obesity, unspecified 03/19/2015  . Uncontrolled type 2 diabetes mellitus without complication, without  long-term current use of insulin 03/19/2015  . Essential hypertension 03/19/2015  . GAD (generalized anxiety disorder) 03/19/2015    Past Surgical History:  Procedure Laterality Date  . ABDOMINAL HYSTERECTOMY  05/09/2017  . ECTOPIC PREGNANCY SURGERY    . LAPAROSCOPY N/A 04/28/2015   Procedure: LAPAROSCOPY DIAGNOSTIC;  Surgeon: Donnamae Jude, MD;  Location: [facility];  Service: Gynecology;  Laterality: N/A;  . OVARIAN CYST REMOVAL    . ROBOTIC ASSISTED LAPAROSCOPIC HYSTERECTOMY AND SALPINGECTOMY Bilateral 05/09/2017   Procedure: XI ROBOTIC ASSISTED LAPAROSCOPIC HYSTERECTOMY AND Left SALPINGECTOMY, Lysis of Adhesions, cystoscopy;  Surgeon: Lavonia Drafts, MD;  Location: [facility];  Service: Gynecology;  Laterality: Bilateral;  . TUBAL LIGATION      Current Outpatient Rx  . Order #: [id number] Class: Normal  . Order #: [id number] Class: Normal  . Order #: [id number] Class: Print  . Order #: [id number] Class: Normal  . Order #: [id number] Class: Print  . Order #: [id number] Class: Print  . Order #: [id number] Class: Historical Med  . Order #: [id number] Class: Normal  . Order #: [id number] Class: Normal  . Order #: [id number] Class: Normal  . Order #: [id number] Class: Phone In  . Order #: [id number] Class: Normal  . Order #: [id number] Class: Normal  . Order #: [id number] Class: Print  . Order #: [id number] Class: Normal  . Order #: [id number] Class: Normal  . Order #: [id number] Class: Normal  . Order #: [id number] Class: Historical Med  . Order #: [id number] Class: Normal  . Order #: [id number] Class: Print  . Order #:  [id number] Class: Normal  . Order #: [id number] Class: Normal  . Order #: [id number] Class: Normal    Allergies Latex, Talc, Ace inhibitors, and Other  Family History  Problem Relation Age of Onset  . Diabetes Mother   . Heart disease Mother   . Kidney failure Mother   . Stroke Father   . Kidney failure Father   . Breast cancer Other     Social History Social History   Tobacco Use  .  Smoking status: Current Every Day Smoker    Packs/day: 0.50    Years: 23.00    Pack years: 11.50    Types: Cigarettes  . Smokeless tobacco: Never Used  Substance Use Topics  . Alcohol use: Yes    Comment: occ  . Drug use: No    Review of Systems  All other systems negative except as documented in the HPI. All pertinent positives and negatives as reviewed in the HPI. ____________________________________________   PHYSICAL EXAM:  VITAL SIGNS: ED Triage Vitals  Enc Vitals Group     BP 10/30/18 2032 134/82     Pulse Rate 10/30/18 2032 65     Resp 10/30/18 2032 (!) 22     Temp 10/30/18 2032 99.2 F (37.3 C)     Temp Source 10/30/18 2032 Oral     SpO2 10/30/18 2032 99 %     Weight 10/30/18 2032 241 lb (109.3 kg)     Height 10/30/18 2032 5\' 3"  (1.6 m)     Head Circumference --      Peak Flow --      Pain Score 10/30/18 2030 8     Pain Loc --      Pain Edu? --      Excl. in [place]? --     Constitutional: Alert and oriented. Well appearing and in no acute distress. Eyes: Conjunctivae are normal. PERRL. EOMI. Head: Atraumatic. Nose: No congestion/rhinnorhea. Mouth/Throat: Mucous membranes are moist.  Oropharynx erythematous with yellowish to white plaques to have a red base.  No trismus.  Airway patent. Neck: No stridor.  No meningeal signs.  Mildly tender but no obvious lymph nodes. Cardiovascular: Normal rate, regular rhythm. Good peripheral circulation. Grossly normal heart sounds.   Respiratory: Normal respiratory effort.  No retractions. Lungs CTAB. Gastrointestinal: Soft and nontender. No distention.  Musculoskeletal: No lower extremity tenderness nor edema. No gross deformities of extremities. Neurologic:  Normal speech and language. No gross focal neurologic deficits are appreciated.  Skin:  Skin is warm, dry and intact. No rash noted.   ____________________________________________   LABS (all labs ordered are listed, but only abnormal results are displayed)  Labs  Reviewed  GROUP A STREP BY PCR   ____________________________________________  INITIAL IMPRESSION / ASSESSMENT AND PLAN / ED COURSE  Strep versus candidal infection.  Will treat for both.  Decadron to reduce edema.  Protecting airway no concern for demise at this time but return precautions provided regarding the same.  Pertinent labs & imaging results that were available during my care of the patient were reviewed by me and considered in my medical decision making (see chart for details).   A medical screening exam was performed and I feel the patient has had an appropriate workup for their chief complaint at this time and likelihood of emergent condition existing is low. They have been counseled on decision, discharge, follow up and which symptoms necessitate immediate return to the emergency department. They or their family verbally stated understanding and agreement with plan and discharged  in stable condition.   ____________________________________________  FINAL CLINICAL IMPRESSION(S) / ED DIAGNOSES  Final diagnoses:  Pharyngitis, unspecified etiology     MEDICATIONS GIVEN DURING THIS VISIT:  Medications  dexamethasone (DECADRON) 1 MG/ML solution 10 mg (has no administration in time range)  amoxicillin-clavulanate (AUGMENTIN) 875-125 MG per tablet 1 tablet (has no administration in time range)  lidocaine (XYLOCAINE) 2 % viscous mouth solution 15 mL (has no administration in time range)  magic mouthwash (has no administration in time range)  ketorolac (TORADOL) injection 30 mg (has no administration in time range)     NEW OUTPATIENT MEDICATIONS STARTED DURING THIS VISIT:  New Prescriptions   AMOXICILLIN-CLAVULANATE (AUGMENTIN) 875-125 MG TABLET    Take 1 tablet by mouth 2 (two) times daily. One po bid x 7 days   MAGIC MOUTHWASH W/LIDOCAINE SOLN    Take 5 mLs by mouth 3 (three) times daily for 7 days.    Note:  This note was prepared with assistance of Dragon voice  recognition software. Occasional wrong-word or sound-a-like substitutions may have occurred due to the inherent limitations of voice recognition software.   Merrily Pew, MD 10/30/18 [phone]

## 2018-10-31 LAB — GROUP A STREP BY PCR: Group A Strep by PCR: NOT DETECTED

## 2018-11-12 ENCOUNTER — Other Ambulatory Visit: Payer: Self-pay | Admitting: Family Medicine

## 2018-11-12 DIAGNOSIS — I10 Essential (primary) hypertension: Secondary | ICD-10-CM

## 2019-03-19 ENCOUNTER — Other Ambulatory Visit: Payer: Self-pay

## 2019-03-19 ENCOUNTER — Emergency Department (HOSPITAL_BASED_OUTPATIENT_CLINIC_OR_DEPARTMENT_OTHER): Payer: BLUE CROSS/BLUE SHIELD

## 2019-03-19 ENCOUNTER — Encounter (HOSPITAL_BASED_OUTPATIENT_CLINIC_OR_DEPARTMENT_OTHER): Payer: Self-pay

## 2019-03-19 ENCOUNTER — Emergency Department (HOSPITAL_BASED_OUTPATIENT_CLINIC_OR_DEPARTMENT_OTHER)
Admission: EM | Admit: 2019-03-19 | Discharge: 2019-03-19 | Disposition: A | Discharge status: Home / Self Care | Payer: BLUE CROSS/BLUE SHIELD | Attending: Emergency Medicine | Admitting: Emergency Medicine

## 2019-03-19 DIAGNOSIS — R1110 Vomiting, unspecified: Secondary | ICD-10-CM | POA: Diagnosis not present

## 2019-03-19 DIAGNOSIS — H538 Other visual disturbances: Secondary | ICD-10-CM | POA: Insufficient documentation

## 2019-03-19 DIAGNOSIS — N1832 Chronic kidney disease, stage 3b: Secondary | ICD-10-CM | POA: Diagnosis not present

## 2019-03-19 DIAGNOSIS — R519 Headache, unspecified: Secondary | ICD-10-CM | POA: Insufficient documentation

## 2019-03-19 DIAGNOSIS — F17210 Nicotine dependence, cigarettes, uncomplicated: Secondary | ICD-10-CM | POA: Diagnosis not present

## 2019-03-19 DIAGNOSIS — H53149 Visual discomfort, unspecified: Secondary | ICD-10-CM | POA: Diagnosis not present

## 2019-03-19 DIAGNOSIS — I129 Hypertensive chronic kidney disease with stage 1 through stage 4 chronic kidney disease, or unspecified chronic kidney disease: Secondary | ICD-10-CM | POA: Diagnosis not present

## 2019-03-19 DIAGNOSIS — M542 Cervicalgia: Secondary | ICD-10-CM | POA: Diagnosis present

## 2019-03-19 DIAGNOSIS — Z794 Long term (current) use of insulin: Secondary | ICD-10-CM | POA: Insufficient documentation

## 2019-03-19 DIAGNOSIS — Z91040 Latex allergy status: Secondary | ICD-10-CM | POA: Diagnosis not present

## 2019-03-19 DIAGNOSIS — E1140 Type 2 diabetes mellitus with diabetic neuropathy, unspecified: Secondary | ICD-10-CM | POA: Diagnosis not present

## 2019-03-19 DIAGNOSIS — Z79899 Other long term (current) drug therapy: Secondary | ICD-10-CM | POA: Diagnosis not present

## 2019-03-19 DIAGNOSIS — M436 Torticollis: Secondary | ICD-10-CM | POA: Insufficient documentation

## 2019-03-19 DIAGNOSIS — E1122 Type 2 diabetes mellitus with diabetic chronic kidney disease: Secondary | ICD-10-CM | POA: Insufficient documentation

## 2019-03-19 LAB — CBC WITH DIFFERENTIAL/PLATELET
Abs Immature Granulocytes: 0.03 10*3/uL (ref 0.00–0.07)
Basophils Absolute: 0 10*3/uL (ref 0.0–0.1)
Basophils Relative: 0 %
Eosinophils Absolute: 0.3 10*3/uL (ref 0.0–0.5)
Eosinophils Relative: 3 %
HCT: 40.1 % (ref 36.0–46.0)
Hemoglobin: 12.7 g/dL (ref 12.0–15.0)
Immature Granulocytes: 0 %
Lymphocytes Relative: 24 %
Lymphs Abs: 2.1 10*3/uL (ref 0.7–4.0)
MCH: 27.9 pg (ref 26.0–34.0)
MCHC: 31.7 g/dL (ref 30.0–36.0)
MCV: 87.9 fL (ref 80.0–100.0)
Monocytes Absolute: 0.6 10*3/uL (ref 0.1–1.0)
Monocytes Relative: 6 %
Neutro Abs: 5.8 10*3/uL (ref 1.7–7.7)
Neutrophils Relative %: 67 %
Platelets: 316 10*3/uL (ref 150–400)
RBC: 4.56 MIL/uL (ref 3.87–5.11)
RDW: 15.6 % — ABNORMAL HIGH (ref 11.5–15.5)
WBC: 8.8 10*3/uL (ref 4.0–10.5)
nRBC: 0 % (ref 0.0–0.2)

## 2019-03-19 LAB — BASIC METABOLIC PANEL
Anion gap: 10 (ref 5–15)
BUN: 21 mg/dL — ABNORMAL HIGH (ref 6–20)
CO2: 30 mmol/L (ref 22–32)
Calcium: 9 mg/dL (ref 8.9–10.3)
Chloride: 100 mmol/L (ref 98–111)
Creatinine, Ser: 1.72 mg/dL — ABNORMAL HIGH (ref 0.44–1.00)
GFR calc Af Amer: 39 mL/min — ABNORMAL LOW (ref >60)
GFR calc non Af Amer: 34 mL/min — ABNORMAL LOW (ref >60)
Glucose, Bld: 159 mg/dL — ABNORMAL HIGH (ref 70–99)
Potassium: 3.6 mmol/L (ref 3.5–5.1)
Sodium: 140 mmol/L (ref 135–145)

## 2019-03-19 MED ORDER — DEXAMETHASONE SODIUM PHOSPHATE 10 MG/ML IJ SOLN
10.0000 mg | Freq: Once | INTRAMUSCULAR | Status: AC
  Administered 2019-03-19: 10 mg via INTRAVENOUS
  Filled 2019-03-19: qty 1

## 2019-03-19 MED ORDER — ONDANSETRON HCL 4 MG/2ML IJ SOLN
4.0000 mg | Freq: Once | INTRAMUSCULAR | Status: AC
  Administered 2019-03-19: 17:00:00 4 mg via INTRAVENOUS
  Filled 2019-03-19: qty 2

## 2019-03-19 MED ORDER — METOCLOPRAMIDE HCL 5 MG/ML IJ SOLN
10.0000 mg | Freq: Once | INTRAMUSCULAR | Status: AC
  Administered 2019-03-19: 10 mg via INTRAVENOUS
  Filled 2019-03-19: qty 2

## 2019-03-19 MED ORDER — HYDROMORPHONE HCL 1 MG/ML IJ SOLN
1.0000 mg | Freq: Once | INTRAMUSCULAR | Status: AC
  Administered 2019-03-19: 1 mg via INTRAVENOUS
  Filled 2019-03-19: qty 1

## 2019-03-19 MED ORDER — DIPHENHYDRAMINE HCL 50 MG/ML IJ SOLN
25.0000 mg | Freq: Once | INTRAMUSCULAR | Status: AC
  Administered 2019-03-19: 25 mg via INTRAVENOUS
  Filled 2019-03-19: qty 1

## 2019-03-19 MED ORDER — HYDROCODONE-ACETAMINOPHEN 5-325 MG PO TABS: 1.0000 | ORAL_TABLET | Freq: Four times a day (QID) | ORAL | 0 refills | Status: AC | PRN

## 2019-03-19 MED ORDER — SODIUM CHLORIDE 0.9 % IV SOLN: INTRAVENOUS | Status: DC

## 2019-03-19 MED ORDER — SODIUM CHLORIDE 0.9 % IV BOLUS
1000.0000 mL | Freq: Once | INTRAVENOUS | Status: AC
  Administered 2019-03-19: 17:00:00 1000 mL via INTRAVENOUS

## 2019-03-19 NOTE — ED Notes (Signed)
Patient transported to CT 

## 2019-03-19 NOTE — Discharge Instructions (Signed)
Take the pain medicine as needed.  Rest as much as possible.  Your kidney function here today was a little bit worse than it was a year ago.  Recommend you follow back up with your primary care provider or the [facility] walk-in clinic for further evaluation of this in about a week or 2.  CT of head and neck without any acute findings.

## 2019-03-19 NOTE — ED Triage Notes (Signed)
Pt reports that she has had left sided neck pain and headache with some blurred vision X2-3 days. Reports using muscle relaxer at home last night with no relief. Pt Has been vomiting since 3 pm today.

## 2019-03-19 NOTE — ED Provider Notes (Addendum)
[facility] EMERGENCY DEPARTMENT Provider Note   CSN: [id number] Arrival date & time: 03/19/19  1615     History Chief Complaint  Patient presents with  . Neck Pain    Renee James is a 49 y.o. female.  Patient sent in from [facility].  Patient stated that on Sunday evening she started with some soreness to the left side of her neck.  By Monday morning the neck was difficult to move her head towards the left side.  Later that day she started develop a left-sided headache.  Was some mild blurred vision and some light sensitivity.  Patient tried muscle relaxer at home without any help.  Patient had some episodes of vomiting today.  Denies any fevers.  Denies history of migraines but states she has had history of headaches but her past medical history does list migraines.  She is also had Bell's palsy before and said last time she had pain like this she went on to develop Bell's palsy.  Other important past medical history includes stage III moderate chronic kidney disease diabetes hypertension hyperlipidemia and diabetic neuropathy.  Patient denies any stiffness to the back of the neck.  The headache seems to come from the left side of the lateral aspect of neck and moves up.  Denies any hearing changes.  Denies any upper extremity weakness or numbness.  In addition patient denies any sore throat or difficulty swallowing.        Past Medical History:  Diagnosis Date  . Allergy   . Anxiety   . Arthritis   . Asthma   . Bell's palsy   . Bell's palsy 2012  . Chronic kidney disease (CKD), stage III (moderate)   . Depression   . Diabetes mellitus without complication ([place])   . Diabetic neuropathy ([place])   . Headache    migraines  . Hyperlipidemia   . Hypertension   . Pneumonia   . Renal insufficiency 05/13/2015    Patient Active Problem List   Diagnosis Date Noted  . Acute pancreatitis 08/13/2017  . Asthma 08/13/2017  . Depression 08/13/2017  . Diabetic  neuropathy ([place]) 08/13/2017  . Tobacco use 08/13/2017  . Type 2 diabetes mellitus ([place]) 08/13/2017  . 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 Post-operative state 05/09/2017  . Right ovarian cyst 03/07/2017  . Fibroids 03/07/2017  . Abnormal uterine bleeding (AUB) 02/22/2017  . CKD (chronic kidney disease) stage 3, GFR 30-59 ml/min ([facility]) 05/16/2016  . Renal insufficiency 05/13/2015  . Ovarian torsion 04/28/2015  . Pain   . Hyperlipidemia 03/19/2015  . Obesity, unspecified 03/19/2015  . Uncontrolled type 2 diabetes mellitus without complication, without long-term current use of insulin 03/19/2015  . Essential hypertension 03/19/2015  . GAD (generalized anxiety disorder) 03/19/2015    Past Surgical History:  Procedure Laterality Date  . ABDOMINAL HYSTERECTOMY  05/09/2017  . ECTOPIC PREGNANCY SURGERY    . LAPAROSCOPY N/A 04/28/2015   Procedure: LAPAROSCOPY DIAGNOSTIC;  Surgeon: Donnamae Jude, MD;  Location: [facility];  Service: Gynecology;  Laterality: N/A;  . OVARIAN CYST REMOVAL    . ROBOTIC ASSISTED LAPAROSCOPIC HYSTERECTOMY AND SALPINGECTOMY Bilateral 05/09/2017   Procedure: XI ROBOTIC ASSISTED LAPAROSCOPIC HYSTERECTOMY AND Left SALPINGECTOMY, Lysis of Adhesions, cystoscopy;  Surgeon: Lavonia Drafts, MD;  Location: [facility];  Service: Gynecology;  Laterality: Bilateral;  . TUBAL LIGATION       OB History    Gravida  5   Para      Term  Preterm      AB  2   Living  3     SAB      TAB  1   Ectopic  1   Multiple      Live Births              Family History  Problem Relation Age of Onset  . Diabetes Mother   . Heart disease Mother   . Kidney failure Mother   . Stroke Father   . Kidney failure Father   . Breast cancer Other     Social History   Tobacco Use  . Smoking status: Current Every Day Smoker    Packs/day: 0.50    Years: 23.00    Pack years: 11.50    Types: Cigarettes  . Smokeless tobacco: Never Used  Substance Use Topics    . Alcohol use: Yes    Comment: occ  . Drug use: No    Home Medications Prior to Admission medications   Medication Sig Start Date End Date Taking? Authorizing Provider  albuterol (PROVENTIL HFA;VENTOLIN HFA) 108 (90 Base) MCG/ACT inhaler Inhale 1-2 puffs into the lungs every 6 (six) hours as needed for wheezing or shortness of breath. 08/29/16   Copland, Gay Filler, MD  ALPRAZolam (XANAX) 0.25 MG tablet Take 1 tablet (0.25 mg total) by mouth at bedtime as needed for anxiety or sleep. 08/21/17   Copland, Gay Filler, MD  amoxicillin-clavulanate (AUGMENTIN) 875-125 MG tablet Take 1 tablet by mouth 2 (two) times daily. One po bid x 7 days 10/30/18   Mesner, Corene Cornea, MD  citalopram (CELEXA) 20 MG tablet Take 1 tablet (20 mg total) by mouth daily. 06/14/17   Copland, Gay Filler, MD  dexamethasone (DECADRON) 4 MG tablet Take 1 tablet (4 mg total) by mouth 2 (two) times daily. 10/24/18   Virgel Manifold, MD  EPINEPHrine 0.3 mg/0.3 mL IJ SOAJ injection Inject 0.3 mLs (0.3 mg total) into the muscle as needed for anaphylaxis. 10/24/18   Virgel Manifold, MD  ergocalciferol (VITAMIN D2) 50000 units capsule Take 50,000 Units by mouth every Monday.    [provider]  famotidine (PEPCID) 20 MG tablet Take 1 tablet (20 mg total) by mouth 2 (two) times daily. 08/16/17   Doreatha Lew, MD  glipiZIDE (GLUCOTROL) 5 MG tablet Take 1 tablet (5 mg total) by mouth 2 (two) times daily before a meal. 03/30/18   Copland, Gay Filler, MD  HYDROcodone-acetaminophen (NORCO/VICODIN) 5-325 MG tablet Take 1 tablet by mouth every 6 (six) hours as needed for moderate pain. 03/19/19   Fredia Sorrow, MD  Insulin Detemir (LEVEMIR) 100 UNIT/ML Pen Inject 24 units twice daily 04/04/18   Copland, Gay Filler, MD  Lancets [facility] ULTRASOFT) lancets USE AS DIRECTED TO TEST BLOOD SUGARS DAILY DX: E11.9 03/07/16   Saguier, Percell Miller, PA-C  lidocaine (LIDODERM) 5 % Place 1 patch onto the skin daily. Remove & Discard patch within 12 hours or as  directed by MD 03/19/18   Lanny Cramp, Bea Graff, PA-C  losartan (COZAAR) 100 MG tablet Take 1 tablet (100 mg total) by mouth daily. 05/25/18   Copland, Gay Filler, MD  magic mouthwash w/lidocaine SOLN Take 5 mLs by mouth 3 (three) times daily for 7 days. 10/30/18 11/06/18  Mesner, Corene Cornea, MD  metFORMIN (GLUCOPHAGE-XR) 500 MG 24 hr tablet Take 4 tablets (2,000 mg total) by mouth daily. 07/15/16   Copland, Gay Filler, MD  methocarbamol (ROBAXIN) 500 MG tablet Take 1 tablet (500 mg total) by mouth  every 8 (eight) hours as needed for muscle spasms. 06/05/18   Copland, Gay Filler, MD  oxyCODONE-acetaminophen (PERCOCET/ROXICET) 5-325 MG tablet Take 1-2 tablets by mouth every 6 (six) hours as needed for severe pain. 04/04/18   Copland, Gay Filler, MD  pioglitazone (ACTOS) 30 MG tablet Take 30 mg by mouth daily.    [provider]  polyethylene glycol (MIRALAX / GLYCOLAX) 17 g packet Take 17 g by mouth daily. 06/05/18   Copland, Gay Filler, MD  pregabalin (LYRICA) 75 MG capsule Take 1 capsule (75 mg total) by mouth daily as needed (for pain). 05/25/18   Copland, Gay Filler, MD  rosuvastatin (CRESTOR) 20 MG tablet Take 1 tablet (20 mg total) by mouth daily. 04/03/18   Copland, Gay Filler, MD  torsemide (DEMADEX) 20 MG tablet Take 0.5-1 tablets (10-20 mg total) by mouth daily. 06/05/18   Copland, Gay Filler, MD  traMADol (ULTRAM) 50 MG tablet TAKE 1 TABLET BY MOUTH EVERY 12 HOURS AS NEEDED FOR  ARTHRITIS  PAIN.  CAN  CAUSE  SEDATION 02/27/18   Copland, Gay Filler, MD    Allergies    Latex, Talc, Bupropion, Topiramate, Ace inhibitors, and Other  Review of Systems   Review of Systems  Constitutional: Negative for chills and fever.  HENT: Negative for congestion, rhinorrhea, sore throat and trouble swallowing.   Eyes: Positive for photophobia and visual disturbance.  Respiratory: Negative for cough and shortness of breath.   Cardiovascular: Negative for chest pain and leg swelling.  Gastrointestinal: Positive for nausea and  vomiting. Negative for abdominal pain and diarrhea.  Genitourinary: Negative for dysuria.  Musculoskeletal: Positive for neck pain. Negative for back pain.  Skin: Negative for rash.  Neurological: Positive for headaches. Negative for dizziness and light-headedness.  Hematological: Does not bruise/bleed easily.  Psychiatric/Behavioral: Negative for confusion.    Physical Exam Updated Vital Signs BP 112/73 (BP Location: Right Arm)   Pulse 70   Temp 98.7 F (37.1 C) (Oral)   Resp 18   Ht 1.6 m (5\' 3" )   Wt 106.1 kg   LMP 05/09/2017 (LMP Unknown)   SpO2 100%   BMI 41.45 kg/m   Physical Exam Vitals and nursing note reviewed.  Constitutional:      General: She is not in acute distress.    Appearance: She is well-developed.  HENT:     Head: Normocephalic and atraumatic.     Comments: No facial weakness.    Left Ear: Ear canal normal.  Eyes:     Extraocular Movements: Extraocular movements intact.     Conjunctiva/sclera: Conjunctivae normal.     Pupils: Pupils are equal, round, and reactive to light.  Neck:     Comments: Tenderness to palpation to lateral aspect of the left neck.  Decreased range of motion to the left side. Cardiovascular:     Rate and Rhythm: Normal rate and regular rhythm.     Heart sounds: No murmur.  Pulmonary:     Effort: Pulmonary effort is normal. No respiratory distress.     Breath sounds: Normal breath sounds.  Abdominal:     Palpations: Abdomen is soft.     Tenderness: There is no abdominal tenderness.  Musculoskeletal:        General: Normal range of motion.     Cervical back: Neck supple. Tenderness present.  Lymphadenopathy:     Cervical: No cervical adenopathy.  Skin:    General: Skin is warm and dry.     Capillary Refill: Capillary refill takes  less than 2 seconds.  Neurological:     General: No focal deficit present.     Mental Status: She is alert and oriented to person, place, and time.     Cranial Nerves: No cranial nerve deficit.       Sensory: No sensory deficit.     Motor: No weakness.     Comments: No evidence of any facial weakness.     ED Results / Procedures / Treatments   Labs (all labs ordered are listed, but only abnormal results are displayed) Labs Reviewed  CBC WITH DIFFERENTIAL/PLATELET - Abnormal; Notable for the following components:      Result Value   RDW 15.6 (*)    All other components within normal limits  BASIC METABOLIC PANEL - Abnormal; Notable for the following components:   Glucose, Bld 159 (*)    BUN 21 (*)    Creatinine, Ser 1.72 (*)    GFR calc non Af Amer 34 (*)    GFR calc Af Amer 39 (*)    All other components within normal limits    EKG None  Radiology CT Head Wo Contrast  Result Date: 03/19/2019 CLINICAL DATA:  Acute pain. Left-sided neck pain and headache with some blurred vision times 2-3 days. EXAM: CT HEAD WITHOUT CONTRAST CT CERVICAL SPINE WITHOUT CONTRAST TECHNIQUE: Multidetector CT imaging of the head and cervical spine was performed following the standard protocol without intravenous contrast. Multiplanar CT image reconstructions of the cervical spine were also generated. COMPARISON:  None. FINDINGS: CT HEAD FINDINGS Brain: No evidence of acute infarction, hemorrhage, hydrocephalus, extra-axial collection or mass lesion/mass effect. Vascular: No hyperdense vessel or unexpected calcification. Skull: Normal. Negative for fracture or focal lesion. Sinuses/Orbits: No acute finding. Other: None. CT CERVICAL SPINE FINDINGS Alignment: There is straightening of the normal cervical lordotic curvature. Skull base and vertebrae: No acute fracture. No primary bone lesion or focal pathologic process. Soft tissues and spinal canal: No prevertebral fluid or swelling. No visible canal hematoma. Disc levels: The disc heights are relatively well preserved. There may be some mild disc height loss at the lower cervical segments. Upper chest: Negative. Other: None IMPRESSION: 1. No acute  intracranial abnormality. No fracture identified. 2. Straightening of the normal cervical lordotic curvature. This could be due to patient positioning or muscle spasm. Electronically Signed   By: Constance Holster M.D.   On: 03/19/2019 18:47   CT Cervical Spine Wo Contrast  Result Date: 03/19/2019 CLINICAL DATA:  Acute pain. Left-sided neck pain and headache with some blurred vision times 2-3 days. EXAM: CT HEAD WITHOUT CONTRAST CT CERVICAL SPINE WITHOUT CONTRAST TECHNIQUE: Multidetector CT imaging of the head and cervical spine was performed following the standard protocol without intravenous contrast. Multiplanar CT image reconstructions of the cervical spine were also generated. COMPARISON:  None. FINDINGS: CT HEAD FINDINGS Brain: No evidence of acute infarction, hemorrhage, hydrocephalus, extra-axial collection or mass lesion/mass effect. Vascular: No hyperdense vessel or unexpected calcification. Skull: Normal. Negative for fracture or focal lesion. Sinuses/Orbits: No acute finding. Other: None. CT CERVICAL SPINE FINDINGS Alignment: There is straightening of the normal cervical lordotic curvature. Skull base and vertebrae: No acute fracture. No primary bone lesion or focal pathologic process. Soft tissues and spinal canal: No prevertebral fluid or swelling. No visible canal hematoma. Disc levels: The disc heights are relatively well preserved. There may be some mild disc height loss at the lower cervical segments. Upper chest: Negative. Other: None IMPRESSION: 1. No acute intracranial abnormality. No fracture identified.  2. Straightening of the normal cervical lordotic curvature. This could be due to patient positioning or muscle spasm. Electronically Signed   By: Constance Holster M.D.   On: 03/19/2019 18:47    Procedures Procedures (including critical care time)  Medications Ordered in ED Medications  0.9 %  sodium chloride infusion ( Intravenous New Bag/Given 03/19/19 1857)  ondansetron  (ZOFRAN) injection 4 mg (4 mg Intravenous Given 03/19/19 1716)  HYDROmorphone (DILAUDID) injection 1 mg (1 mg Intravenous Given 03/19/19 1719)  sodium chloride 0.9 % bolus 1,000 mL ( Intravenous Stopped 03/19/19 1855)  dexamethasone (DECADRON) injection 10 mg (10 mg Intravenous Given 03/19/19 1729)  diphenhydrAMINE (BENADRYL) injection 25 mg (25 mg Intravenous Given 03/19/19 1717)  metoCLOPramide (REGLAN) injection 10 mg (10 mg Intravenous Given 03/19/19 1729)    ED Course  I have reviewed the triage vital signs and the nursing notes.  Pertinent labs & imaging results that were available during my care of the patient were reviewed by me and considered in my medical decision making (see chart for details).    MDM Rules/Calculators/A&P                     Patient symptoms suggestive of torticollis to the left side of the neck.  That then triggered a headache.  Patient's past medical history does mention migraines.  Patient treated here with migraine cocktail as well as hydromorphone and Zofran.  IV fluids headache completely resolved.  Visual changes completely resolved.  And the neck pain is still present but she has much better range of motion.  CT head and neck without any acute findings.  Patient is known to have chronic kidney disease.  Has not had any blood work in our system since about a year ago.  There is a slight worsening of her renal disease.  We will have her follow-up with her primary care doctor for that.  We will treat her with hydrocodone for the pain.  And rest.  Final Clinical Impression(s) / ED Diagnoses Final diagnoses:  Torticollis  Stage 3b chronic kidney disease  Acute nonintractable headache, unspecified headache type    Rx / DC Orders ED Discharge Orders         Ordered    HYDROcodone-acetaminophen (NORCO/VICODIN) 5-325 MG tablet  Every 6 hours PRN     03/19/19 1905           Fredia Sorrow, MD 03/19/19 1904    Fredia Sorrow, MD 03/19/19 1907

## 2019-05-07 ENCOUNTER — Other Ambulatory Visit: Payer: Self-pay | Admitting: Family Medicine

## 2019-05-07 DIAGNOSIS — E785 Hyperlipidemia, unspecified: Secondary | ICD-10-CM

## 2019-05-08 MED ORDER — ROSUVASTATIN CALCIUM 20 MG PO TABS: 20.0000 mg | ORAL_TABLET | Freq: Every day | ORAL | 0 refills | Status: AC

## 2019-05-08 NOTE — Telephone Encounter (Signed)
30 day supply of Crestor sent to pharmacy. Last OV 03/2018 and OV is needed for greater quantities / refills. Mychart message sent to pt.

## 2019-05-10 ENCOUNTER — Other Ambulatory Visit: Payer: Self-pay

## 2019-07-15 IMAGING — DX DG LUMBAR SPINE COMPLETE 4+V
5 series · 5 of 5 positions shown · non-contrast
Comparison: CT scan of April 28, 2015.

CLINICAL DATA: Acute lower back pain for 1 week without known
injury.

EXAM:
LUMBAR SPINE - COMPLETE 4+ VIEW

[l-spine ap]
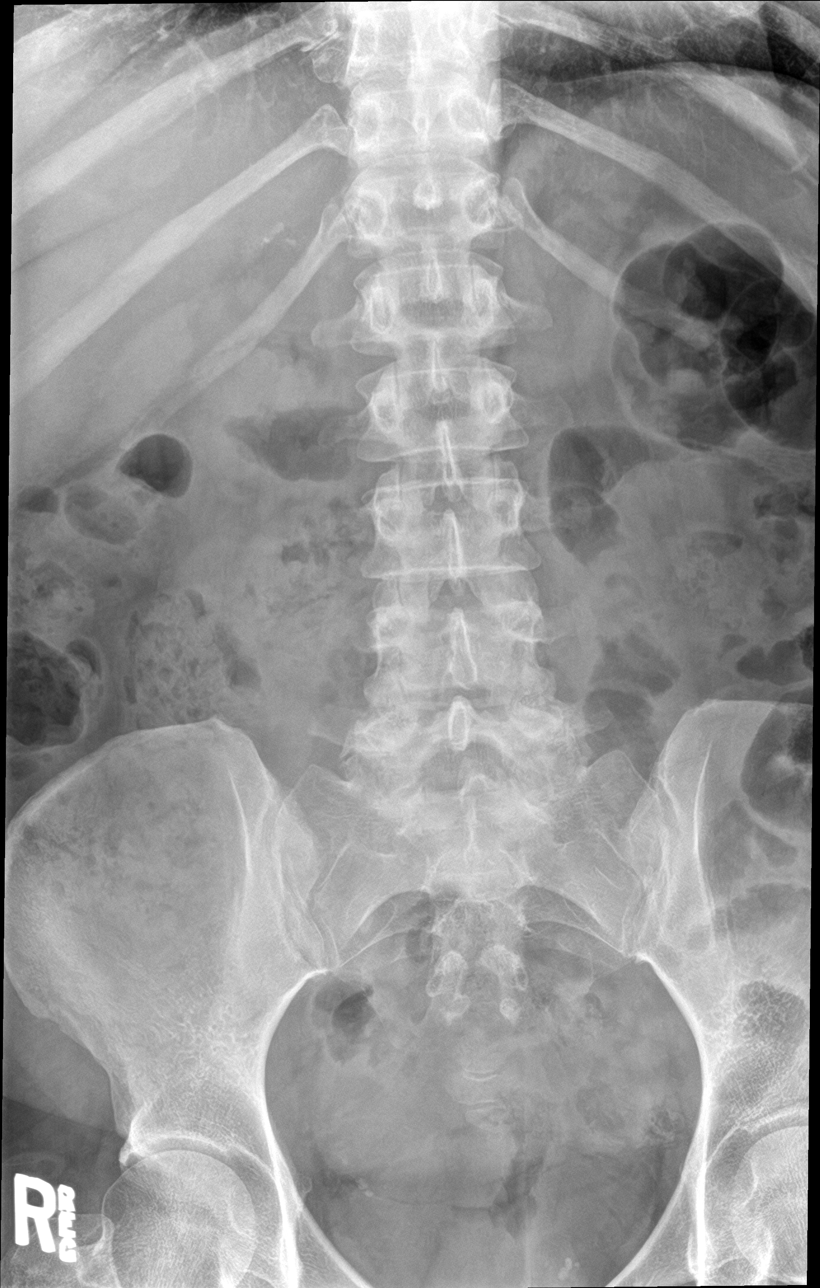

[l-spine obl (1 of 2)]
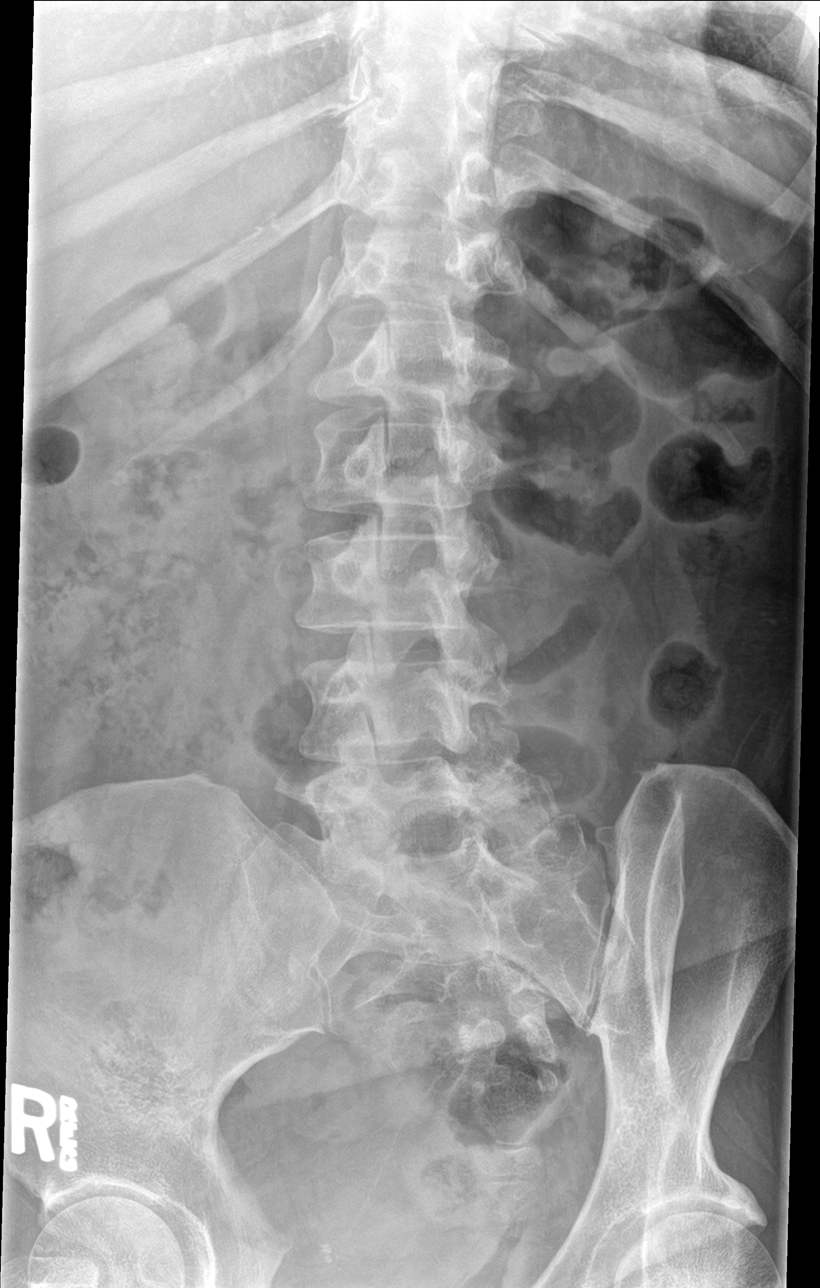

[l-spine obl (2 of 2)]
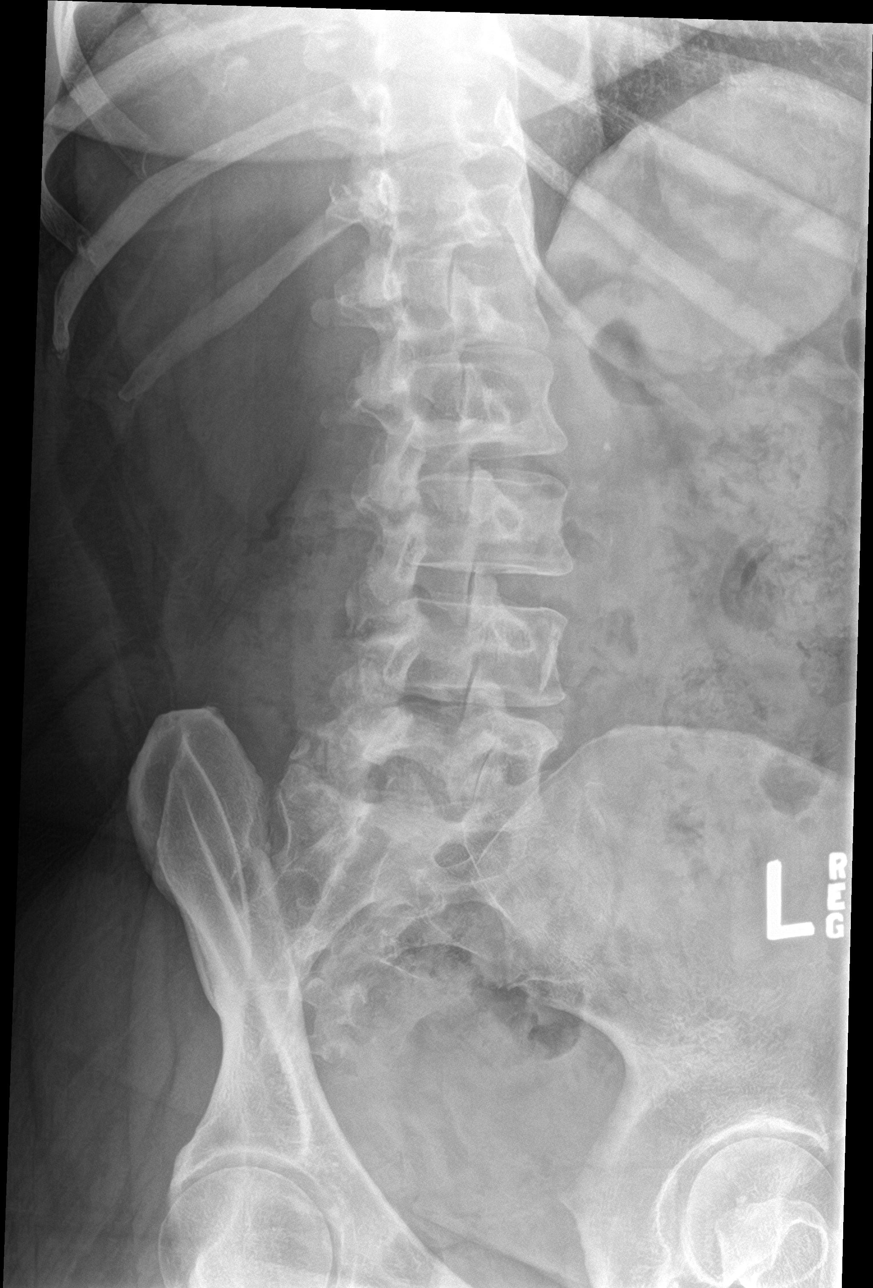

[l-spine lat]
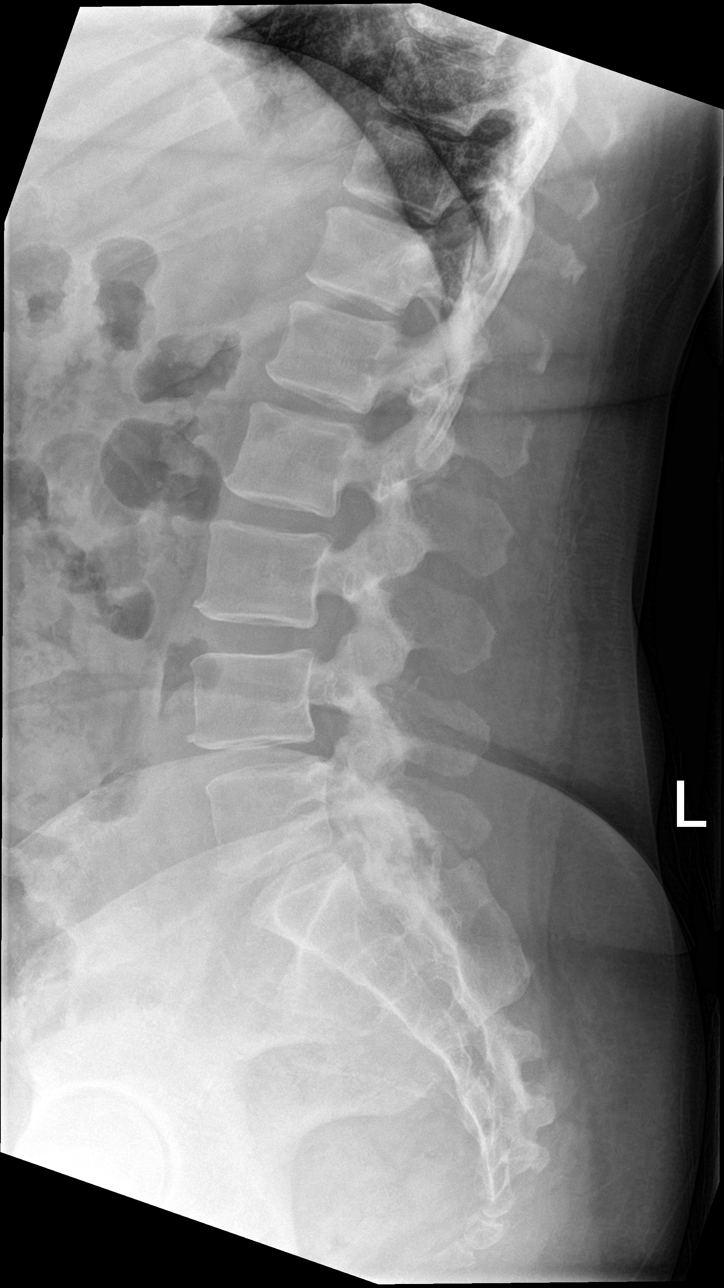

[l-spine spot]
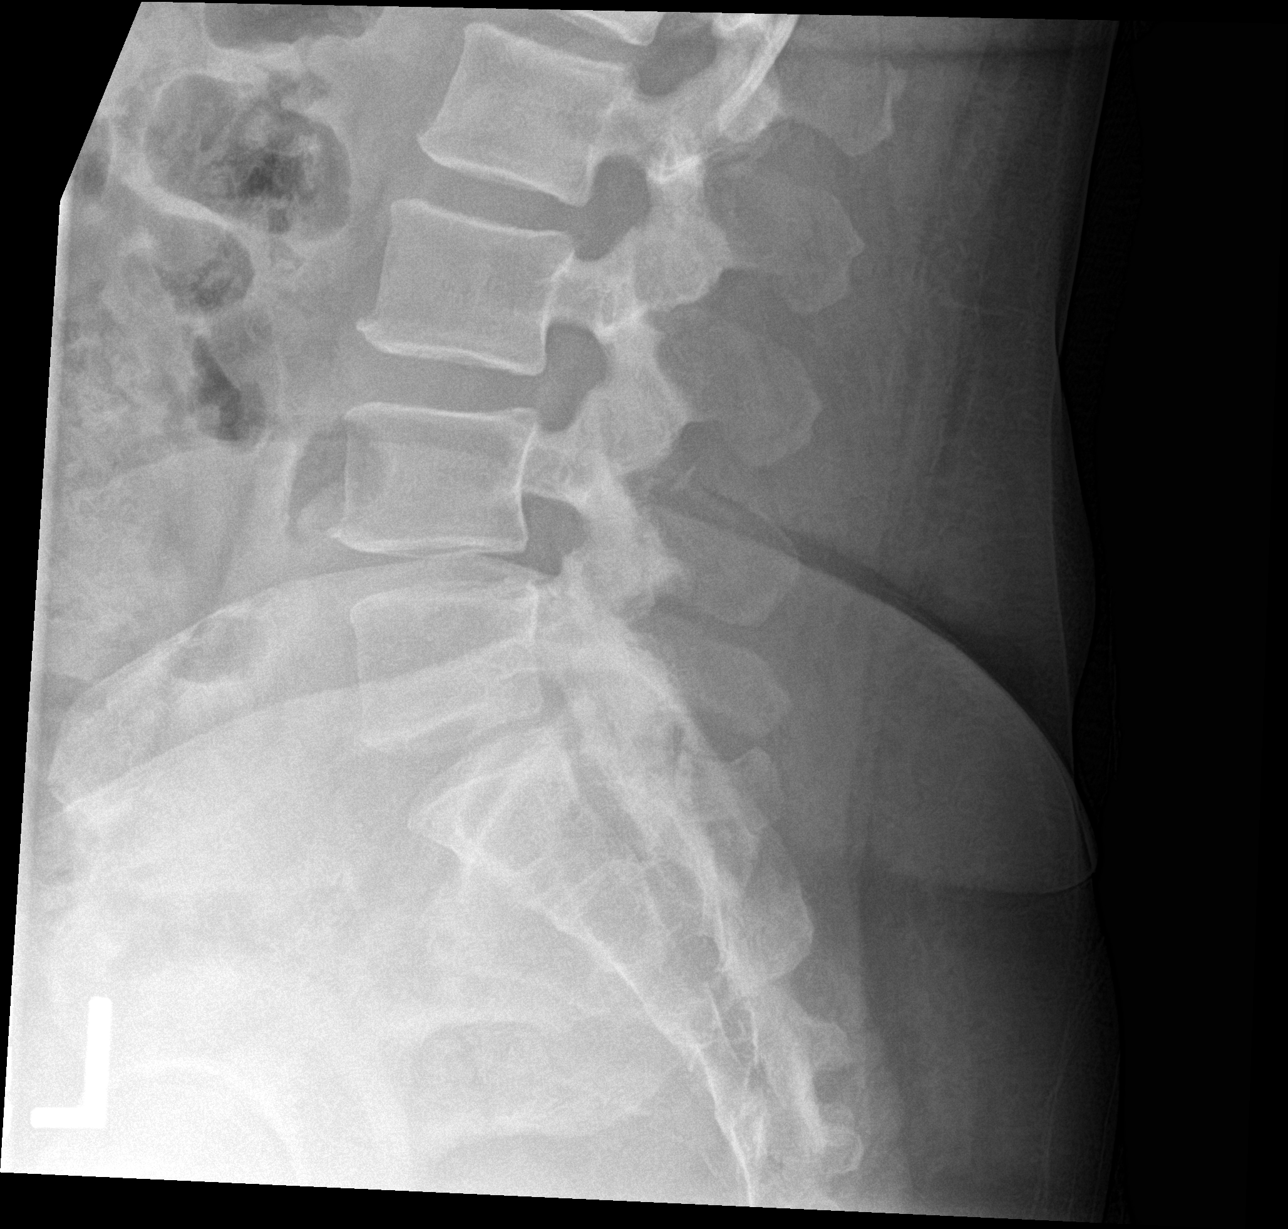

[5 of 5 positions shown; findings below may reference images not displayed]

FINDINGS: No fracture is noted. Minimal grade 1 anterolisthesis of L4-5 is
noted secondary to posterior facet joint hypertrophy. Mild
degenerative disc disease is noted at L4-5. Remaining disc spaces
appear intact.
IMPRESSION: Mild degenerative disc disease is noted at L4-5 with minimal grade 1
anterolisthesis at this level secondary to posterior facet joint
hypertrophy. No acute abnormality is seen in the lumbar spine.

## 2020-01-28 ENCOUNTER — Encounter (HOSPITAL_BASED_OUTPATIENT_CLINIC_OR_DEPARTMENT_OTHER): Payer: Self-pay

## 2020-01-28 ENCOUNTER — Emergency Department (HOSPITAL_BASED_OUTPATIENT_CLINIC_OR_DEPARTMENT_OTHER)
Admission: EM | Admit: 2020-01-28 | Discharge: 2020-01-28 | Disposition: A | Discharge status: Home / Self Care | Payer: Medicaid Other | Attending: Emergency Medicine | Admitting: Emergency Medicine

## 2020-01-28 ENCOUNTER — Other Ambulatory Visit: Payer: Self-pay

## 2020-01-28 ENCOUNTER — Emergency Department (HOSPITAL_BASED_OUTPATIENT_CLINIC_OR_DEPARTMENT_OTHER): Payer: Medicaid Other

## 2020-01-28 DIAGNOSIS — R531 Weakness: Secondary | ICD-10-CM | POA: Diagnosis not present

## 2020-01-28 DIAGNOSIS — R079 Chest pain, unspecified: Secondary | ICD-10-CM | POA: Diagnosis not present

## 2020-01-28 DIAGNOSIS — R110 Nausea: Secondary | ICD-10-CM | POA: Diagnosis not present

## 2020-01-28 DIAGNOSIS — R42 Dizziness and giddiness: Secondary | ICD-10-CM | POA: Insufficient documentation

## 2020-01-28 DIAGNOSIS — R630 Anorexia: Secondary | ICD-10-CM | POA: Diagnosis not present

## 2020-01-28 DIAGNOSIS — R0600 Dyspnea, unspecified: Secondary | ICD-10-CM | POA: Diagnosis not present

## 2020-01-28 DIAGNOSIS — Z5321 Procedure and treatment not carried out due to patient leaving prior to being seen by health care provider: Secondary | ICD-10-CM | POA: Insufficient documentation

## 2020-01-28 LAB — CBC
HCT: 46.3 % — ABNORMAL HIGH (ref 36.0–46.0)
Hemoglobin: 15.4 g/dL — ABNORMAL HIGH (ref 12.0–15.0)
MCH: 28.3 pg (ref 26.0–34.0)
MCHC: 33.3 g/dL (ref 30.0–36.0)
MCV: 85 fL (ref 80.0–100.0)
Platelets: 322 10*3/uL (ref 150–400)
RBC: 5.45 MIL/uL — ABNORMAL HIGH (ref 3.87–5.11)
RDW: 14.4 % (ref 11.5–15.5)
WBC: 8.2 10*3/uL (ref 4.0–10.5)
nRBC: 0 % (ref 0.0–0.2)

## 2020-01-28 LAB — TROPONIN I (HIGH SENSITIVITY): Troponin I (High Sensitivity): 3 ng/L (ref <18)

## 2020-01-28 LAB — BASIC METABOLIC PANEL
Anion gap: 14 (ref 5–15)
BUN: 15 mg/dL (ref 6–20)
CO2: 24 mmol/L (ref 22–32)
Calcium: 9.6 mg/dL (ref 8.9–10.3)
Chloride: 98 mmol/L (ref 98–111)
Creatinine, Ser: 1.52 mg/dL — ABNORMAL HIGH (ref 0.44–1.00)
GFR, Estimated: 41 mL/min — ABNORMAL LOW (ref >60)
Glucose, Bld: 221 mg/dL — ABNORMAL HIGH (ref 70–99)
Potassium: 3.2 mmol/L — ABNORMAL LOW (ref 3.5–5.1)
Sodium: 136 mmol/L (ref 135–145)

## 2020-01-28 LAB — CBG MONITORING, ED: Glucose-Capillary: 205 mg/dL — ABNORMAL HIGH (ref 70–99)

## 2020-01-28 NOTE — ED Triage Notes (Signed)
Pt states has been feeling weak, nauseous chest pain, dyspnea on exertion and dizzy for the past 3 days. Decreased appetite. Vaccinated for COVID with 2 shots. Hasnt been able to check blood sugar the past week because insurance wouldn't allow her to get the freestyle to check it.

## 2020-01-28 NOTE — ED Notes (Signed)
Pt is aware of [place] needing UA

## 2023-05-31 ENCOUNTER — Encounter (HOSPITAL_BASED_OUTPATIENT_CLINIC_OR_DEPARTMENT_OTHER): Payer: Self-pay

## 2023-05-31 ENCOUNTER — Emergency Department (HOSPITAL_BASED_OUTPATIENT_CLINIC_OR_DEPARTMENT_OTHER)
Admission: EM | Admit: 2023-05-31 | Discharge: 2023-05-31 | Disposition: A | Discharge status: Home / Self Care | Payer: Self-pay | Attending: Emergency Medicine | Admitting: Emergency Medicine

## 2023-05-31 ENCOUNTER — Other Ambulatory Visit: Payer: Self-pay

## 2023-05-31 DIAGNOSIS — Z7984 Long term (current) use of oral hypoglycemic drugs: Secondary | ICD-10-CM | POA: Insufficient documentation

## 2023-05-31 DIAGNOSIS — R739 Hyperglycemia, unspecified: Secondary | ICD-10-CM | POA: Diagnosis present

## 2023-05-31 DIAGNOSIS — Z91040 Latex allergy status: Secondary | ICD-10-CM | POA: Diagnosis not present

## 2023-05-31 DIAGNOSIS — E1165 Type 2 diabetes mellitus with hyperglycemia: Secondary | ICD-10-CM | POA: Diagnosis not present

## 2023-05-31 DIAGNOSIS — E1122 Type 2 diabetes mellitus with diabetic chronic kidney disease: Secondary | ICD-10-CM | POA: Insufficient documentation

## 2023-05-31 DIAGNOSIS — Z794 Long term (current) use of insulin: Secondary | ICD-10-CM | POA: Diagnosis not present

## 2023-05-31 DIAGNOSIS — N189 Chronic kidney disease, unspecified: Secondary | ICD-10-CM | POA: Diagnosis not present

## 2023-05-31 LAB — COMPREHENSIVE METABOLIC PANEL WITH GFR
ALT: 11 U/L (ref 0–44)
AST: 16 U/L (ref 15–41)
Albumin: 3.9 g/dL (ref 3.5–5.0)
Alkaline Phosphatase: 129 U/L — ABNORMAL HIGH (ref 38–126)
Anion gap: 10 (ref 5–15)
BUN: 16 mg/dL (ref 6–20)
CO2: 24 mmol/L (ref 22–32)
Calcium: 9.4 mg/dL (ref 8.9–10.3)
Chloride: 100 mmol/L (ref 98–111)
Creatinine, Ser: 1.23 mg/dL — ABNORMAL HIGH (ref 0.44–1.00)
GFR, Estimated: 52 mL/min — ABNORMAL LOW (ref >60)
Glucose, Bld: 532 mg/dL (ref 70–99)
Potassium: 4.3 mmol/L (ref 3.5–5.1)
Sodium: 134 mmol/L — ABNORMAL LOW (ref 135–145)
Total Bilirubin: <0.2 mg/dL (ref 0.0–1.2)
Total Protein: 6.9 g/dL (ref 6.5–8.1)

## 2023-05-31 LAB — CBC WITH DIFFERENTIAL/PLATELET
Abs Immature Granulocytes: 0.03 10*3/uL (ref 0.00–0.07)
Basophils Absolute: 0 10*3/uL (ref 0.0–0.1)
Basophils Relative: 1 %
Eosinophils Absolute: 0.3 10*3/uL (ref 0.0–0.5)
Eosinophils Relative: 4 %
HCT: 40.6 % (ref 36.0–46.0)
Hemoglobin: 12.9 g/dL (ref 12.0–15.0)
Immature Granulocytes: 0 %
Lymphocytes Relative: 34 %
Lymphs Abs: 2.8 10*3/uL (ref 0.7–4.0)
MCH: 27.8 pg (ref 26.0–34.0)
MCHC: 31.8 g/dL (ref 30.0–36.0)
MCV: 87.5 fL (ref 80.0–100.0)
Monocytes Absolute: 0.6 10*3/uL (ref 0.1–1.0)
Monocytes Relative: 8 %
Neutro Abs: 4.5 10*3/uL (ref 1.7–7.7)
Neutrophils Relative %: 53 %
Platelets: 290 10*3/uL (ref 150–400)
RBC: 4.64 MIL/uL (ref 3.87–5.11)
RDW: 14.2 % (ref 11.5–15.5)
WBC: 8.4 10*3/uL (ref 4.0–10.5)
nRBC: 0 % (ref 0.0–0.2)

## 2023-05-31 LAB — CBG MONITORING, ED
Glucose-Capillary: 599 mg/dL (ref 70–99)
Glucose-Capillary: 335 mg/dL — ABNORMAL HIGH (ref 70–99)
Glucose-Capillary: 379 mg/dL — ABNORMAL HIGH (ref 70–99)
Glucose-Capillary: 178 mg/dL — ABNORMAL HIGH (ref 70–99)

## 2023-05-31 LAB — LIPASE, BLOOD: Lipase: 42 U/L (ref 11–51)

## 2023-05-31 LAB — URINALYSIS, ROUTINE W REFLEX MICROSCOPIC
Bilirubin Urine: NEGATIVE
Glucose, UA: >=500 mg/dL — AB
Hgb urine dipstick: NEGATIVE
Ketones, ur: NEGATIVE mg/dL
Leukocytes,Ua: NEGATIVE
Nitrite: NEGATIVE
Protein, ur: NEGATIVE mg/dL
Specific Gravity, Urine: <=1.005 (ref 1.005–1.030)
pH: 5.5 (ref 5.0–8.0)

## 2023-05-31 LAB — URINALYSIS, MICROSCOPIC (REFLEX)

## 2023-05-31 LAB — OSMOLALITY: Osmolality: 316 mosm/kg — ABNORMAL HIGH (ref 275–295)

## 2023-05-31 MED ORDER — DEXTROSE 50 % IV SOLN: 0.0000 mL | INTRAVENOUS | Status: DC | PRN

## 2023-05-31 MED ORDER — LACTATED RINGERS IV SOLN
INTRAVENOUS | Status: DC
Start: 2023-05-31 — End: 2023-06-01

## 2023-05-31 MED ORDER — INSULIN ASPART PROT & ASPART (70-30 MIX) 100 UNIT/ML ~~LOC~~ SUSP
10.0000 [IU] | Freq: Once | SUBCUTANEOUS | Status: AC
  Administered 2023-05-31: 10 [IU] via SUBCUTANEOUS
  Filled 2023-05-31: qty 10

## 2023-05-31 MED ORDER — LACTATED RINGERS IV BOLUS
20.0000 mL/kg | Freq: Once | INTRAVENOUS | Status: AC
  Administered 2023-05-31: 1632 mL via INTRAVENOUS

## 2023-05-31 MED ORDER — INSULIN REGULAR HUMAN 100 UNIT/ML IJ SOLN
10.0000 [IU] | Freq: Once | INTRAMUSCULAR | Status: DC
  Filled 2023-05-31: qty 3

## 2023-05-31 MED ORDER — DEXTROSE IN LACTATED RINGERS 5 % IV SOLN
INTRAVENOUS | Status: DC
Start: 2023-05-31 — End: 2023-06-01

## 2023-05-31 NOTE — ED Notes (Signed)
 ED Provider at bedside.

## 2023-05-31 NOTE — ED Notes (Addendum)
 Pt presents with elevated blood glucose ( BG was in 600 however has decreased with treatment) . EDP at bedside. Last BG 178. A&O x 4 .

## 2023-05-31 NOTE — Discharge Instructions (Signed)
 You were seen in the emergency department for elevated blood sugars We gave you fluids and insulin  here and your blood glucose improved You need to call Dr. Lydia Sams tomorrow so you can start back on insulin  and be seen in the office for follow-up Continue taking all previous prescribed medications Return to the emergency department for severely elevated blood sugars or any other concerns

## 2023-05-31 NOTE — ED Triage Notes (Signed)
 Reports fatigue, increased thirst since yesterday morning. States blood sugar was 400+ at home today.

## 2023-05-31 NOTE — ED Provider Notes (Signed)
 [place] [facility] Provider Note   CSN: [id number] Arrival date & time: 05/31/23  1455     History  Chief Complaint  Patient presents with   Hyperglycemia    Renee James is a 53 y.o. female.With a past medical history of type 2 diabetes on insulin , CKD and pancreatitis who presents to the ED for hyperglycemia.  Patient mentions that she was previously on insulin  and had a continuous glucose monitor in place but "took herself off of the insulin " after she lost her insurance over a year ago.  She has not followed up with her endocrinologist since regaining her insurance.  Does not check her blood glucose at home.  For last few days she has been feeling weak, fatigued with increased thirst.  Her husband made her come to the ED for further evaluation today.  No chest pain, shortness of breath nausea vomiting fevers or chills   Hyperglycemia      Home Medications Prior to Admission medications   Medication Sig Start Date End Date Taking? Authorizing Provider  albuterol  (PROVENTIL  HFA;VENTOLIN  HFA) 108 (90 Base) MCG/ACT inhaler Inhale 1-2 puffs into the lungs every 6 (six) hours as needed for wheezing or shortness of breath. 08/29/16   Copland, Skipper Dumas, MD  ALPRAZolam  (XANAX ) 0.25 MG tablet Take 1 tablet (0.25 mg total) by mouth at bedtime as needed for anxiety or sleep. 08/21/17   Copland, Jessica C, MD  amoxicillin -clavulanate (AUGMENTIN ) 875-125 MG tablet Take 1 tablet by mouth 2 (two) times daily. One po bid x 7 days 10/30/18   Mesner, Reymundo Caulk, MD  citalopram  (CELEXA ) 20 MG tablet Take 1 tablet (20 mg total) by mouth daily. 06/14/17   Copland, Skipper Dumas, MD  dexamethasone  (DECADRON ) 4 MG tablet Take 1 tablet (4 mg total) by mouth 2 (two) times daily. 10/24/18   Bart Born, MD  EPINEPHrine  0.3 mg/0.3 mL IJ SOAJ injection Inject 0.3 mLs (0.3 mg total) into the muscle as needed for anaphylaxis. 10/24/18   Bart Born, MD  ergocalciferol (VITAMIN  D2) 50000 units capsule Take 50,000 Units by mouth every Monday.    [provider]  famotidine  (PEPCID ) 20 MG tablet Take 1 tablet (20 mg total) by mouth 2 (two) times daily. 08/16/17   Silva Zapata, Edwin, MD  glipiZIDE  (GLUCOTROL ) 5 MG tablet Take 1 tablet (5 mg total) by mouth 2 (two) times daily before a meal. 03/30/18   Copland, Skipper Dumas, MD  HYDROcodone -acetaminophen  (NORCO/VICODIN) 5-325 MG tablet Take 1 tablet by mouth every 6 (six) hours as needed for moderate pain. 03/19/19   Zackowski, Scott, MD  Insulin  Detemir (LEVEMIR ) 100 UNIT/ML Pen Inject 24 units twice daily 04/04/18   Copland, Jessica C, MD  Lancets [facility] ULTRASOFT) lancets USE AS DIRECTED TO TEST BLOOD SUGARS DAILY DX: E11.9 03/07/16   Saguier, Gaylin Ke, PA-C  lidocaine  (LIDODERM ) 5 % Place 1 patch onto the skin daily. Remove & Discard patch within 12 hours or as directed by MD 03/19/18   Martie Slaughter, PA-C  losartan  (COZAAR ) 100 MG tablet Take 1 tablet (100 mg total) by mouth daily. 05/25/18   Copland, Skipper Dumas, MD  magic mouthwash w/lidocaine  SOLN Take 5 mLs by mouth 3 (three) times daily for 7 days. 10/30/18 11/06/18  Mesner, Reymundo Caulk, MD  metFORMIN  (GLUCOPHAGE -XR) 500 MG 24 hr tablet Take 4 tablets (2,000 mg total) by mouth daily. 07/15/16   Copland, Skipper Dumas, MD  methocarbamol  (ROBAXIN ) 500 MG tablet Take 1 tablet (500 mg  total) by mouth every 8 (eight) hours as needed for muscle spasms. 06/05/18   Copland, Skipper Dumas, MD  oxyCODONE -acetaminophen  (PERCOCET/ROXICET) 5-325 MG tablet Take 1-2 tablets by mouth every 6 (six) hours as needed for severe pain. 04/04/18   Copland, Skipper Dumas, MD  pioglitazone  (ACTOS ) 30 MG tablet Take 30 mg by mouth daily.    [provider]  polyethylene glycol (MIRALAX  / GLYCOLAX ) 17 g packet Take 17 g by mouth daily. 06/05/18   Copland, Skipper Dumas, MD  pregabalin  (LYRICA ) 75 MG capsule Take 1 capsule (75 mg total) by mouth daily as needed (for pain). 05/25/18   Copland, Skipper Dumas, MD   rosuvastatin  (CRESTOR ) 20 MG tablet Take 1 tablet (20 mg total) by mouth daily. 05/08/19   Copland, Skipper Dumas, MD  torsemide  (DEMADEX ) 20 MG tablet Take 0.5-1 tablets (10-20 mg total) by mouth daily. 06/05/18   Copland, Skipper Dumas, MD  traMADol  (ULTRAM ) 50 MG tablet TAKE 1 TABLET BY MOUTH EVERY 12 HOURS AS NEEDED FOR  ARTHRITIS  PAIN.  CAN  CAUSE  SEDATION 02/27/18   Copland, Skipper Dumas, MD      Allergies    Latex, Talc, Bupropion , Topiramate, Ace inhibitors, and Other    Review of Systems   Review of Systems  Physical Exam Updated Vital Signs BP 115/67   Pulse 69   Temp 98.4 F (36.9 C) (Oral)   Resp 15   Ht 5\' 2"  (1.575 m)   Wt 81.6 kg   LMP 05/09/2017 (LMP Unknown)   SpO2 98%   BMI 32.92 kg/m  Physical Exam Vitals and nursing note reviewed.  HENT:     Head: Normocephalic and atraumatic.  Eyes:     Pupils: Pupils are equal, round, and reactive to light.  Cardiovascular:     Rate and Rhythm: Normal rate and regular rhythm.  Pulmonary:     Effort: Pulmonary effort is normal.     Breath sounds: Normal breath sounds.  Abdominal:     Palpations: Abdomen is soft.     Tenderness: There is no abdominal tenderness.  Skin:    General: Skin is warm and dry.  Neurological:     Mental Status: She is alert.  Psychiatric:        Mood and Affect: Mood normal.     ED Results / Procedures / Treatments   Labs (all labs ordered are listed, but only abnormal results are displayed) Labs Reviewed  URINALYSIS, ROUTINE W REFLEX MICROSCOPIC - Abnormal; Notable for the following components:      Result Value   APPearance HAZY (*)    Glucose, UA >=500 (*)    All other components within normal limits  OSMOLALITY - Abnormal; Notable for the following components:   Osmolality 316 (*)    All other components within normal limits  COMPREHENSIVE METABOLIC PANEL WITH GFR - Abnormal; Notable for the following components:   Sodium 134 (*)    Glucose, Bld 532 (*)    Creatinine, Ser 1.23 (*)     Alkaline Phosphatase 129 (*)    GFR, Estimated 52 (*)    All other components within normal limits  URINALYSIS, MICROSCOPIC (REFLEX) - Abnormal; Notable for the following components:   Bacteria, UA RARE (*)    All other components within normal limits  CBG MONITORING, ED - Abnormal; Notable for the following components:   Glucose-Capillary 599 (*)    All other components within normal limits  CBG MONITORING, ED - Abnormal; Notable for the following  components:   Glucose-Capillary 379 (*)    All other components within normal limits  CBG MONITORING, ED - Abnormal; Notable for the following components:   Glucose-Capillary 335 (*)    All other components within normal limits  CBG MONITORING, ED - Abnormal; Notable for the following components:   Glucose-Capillary 178 (*)    All other components within normal limits  CBC WITH DIFFERENTIAL/PLATELET  LIPASE, BLOOD  CBG MONITORING, ED    EKG EKG Interpretation Date/Time:  Wednesday May 31 2023 16:03:50 EDT Ventricular Rate:  72 PR Interval:  132 QRS Duration:  98 QT Interval:  415 QTC Calculation: 455 R Axis:   8  Text Interpretation: Sinus rhythm Confirmed by Rafael Bun [phone]) on 05/31/2023 4:14:54 PM  Radiology No results found.  Procedures Procedures    Medications Ordered in ED Medications  lactated ringers  infusion ( Intravenous New Bag/Given 05/31/23 1626)  dextrose  5 % in lactated ringers  infusion (has no administration in time range)  dextrose  50 % solution 0-50 mL (has no administration in time range)  lactated ringers  bolus 1,632 mL (0 mLs Intravenous Stopped 05/31/23 1634)  insulin  aspart protamine- aspart (NOVOLOG  MIX 70/30) injection 10 Units (10 Units Subcutaneous Given 05/31/23 1652)    ED Course/ Medical Decision Making/ A&P Clinical Course as of 05/31/23 1957  Wed May 31, 2023  1703 Laboratory workup reveals hyperglycemia glucose urea.  Renal function at baseline.  No significant electrolyte  derangement.  Reevaluated patient.  Blood glucose downtrending to 379 after fluids and subcutaneous insulin .  Will continue to monitor and repeat CBG [MP]  1954 Repeat blood glucose now in the 100s.  Patient feeling much better.  She will call her endocrinologist (Dr. Lydia Sams) tomorrow to get started back on insulin .  She has her p.o. diabetes medications at home.  Return precautions will be worrisome for severe HHS/DKA were discussed [MP]    Clinical Course User Index [MP] Sallyanne Creamer, DO                                 Medical Decision Making 53 year old female with history as above presenting for hyperglycemia in the setting of uncontrolled type 2 diabetes.  Has been noncompliant with her medications for over a year citing the loss of her insurance as a primary reason.  3 days of increased fatigue generalized weakness and thirst.  Does not check blood glucose at home.  No fevers chills or recent illness.  Most likely cause of hyperglycemia would be medication noncompliance.  No evidence of acute infectious cause.  Low suspicion for DKA at this time.  More likely HHS.  Will obtain laboratory workup including CBC CMP lipase UA provide IV fluids for rehydration.  May require potassium repletion after IV fluids have been given and insulin  therapy after fluid bolus as well.  Amount and/or Complexity of Data Reviewed Labs: ordered.  Risk Prescription drug management.           Final Clinical Impression(s) / ED Diagnoses Final diagnoses:  Type 2 diabetes mellitus with hyperglycemia, unspecified whether long term insulin  use [facility])    Rx / DC Orders ED Discharge Orders     None         Sallyanne Creamer, DO 05/31/23 1957

## 2023-08-05 ENCOUNTER — Emergency Department (HOSPITAL_BASED_OUTPATIENT_CLINIC_OR_DEPARTMENT_OTHER)
Admission: EM | Admit: 2023-08-05 | Discharge: 2023-08-05 | Disposition: A | Discharge status: Home / Self Care | Attending: Emergency Medicine | Admitting: Emergency Medicine

## 2023-08-05 ENCOUNTER — Other Ambulatory Visit: Payer: Self-pay

## 2023-08-05 ENCOUNTER — Encounter (HOSPITAL_BASED_OUTPATIENT_CLINIC_OR_DEPARTMENT_OTHER): Payer: Self-pay | Admitting: Emergency Medicine

## 2023-08-05 DIAGNOSIS — Z794 Long term (current) use of insulin: Secondary | ICD-10-CM | POA: Diagnosis not present

## 2023-08-05 DIAGNOSIS — Z91040 Latex allergy status: Secondary | ICD-10-CM | POA: Diagnosis not present

## 2023-08-05 DIAGNOSIS — M546 Pain in thoracic spine: Secondary | ICD-10-CM | POA: Diagnosis present

## 2023-08-05 DIAGNOSIS — M549 Dorsalgia, unspecified: Secondary | ICD-10-CM

## 2023-08-05 DIAGNOSIS — Z7984 Long term (current) use of oral hypoglycemic drugs: Secondary | ICD-10-CM | POA: Insufficient documentation

## 2023-08-05 MED ORDER — OXYCODONE-ACETAMINOPHEN 5-325 MG PO TABS
1.0000 | ORAL_TABLET | Freq: Once | ORAL | Status: DC
  Filled 2023-08-05: qty 1

## 2023-08-05 MED ORDER — KETOROLAC TROMETHAMINE 15 MG/ML IJ SOLN
15.0000 mg | Freq: Once | INTRAMUSCULAR | Status: AC
  Administered 2023-08-05: 15 mg via INTRAMUSCULAR
  Filled 2023-08-05: qty 1

## 2023-08-05 MED ORDER — CYCLOBENZAPRINE HCL 10 MG PO TABS: 10.0000 mg | ORAL_TABLET | Freq: Two times a day (BID) | ORAL | 0 refills | Status: AC | PRN

## 2023-08-05 MED ORDER — LIDOCAINE 5 % EX PTCH
1.0000 | MEDICATED_PATCH | CUTANEOUS | Status: DC
  Administered 2023-08-05: 1 via TRANSDERMAL
  Filled 2023-08-05: qty 1

## 2023-08-05 MED ORDER — LIDOCAINE 5 % EX PTCH
1.0000 | MEDICATED_PATCH | CUTANEOUS | 0 refills | Status: AC
Start: 2023-08-05 — End: ?

## 2023-08-05 MED ORDER — OXYCODONE-ACETAMINOPHEN 5-325 MG PO TABS
2.0000 | ORAL_TABLET | Freq: Once | ORAL | Status: AC
  Administered 2023-08-05: 1 via ORAL
  Filled 2023-08-05: qty 2

## 2023-08-05 NOTE — Discharge Instructions (Signed)
 As we discussed, this is likely just muscular spasms.  I have sent two medications to your pharmacy.  Flexeril  and lidocaine  patches.  You can continue taking 600 mg of ibuprofen  every 6 hours as needed for pain.  I would start doing some light stretching exercises and including some rotational exercises and flexion and extension to get your back loose.  I would like for you to follow-up with your primary care doctor for further evaluation.  You may return to the emergency department for any worsening symptoms.

## 2023-08-05 NOTE — ED Notes (Signed)
 Primary RN went to give pt percocet as ordered by EDP. 1 tablet of the 2 tablets ordered was dropped in pt room. 1 tablet administered to pt, the other was wasted in pyxis. EDP notified, EDP ordered another tablet of percocet. Pt was given new ordered tablet. Total of 2 tablets of percocet was given per the original order from EDP.

## 2023-08-05 NOTE — ED Triage Notes (Signed)
 Pt c/o mid-low back pain, more on the RT, since Monday after twisting wrong while carrying groceries; feels like she is having spasms now

## 2023-08-05 NOTE — ED Provider Notes (Signed)
 [place] [facility] Provider Note   CSN: [id number] Arrival date & time: 08/05/23  1216     Patient presents with: Back Pain   Renee James is a 53 y.o. female patient who presents to the emergency department today for further evaluation of lower mid right-sided back pain.  Patient states on Monday she was trying to reach over into the passenger seat to grab some groceries with the right hand and upon getting out of the car she got caught against the steering wheel.  Since then she has been having some spasm sensations in the right mid lower back.  She denies any weakness or numbness to her arms or legs.  No bowel or bladder incontinence.    Back Pain      Prior to Admission medications   Medication Sig Start Date End Date Taking? Authorizing Provider  cyclobenzaprine  (FLEXERIL ) 10 MG tablet Take 1 tablet (10 mg total) by mouth 2 (two) times daily as needed for muscle spasms. 08/05/23  Yes Danzig Macgregor M, PA-C  lidocaine  (LIDODERM ) 5 % Place 1 patch onto the skin daily. Remove & Discard patch within 12 hours or as directed by MD 08/05/23  Yes Theotis, Ho Parisi M, PA-C  albuterol  (PROVENTIL  HFA;VENTOLIN  HFA) 108 (90 Base) MCG/ACT inhaler Inhale 1-2 puffs into the lungs every 6 (six) hours as needed for wheezing or shortness of breath. 08/29/16   Copland, Harlene BROCKS, MD  ALPRAZolam  (XANAX ) 0.25 MG tablet Take 1 tablet (0.25 mg total) by mouth at bedtime as needed for anxiety or sleep. 08/21/17   Copland, Jessica C, MD  amoxicillin -clavulanate (AUGMENTIN ) 875-125 MG tablet Take 1 tablet by mouth 2 (two) times daily. One po bid x 7 days 10/30/18   Mesner, Selinda, MD  citalopram  (CELEXA ) 20 MG tablet Take 1 tablet (20 mg total) by mouth daily. 06/14/17   Copland, Harlene BROCKS, MD  dexamethasone  (DECADRON ) 4 MG tablet Take 1 tablet (4 mg total) by mouth 2 (two) times daily. 10/24/18   Loetta Senior, MD  EPINEPHrine  0.3 mg/0.3 mL IJ SOAJ injection Inject 0.3 mLs (0.3  mg total) into the muscle as needed for anaphylaxis. 10/24/18   Loetta Senior, MD  ergocalciferol (VITAMIN D2) 50000 units capsule Take 50,000 Units by mouth every Monday.    [provider]  famotidine  (PEPCID ) 20 MG tablet Take 1 tablet (20 mg total) by mouth 2 (two) times daily. 08/16/17   Nikki Hansel Atlas, MD  glipiZIDE  (GLUCOTROL ) 5 MG tablet Take 1 tablet (5 mg total) by mouth 2 (two) times daily before a meal. 03/30/18   Copland, Harlene BROCKS, MD  HYDROcodone -acetaminophen  (NORCO/VICODIN) 5-325 MG tablet Take 1 tablet by mouth every 6 (six) hours as needed for moderate pain. 03/19/19   Zackowski, Scott, MD  Insulin  Detemir (LEVEMIR ) 100 UNIT/ML Pen Inject 24 units twice daily 04/04/18   Copland, Jessica C, MD  Lancets [facility] ULTRASOFT) lancets USE AS DIRECTED TO TEST BLOOD SUGARS DAILY DX: E11.9 03/07/16   Saguier, [place], PA-C  losartan  (COZAAR ) 100 MG tablet Take 1 tablet (100 mg total) by mouth daily. 05/25/18   Copland, Harlene BROCKS, MD  magic mouthwash w/lidocaine  SOLN Take 5 mLs by mouth 3 (three) times daily for 7 days. 10/30/18 11/06/18  Mesner, Selinda, MD  metFORMIN  (GLUCOPHAGE -XR) 500 MG 24 hr tablet Take 4 tablets (2,000 mg total) by mouth daily. 07/15/16   Copland, Harlene BROCKS, MD  oxyCODONE -acetaminophen  (PERCOCET/ROXICET) 5-325 MG tablet Take 1-2 tablets by mouth every 6 (six)  hours as needed for severe pain. 04/04/18   Copland, Harlene BROCKS, MD  pioglitazone  (ACTOS ) 30 MG tablet Take 30 mg by mouth daily.    [provider]  polyethylene glycol (MIRALAX  / GLYCOLAX ) 17 g packet Take 17 g by mouth daily. 06/05/18   Copland, Harlene BROCKS, MD  pregabalin  (LYRICA ) 75 MG capsule Take 1 capsule (75 mg total) by mouth daily as needed (for pain). 05/25/18   Copland, Harlene BROCKS, MD  rosuvastatin  (CRESTOR ) 20 MG tablet Take 1 tablet (20 mg total) by mouth daily. 05/08/19   Copland, Harlene BROCKS, MD  torsemide  (DEMADEX ) 20 MG tablet Take 0.5-1 tablets (10-20 mg total) by mouth daily. 06/05/18   Copland,  Harlene BROCKS, MD  traMADol  (ULTRAM ) 50 MG tablet TAKE 1 TABLET BY MOUTH EVERY 12 HOURS AS NEEDED FOR  ARTHRITIS  PAIN.  CAN  CAUSE  SEDATION 02/27/18   Copland, Harlene BROCKS, MD    Allergies: Latex, Talc, Bupropion , Topiramate, Ace inhibitors, and Other    Review of Systems  Musculoskeletal:  Positive for back pain.  All other systems reviewed and are negative.   Updated Vital Signs BP (!) 146/88 (BP Location: Right Arm)   Pulse 75   Temp 97.9 F (36.6 C) (Skin)   Resp 14   Ht 5' 3 (1.6 m)   Wt 83.9 kg   LMP 05/09/2017 (LMP Unknown)   SpO2 97%   BMI 32.77 kg/m   Physical Exam Vitals and nursing note reviewed.  Constitutional:      Appearance: Normal appearance.  HENT:     Head: Normocephalic and atraumatic.  Eyes:     General:        Right eye: No discharge.        Left eye: No discharge.     Conjunctiva/sclera: Conjunctivae normal.  Pulmonary:     Effort: Pulmonary effort is normal.  Musculoskeletal:     Comments: 5/5 Strength to the upper and lower extremities. Normal sensation to the upper and lower extremities.   Skin:    General: Skin is warm and dry.     Findings: No rash.  Neurological:     General: No focal deficit present.     Mental Status: She is alert.  Psychiatric:        Mood and Affect: Mood normal.        Behavior: Behavior normal.     (all labs ordered are listed, but only abnormal results are displayed) Labs Reviewed - No data to display  EKG: None  Radiology: No results found.   Procedures   Medications Ordered in the ED  lidocaine  (LIDODERM ) 5 % 1 patch (1 patch Transdermal Patch Applied 08/05/23 1430)  oxyCODONE -acetaminophen  (PERCOCET/ROXICET) 5-325 MG per tablet 1 tablet (1 tablet Oral See Procedure Record 08/05/23 1557)  ketorolac  (TORADOL ) 15 MG/ML injection 15 mg (15 mg Intramuscular Given 08/05/23 1432)  oxyCODONE -acetaminophen  (PERCOCET/ROXICET) 5-325 MG per tablet 2 tablet (1 tablet Oral Given 08/05/23 1548)     Medical Decision  Making Renee James is a 53 y.o. female patient who presents to the emergency department today for further evaluation of mid back pain.  Low suspicion for traumatic or acute findings in the spine.  She has no midline tenderness.  This is likely just spasm from the twisting motion that she was doing grabbing the groceries.  Will treat conservatively with some Toradol , Percocet, and lidocaine  patches.  Patient feeling slightly better.  Will send her home with some Flexeril  and lidocaine  patches.  Will have her take 600 mg of ibuprofen  every 6 hours as needed for pain.  Strict return precautions were discussed.  I will have her follow-up with her primary care doctor.  She is safer discharge at this time.   Risk Prescription drug management.     Final diagnoses:  Mid back pain on right side    ED Discharge Orders          Ordered    cyclobenzaprine  (FLEXERIL ) 10 MG tablet  2 times daily PRN        08/05/23 1629    lidocaine  (LIDODERM ) 5 %  Every 24 hours        08/05/23 1629               Theotis Peers [place], [place] 08/05/23 1632    Bari Roxie HERO, DO 08/14/23 1530
